# Patient Record
Sex: Male | Born: 1970 | Race: Black or African American | Hispanic: No | Marital: Single | Smoking: Current every day smoker
Health system: Southern US, Community
[De-identification: ages and names within clinical notes are randomized; demographics above are authoritative.]

## PROBLEM LIST (undated history)

## (undated) DIAGNOSIS — K219 Gastro-esophageal reflux disease without esophagitis: Secondary | ICD-10-CM

## (undated) DIAGNOSIS — E7800 Pure hypercholesterolemia, unspecified: Secondary | ICD-10-CM

## (undated) DIAGNOSIS — Z803 Family history of malignant neoplasm of breast: Secondary | ICD-10-CM

## (undated) DIAGNOSIS — E119 Type 2 diabetes mellitus without complications: Secondary | ICD-10-CM

## (undated) DIAGNOSIS — Z800 Family history of malignant neoplasm of digestive organs: Secondary | ICD-10-CM

## (undated) DIAGNOSIS — I10 Essential (primary) hypertension: Secondary | ICD-10-CM

## (undated) DIAGNOSIS — Z8042 Family history of malignant neoplasm of prostate: Secondary | ICD-10-CM

## (undated) DIAGNOSIS — M069 Rheumatoid arthritis, unspecified: Secondary | ICD-10-CM

## (undated) HISTORY — DX: Gastro-esophageal reflux disease without esophagitis: K21.9

## (undated) HISTORY — DX: Rheumatoid arthritis, unspecified: M06.9

## (undated) HISTORY — DX: Family history of malignant neoplasm of digestive organs: Z80.0

## (undated) HISTORY — DX: Family history of malignant neoplasm of prostate: Z80.42

## (undated) HISTORY — DX: Pure hypercholesterolemia, unspecified: E78.00

## (undated) HISTORY — PX: TOE AMPUTATION: SHX809

## (undated) HISTORY — DX: Family history of malignant neoplasm of breast: Z80.3

---

## 2004-07-25 ENCOUNTER — Emergency Department (HOSPITAL_COMMUNITY): Admission: EM | Admit: 2004-07-25 | Discharge: 2004-07-25 | Payer: Self-pay | Admitting: Emergency Medicine

## 2010-04-05 ENCOUNTER — Inpatient Hospital Stay: Payer: Self-pay | Admitting: Internal Medicine

## 2010-05-08 ENCOUNTER — Ambulatory Visit: Payer: Self-pay | Admitting: Nurse Practitioner

## 2010-05-08 ENCOUNTER — Encounter (INDEPENDENT_AMBULATORY_CARE_PROVIDER_SITE_OTHER): Payer: Self-pay | Admitting: Nurse Practitioner

## 2010-05-08 DIAGNOSIS — E119 Type 2 diabetes mellitus without complications: Secondary | ICD-10-CM | POA: Insufficient documentation

## 2010-05-08 DIAGNOSIS — I10 Essential (primary) hypertension: Secondary | ICD-10-CM | POA: Insufficient documentation

## 2010-05-08 DIAGNOSIS — E669 Obesity, unspecified: Secondary | ICD-10-CM | POA: Insufficient documentation

## 2010-05-08 LAB — CONVERTED CEMR LAB
Bilirubin Urine: NEGATIVE
Blood Glucose, Fingerstick: 85
Hgb A1c MFr Bld: 9.5 %
Specific Gravity, Urine: 1.01
Urobilinogen, UA: 0.2
WBC Urine, dipstick: NEGATIVE

## 2010-05-12 LAB — CONVERTED CEMR LAB
ALT: 29 units/L (ref 0–53)
AST: 17 units/L (ref 0–37)
Albumin: 4.4 g/dL (ref 3.5–5.2)
Alkaline Phosphatase: 129 units/L — ABNORMAL HIGH (ref 39–117)
Basophils Absolute: 0 10*3/uL (ref 0.0–0.1)
Basophils Relative: 0 % (ref 0–1)
Calcium: 9.5 mg/dL (ref 8.4–10.5)
Chloride: 103 meq/L (ref 96–112)
MCHC: 32.9 g/dL (ref 30.0–36.0)
Neutro Abs: 5.8 10*3/uL (ref 1.7–7.7)
Neutrophils Relative %: 60 % (ref 43–77)
Platelets: 358 10*3/uL (ref 150–400)
Potassium: 4.1 meq/L (ref 3.5–5.3)
RBC: 4.93 M/uL (ref 4.22–5.81)
RDW: 13.8 % (ref 11.5–15.5)
Sodium: 140 meq/L (ref 135–145)

## 2010-05-25 ENCOUNTER — Emergency Department: Payer: Self-pay | Admitting: Emergency Medicine

## 2010-06-05 ENCOUNTER — Ambulatory Visit
Admission: RE | Admit: 2010-06-05 | Discharge: 2010-06-05 | Payer: Self-pay | Source: Home / Self Care | Attending: Nurse Practitioner | Admitting: Nurse Practitioner

## 2010-06-05 DIAGNOSIS — F17200 Nicotine dependence, unspecified, uncomplicated: Secondary | ICD-10-CM | POA: Insufficient documentation

## 2010-06-05 LAB — CONVERTED CEMR LAB
Blood in Urine, dipstick: NEGATIVE
Protein, U semiquant: NEGATIVE
Urobilinogen, UA: 0.2
WBC Urine, dipstick: NEGATIVE

## 2010-06-06 ENCOUNTER — Ambulatory Visit
Admission: RE | Admit: 2010-06-06 | Discharge: 2010-06-06 | Payer: Self-pay | Source: Home / Self Care | Attending: Nurse Practitioner | Admitting: Nurse Practitioner

## 2010-06-06 ENCOUNTER — Encounter (INDEPENDENT_AMBULATORY_CARE_PROVIDER_SITE_OTHER): Payer: Self-pay | Admitting: Nurse Practitioner

## 2010-06-22 NOTE — Letter (Signed)
Summary: NUTRITION SUMMARY/SUSIE  NUTRITION SUMMARY/SUSIE   Imported By: Arta Bruce 06/16/2010 14:56:38  _____________________________________________________________________  External Attachment:    Type:   Image     Comment:   External Document

## 2010-06-22 NOTE — Assessment & Plan Note (Signed)
Summary: Diabetes/HTN   Vital Signs:  Patient profile:   40 year old male Weight:      324.0 pounds BMI:     45.35 Temp:     98.1 degrees F oral Pulse rate:   100 / minute Pulse rhythm:   regular Resp:     20 per minute BP sitting:   130 / 100  (left arm) Cuff size:   regular  Vitals Entered By: Levon Hedger (June 05, 2010 3:00 PM)  Nutrition Counseling: Patient's BMI is greater than 25 and therefore counseled on weight management options. CC: follow-up visit, Hypertension Management Is Patient Diabetic? Yes Pain Assessment Patient in pain? no      CBG Result 100 CBG Device ID A  Does patient need assistance? Functional Status Self care Ambulation Normal   CC:  follow-up visit and Hypertension Management.  History of Present Illness:  Pt into the office for f/u on diabetes. Pt presents today with all his medications that he got from [facility]  Pt went to the ER since his last visit. C/o numbness and tingling down his left leg Pt was concerned with blood clot so he went to the ER. Dx with sciatica. Rx for ibuprofen 800mg  by mouth given which relieved the symptoms  Diabetes Management History:      The patient is a 40 years old male who comes in for evaluation of DM Type 2.  He has not been enrolled in the "Diabetic Education Program".  He states understanding of dietary principles and is following his diet appropriately.  No sensory loss is reported.  Self foot exams are not being performed.  He is checking home blood sugars.  He says that he is not exercising regularly.        Hypoglycemic symptoms are not occurring.  No hyperglycemic symptoms are reported.  Other comments include: pt is checking his blood sugar at least twice per day.  He presents today with the log. Marland Kitchen    Hypertension History:      He denies headache, chest pain, and palpitations.  He notes no problems with any antihypertensive medication side effects.        Positive major  cardiovascular risk factors include diabetes, hypertension, and current tobacco user.  Negative major cardiovascular risk factors include male age less than 37 years old and no history of hyperlipidemia.        Further assessment for target organ damage reveals no history of ASHD, cardiac end-organ damage (CHF/LVH), stroke/TIA, peripheral vascular disease, renal insufficiency, or hypertensive retinopathy.      Habits & Providers  Alcohol-Tobacco-Diet     Alcohol drinks/day: <1     Alcohol Counseling: not indicated; use of alcohol is not excessive or problematic     Tobacco Status: current     Tobacco Counseling: to quit use of tobacco products     Cigarette Packs/Day: 10 cigs day     Year Started: age 30  Exercise-Depression-Behavior     Does Patient Exercise: no     Have you felt down or hopeless? no     Have you felt little pleasure in things? no     Depression Counseling: further diagnostic testing and/or other treatment is indicated     Drug Use: never  Medications Prior to Update: 1)  Metformin Hcl 1000 Mg Tabs (Metformin Hcl) .... One Tablet By Mouth Two Times A Day For Blood Sugar 2)  Humulin 70/30 70-30 % Susp (Insulin Isophane & Regular) .... 50 Units  Subcutaneously Two Times A Day 3)  Lisinopril 10 Mg Tabs (Lisinopril) .... One Tablet By Mouth Daily For Blood Pressure/kidneys 4)  Blood Glucose Meter  Kit (Blood Glucose Monitoring Suppl) .... Use To Check Blood Sugar Twice Daily 5)  Lancets  Misc (Lancets) .... Use To Check Blood Sugar Twice Daily 6)  Blood Glucose Monitor  Kit (Blood Glucose Monitoring Suppl) .... Use To Check Blood Sugar Daily For Blood Sugar  Current Medications (verified): 1)  Metformin Hcl 1000 Mg Tabs (Metformin Hcl) .... One Tablet By Mouth Two Times A Day For Blood Sugar 2)  Humulin 70/30 70-30 % Susp (Insulin Isophane & Regular) .... 50 Units Subcutaneously Two Times A Day 3)  Lisinopril 10 Mg Tabs (Lisinopril) .... One Tablet By Mouth Daily For  Blood Pressure/kidneys 4)  Blood Glucose Meter  Kit (Blood Glucose Monitoring Suppl) .... Use To Check Blood Sugar Twice Daily 5)  Lancets  Misc (Lancets) .... Use To Check Blood Sugar Twice Daily 6)  Blood Glucose Monitor  Kit (Blood Glucose Monitoring Suppl) .... Use To Check Blood Sugar Daily For Blood Sugar  Allergies (verified): No Known Drug Allergies  Review of Systems General:  Denies fever. CV:  Denies chest pain or discomfort. Resp:  Denies cough. GI:  Denies abdominal pain, nausea, and vomiting. MS:  Denies joint pain.  Physical Exam  General:  alert.   Head:  normocephalic.   Lungs:  normal breath sounds.   Heart:  normal rate and regular rhythm.   Abdomen:  normal bowel sounds.   Msk:  up to the exam table Neurologic:  alert & oriented X3.   Skin:  color normal.   Psych:  Oriented X3.     Impression & Recommendations:  Problem # 1:  DIABETES MELLITUS, TYPE II (ICD-250.00) Pt is doing well.  He presents today with blood sugar log - advise pt to keep checking two times a day preferably but aware that availability of testing strips may impair this will plan to titrate insulin according to plan as outlined   His updated medication list for this problem includes:    Metformin Hcl 1000 Mg Tabs (Metformin hcl) ..... One tablet by mouth two times a day for blood sugar    Humulin 70/30 70-30 % Susp (Insulin isophane & regular) .Marland KitchenMarland KitchenMarland KitchenMarland Kitchen 50 units subcutaneously two times a day    Lisinopril 10 Mg Tabs (Lisinopril) ..... One tablet by mouth daily for blood pressure/kidneys  Orders: Capillary Blood Glucose/CBG ([address]) UA Dipstick w/o Micro (manual) (60454)  Problem # 2:  HYPERTENSION, BENIGN ESSENTIAL (ICD-401.1) BP still slightly elevated.  Will not change meds.  Advised pt that elevation may be due in part due recent nicotine restart His updated medication list for this problem includes:    Lisinopril 10 Mg Tabs (Lisinopril) ..... One tablet by mouth daily for blood  pressure/kidneys  Problem # 3:  OBESITY (ICD-278.00) advised pt to restart exercise regimen  Problem # 4:  TOBACCO ABUSE (ICD-305.1) advised pt to stop smoking.  Complete Medication List: 1)  Metformin Hcl 1000 Mg Tabs (Metformin hcl) .... One tablet by mouth two times a day for blood sugar 2)  Humulin 70/30 70-30 % Susp (Insulin isophane & regular) .... 50 units subcutaneously two times a day 3)  Lisinopril 10 Mg Tabs (Lisinopril) .... One tablet by mouth daily for blood pressure/kidneys 4)  Blood Glucose Meter Kit (Blood glucose monitoring suppl) .... Use to check blood sugar twice daily 5)  Lancets Misc (Lancets) .Marland KitchenMarland KitchenMarland Kitchen  Use to check blood sugar twice daily 6)  Blood Glucose Monitor Kit (Blood glucose monitoring suppl) .... Use to check blood sugar daily for blood sugar 7)  Ibuprofen 800 Mg Tabs (Ibuprofen) .... One tablet by mouth two times a day as needed for pain  Diabetes Management Assessment/Plan:      His blood pressure goal is < 130/80.    Hypertension Assessment/Plan:      The patient's hypertensive risk group is category C: Target organ damage and/or diabetes.  Today's blood pressure is 130/100.  His blood pressure goal is < 130/80.  Patient Instructions: 1)  Blood pressure - Your blood pressure is still 130/100. 2)  Keep taking the lisinopril. 3)  Realize  that nicotine does affect pulse and blood presure. 4)  Diabetes - Your blood sugar log looks GREAT.  Keep up the good work! 5)  Decrease Insulin to 45 units two times a day - take the afternoon dose between 5 - 5:30.  Keep taking this for 2 week and record values.  If test strips and lancets are running short then alternate checking in the morning and at night. 6)  January 30th - if the majority of morning blood sugar values are less than 100 then decrease insulin by 5 more units to 40 units. 7)  February 13th - By this time blood sugars should have normalized to between 80-120 in the mornings.  If blood sugars are still  less than 100 then decrease to 40 units two times a day  8)  CALL THIS OFFICE IF YOU HAVE ANY QUESTIONS ON WHAT TO DO WITH YOUR INUSLIN 9)  Keep your appointment with Drucilla Schmidt on tomorrow.  10)  Follow up with provider in 3 months for diabetes. 11)  Bring blood sugar log with you.   Orders Added: 1)  Capillary Blood Glucose/CBG [82948] 2)  Est. Patient Level III [81191] 3)  UA Dipstick w/o Micro (manual) [81002]    Prevention & Chronic Care Immunizations   Influenza vaccine: given at [facility]  (04/12/2010)    Tetanus booster: Not documented    Pneumococcal vaccine: Not documented  Other Screening   Smoking status: current  (06/05/2010)  Diabetes Mellitus   HgbA1C: 9.5  (05/08/2010)    Eye exam: Not documented    Foot exam: yes  (05/08/2010)   High risk foot: Not documented   Foot care education: Not documented    Urine microalbumin/creatinine ratio: Not documented  Lipids   Total Cholesterol: Not documented   LDL: Not documented   LDL Direct: Not documented   HDL: Not documented   Triglycerides: Not documented  Hypertension   Last Blood Pressure: 130 / 100  (06/05/2010)   Serum creatinine: 1.13  (05/08/2010)   Serum potassium 4.1  (05/08/2010)  Self-Management Support :    Diabetes self-management support: Not documented    Hypertension self-management support: Not documented   Laboratory Results   Urine Tests  Date/Time Received: June 05, 2010 3:46 PM   Routine Urinalysis   Color: lt. yellow Glucose: negative   (Normal Range: Negative) Bilirubin: negative   (Normal Range: Negative) Ketone: negative   (Normal Range: Negative) Spec. Gravity: 1.015   (Normal Range: 1.003-1.035) Blood: negative   (Normal Range: Negative) pH: 6.0   (Normal Range: 5.0-8.0) Protein: negative   (Normal Range: Negative) Urobilinogen: 0.2   (Normal Range: 0-1) Nitrite: negative   (Normal Range: Negative) Leukocyte Esterace: negative   (Normal Range:  Negative)     Blood  Tests     CBG Random:: 100mg /dL

## 2010-06-22 NOTE — Assessment & Plan Note (Signed)
Summary: NEW - Diabetes   Vital Signs:  Patient profile:   40 year old male Height:      71 inches Weight:      321.8 pounds BMI:     45.04 Temp:     97.7 degrees F oral Pulse rate:   114 / minute Pulse rhythm:   regular Resp:     20 per minute BP sitting:   130 / 100  (left arm) Cuff size:   regular  Vitals Entered By: Levon Hedger (May 08, 2010 3:15 PM)  Nutrition Counseling: Patient's BMI is greater than 25 and therefore counseled on weight management options. CC: new pt...new diabetic, Hypertension Management Is Patient Diabetic? Yes Pain Assessment Patient in pain? no      CBG Result 85 CBG Device ID A  Does patient need assistance? Functional Status Self care Ambulation Normal   CC:  new pt...new diabetic and Hypertension Management.  History of Present Illness:  Pt into the office to establish care No previous PCP Recent ER visit on 04/05/2010  Pt went to the ER due to fatigue. He was dx with diabetes. He was started on an insulin drip for 3 days.   He was started on insulin which he has been taking twice per day He has a freestyle meter that as prescribed by [facility] and he completed his supply of strips but he called the company and they fedexed him some strips to last until this visit. He has been taking Humulin 70/30 50 units two times a day   Diabetes Management History:      The patient is a 40 years old male who comes in for evaluation of DM Type 2.  He has not been enrolled in the "Diabetic Education Program".  He states lack of understanding of dietary principles and is not following his diet appropriately.  No sensory loss is reported.  Self foot exams are not being performed.  He is checking home blood sugars.  He says that he is not exercising regularly.        Hypoglycemic symptoms are not occurring.  Hyperglycemic symptoms include polyuria.        No changes have been made to his treatment plan since last visit.     Hypertension History:      He denies headache, chest pain, and palpitations.  no current BP meds.        Positive major cardiovascular risk factors include diabetes, hypertension, and current tobacco user.  Negative major cardiovascular risk factors include male age less than 55 years old and no history of hyperlipidemia.        Further assessment for target organ damage reveals no history of ASHD, cardiac end-organ damage (CHF/LVH), stroke/TIA, peripheral vascular disease, renal insufficiency, or hypertensive retinopathy.     Habits & Providers  Alcohol-Tobacco-Diet     Alcohol drinks/day: <1     Alcohol Counseling: not indicated; use of alcohol is not excessive or problematic     Tobacco Status: current     Tobacco Counseling: to quit use of tobacco products     Cigarette Packs/Day: 10 cigs day     Year Started: age 18  Exercise-Depression-Behavior     Does Patient Exercise: no     Have you felt down or hopeless? no     Have you felt little pleasure in things? no     Depression Counseling: further diagnostic testing and/or other treatment is indicated     Drug Use: never  Medications Prior to Update: 1)  None  Current Medications (verified): 1)  Metformin Hcl 1000 Mg Tabs (Metformin Hcl) .... Take One Tablet By Mouth Twice Daily *udo Ariwodo, Crnp 2)  Humulin 70/30 70-30 % Susp (Insulin Isophane & Regular)  Allergies (verified): No Known Drug Allergies   Family History: mother - htn father - unknown 1 sister - sickle cell anemia 1 sister - healthy  Social History: no children tobacco - 10 cigs per day ETOH - beers on the weekend (quit 04/05/2010) Drug - noneSmoking Status:  current Packs/Day:  10 cigs day Drug Use:  never Does Patient Exercise:  no  Review of Systems General:  Denies fever. CV:  Denies chest pain or discomfort. Resp:  Denies cough. GI:  Denies abdominal pain, nausea, and vomiting.  Physical Exam  General:  alert.  obese Head:   normocephalic.   Lungs:  normal breath sounds.   Heart:  normal rate and regular rhythm.   Abdomen:  normal bowel sounds.   Msk:  up to the exam table Neurologic:  alert & oriented X3.   Skin:  color normal.   Psych:  Oriented X3.    Diabetes Management Exam:    Foot Exam (with socks and/or shoes not present):       Sensory-Monofilament:          Left foot: normal          Right foot: normal   Impression & Recommendations:  Problem # 1:  DIABETES MELLITUS, TYPE II (ICD-250.00) expected hgba1c to be elevated due to recent dx will continue current regimen and get pt started on diabetes education glucometer ordered for pt His updated medication list for this problem includes:    Metformin Hcl 1000 Mg Tabs (Metformin hcl) ..... One tablet by mouth two times a day for blood sugar    Humulin 70/30 70-30 % Susp (Insulin isophane & regular) .Marland KitchenMarland KitchenMarland KitchenMarland Kitchen 50 units subcutaneously two times a day    Lisinopril 10 Mg Tabs (Lisinopril) ..... One tablet by mouth daily for blood pressure/kidneys  Orders: Capillary Blood Glucose/CBG (47829) Hemoglobin A1C (83036) UA Dipstick w/o Micro (manual) (56213) T-Comprehensive Metabolic Panel (08657-84696) T-CBC w/Diff (29528-41324) T-TSH (40102-72536) Diabetic Clinic Referral (Diabetic) Rapid HIV  (64403)  Problem # 2:  HYPERTENSION, BENIGN ESSENTIAL (ICD-401.1) elevated today on office. will start lisinopril His updated medication list for this problem includes:    Lisinopril 10 Mg Tabs (Lisinopril) ..... One tablet by mouth daily for blood pressure/kidneys  Problem # 3:  OBESITY (ICD-278.00)  advised pt of the need to monitor diet and exercise  Orders: T-TSH (47425-95638) Diabetic Clinic Referral (Diabetic)  Complete Medication List: 1)  Metformin Hcl 1000 Mg Tabs (Metformin hcl) .... One tablet by mouth two times a day for blood sugar 2)  Humulin 70/30 70-30 % Susp (Insulin isophane & regular) .... 50 units subcutaneously two times a day 3)   Lisinopril 10 Mg Tabs (Lisinopril) .... One tablet by mouth daily for blood pressure/kidneys 4)  Blood Glucose Meter Kit (Blood glucose monitoring suppl) .... Use to check blood sugar twice daily 5)  Lancets Misc (Lancets) .... Use to check blood sugar twice daily 6)  Blood Glucose Monitor Kit (Blood glucose monitoring suppl) .... Use to check blood sugar daily for blood sugar  Diabetes Management Assessment/Plan:      His blood pressure goal is < 130/80.    Hypertension Assessment/Plan:      The patient's hypertensive risk group is category C: Target organ damage  and/or diabetes.  Today's blood pressure is 130/100.  His blood pressure goal is < 130/80.  Patient Instructions: 1)  Schedule an appointment with Drucilla Schmidt for diabetic education 2)  Diabetes - You will be prescribed a new glucose meter so that you can get refills on test strips and lancets and [facility] 3)  Insulin has been sent to [facility]. Keep taking twice daily. 4)  Blood pressure - Your blood pressure is elevated today. 5)  Start Lisinopril 10mg  by mouth daily.  This will help with both blood pressure and kidneys 6)  Follow up in 4 weeks with n.martin,fnp for diabetes. 7)  You will need cbg, u/a. Prescriptions: BLOOD GLUCOSE MONITOR  KIT (BLOOD GLUCOSE MONITORING SUPPL) Use to check blood sugar daily for blood sugar  #1 meter x 0   Entered and Authorized by:   Lehman Prom FNP   Signed by:   Lehman Prom FNP on 05/08/2010   Method used:   Faxed to ...       [facility] - [facility] (retail)       [address] [place], [place]  [address]       Ph: [phone]       Fax: [phone]   RxID:   [id number] LANCETS  MISC (LANCETS) Use to check blood sugar twice daily  #100 x 1   Entered and Authorized by:   Lehman Prom FNP   Signed by:   Lehman Prom FNP on 05/08/2010   Method used:   Faxed to ...       [facility] - [facility]  (retail)       [address] [place], [place]  [address]       Ph: [phone]       Fax: [phone]   RxID:   [id number] BLOOD GLUCOSE METER  KIT (BLOOD GLUCOSE MONITORING SUPPL) Use to check blood sugar twice daily  #1 meter x 0   Entered and Authorized by:   Lehman Prom FNP   Signed by:   Lehman Prom FNP on 05/08/2010   Method used:   Faxed to ...       [facility] - Pharmac (retail)       [address] [place], [place]  [address]       Ph: [phone]       Fax: [phone]   RxID:   [id number] LISINOPRIL 10 MG TABS (LISINOPRIL) One tablet by mouth daily for blood pressure/kidneys  #30 x 1   Entered and Authorized by:   Lehman Prom FNP   Signed by:   Lehman Prom FNP on 05/08/2010   Method used:   Faxed to ...       [facility] - [facility] (retail)       [address] [place], [place]  [address]       Ph: [phone]       Fax: [phone]   RxID:   [id number] HUMULIN 70/30 70-30 % SUSP (INSULIN ISOPHANE & REGULAR) 50 units subcutaneously two times a day  #1 month x 1   Entered and Authorized by:   Lehman Prom FNP   Signed by:   Lehman Prom FNP on 05/08/2010   Method used:   Faxed to ...       [facility] -  Pharmac (retail)       [address] [place], [place]  [address]       Ph: [phone]       Fax: [phone]   RxID:   [id number]    Orders Added: 1)  Capillary Blood Glucose/CBG [82948] 2)  New Patient Level III [99203] 3)  Hemoglobin A1C [83036] 4)  UA Dipstick w/o Micro (manual) [81002] 5)  T-Comprehensive Metabolic Panel [80053-22900] 6)  T-CBC w/Diff [66440-34742] 7)  T-TSH [59563-87564] 8)  Diabetic Clinic Referral [Diabetic] 9)  Rapid HIV  [92370]            Diabetic Foot Exam    10-g (5.07) Semmes-Weinstein Monofilament Test Performed by: Levon Hedger          Right  Foot          Left Foot Visual Inspection               Test Control      normal         normal Site 1         normal         normal Site 2         normal         normal Site 3         normal         normal Site 4         normal         normal Site 5         normal         normal Site 6         normal         normal Site 7         normal         normal Site 8         normal         normal Site 9         normal         normal Site 10         normal         normal  Impression      normal         normal   Laboratory Results   Urine Tests  Date/Time Received: May 08, 2010 5:34 PM   Routine Urinalysis   Color: lt. yellow Glucose: negative   (Normal Range: Negative) Bilirubin: negative   (Normal Range: Negative) Ketone: negative   (Normal Range: Negative) Spec. Gravity: 1.010   (Normal Range: 1.003-1.035) Blood: trace-intact   (Normal Range: Negative) pH: 6.0   (Normal Range: 5.0-8.0) Protein: negative   (Normal Range: Negative) Urobilinogen: 0.2   (Normal Range: 0-1) Nitrite: negative   (Normal Range: Negative) Leukocyte Esterace: negative   (Normal Range: Negative)     Blood Tests   Date/Time Received: May 08, 2010 4:11 PM   HGBA1C: 9.5%   (Normal Range: Non-Diabetic - 3-6%   Control Diabetic - 6-8%) CBG Random:: 85  Date/Time Received: May 08, 2010 4:36 PM   Other Tests  Rapid HIV: negative   Laboratory Results   Urine Tests    Routine Urinalysis   Color: lt. yellow Glucose: negative   (Normal Range: Negative) Bilirubin: negative   (Normal Range: Negative) Ketone: negative   (Normal Range: Negative) Spec. Gravity: 1.010   (Normal Range: 1.003-1.035) Blood: trace-intact   (  Normal Range: Negative) pH: 6.0   (Normal Range: 5.0-8.0) Protein: negative   (Normal Range: Negative) Urobilinogen: 0.2   (Normal Range: 0-1) Nitrite: negative   (Normal Range: Negative) Leukocyte Esterace: negative   (Normal Range: Negative)     Blood Tests      HGBA1C: 9.5%   (Normal Range: Non-Diabetic - 3-6%   Control Diabetic - 6-8%) CBG Random:: 85mg /dL    Other Tests  Rapid HIV: negative

## 2010-07-19 ENCOUNTER — Telehealth (INDEPENDENT_AMBULATORY_CARE_PROVIDER_SITE_OTHER): Payer: Self-pay | Admitting: Nurse Practitioner

## 2010-07-27 NOTE — Progress Notes (Signed)
Summary: Needs refills  Phone Note Call from Patient Call back at [phone]   Summary of Call: Needs refill of insulin and BP meds.  Refills completed -- pt. notified. Initial call taken by: Dutch Quint RN,  July 19, 2010 10:31 AM    Prescriptions: LISINOPRIL 10 MG TABS (LISINOPRIL) One tablet by mouth daily for blood pressure/kidneys  #30 x 1   Entered by:   Dutch Quint RN   Authorized by:   Lehman Prom FNP   Signed by:   Dutch Quint RN on 07/19/2010   Method used:   Faxed to ...       [facility] - [facility] (retail)       [address] [place], [place]  [address]       Ph: [phone]       Fax: [phone]   RxID:   [id number] HUMULIN 70/30 70-30 % SUSP (INSULIN ISOPHANE & REGULAR) 50 units subcutaneously two times a day  #1 month x 1   Entered by:   Dutch Quint RN   Authorized by:   Lehman Prom FNP   Signed by:   Dutch Quint RN on 07/19/2010   Method used:   Faxed to ...       [facility] - [facility] (retail)       [address] [place], [place]  [address]       Ph: [phone]       Fax: [phone]   RxID:   [id number]

## 2012-03-20 ENCOUNTER — Encounter (HOSPITAL_COMMUNITY): Payer: Self-pay | Admitting: Emergency Medicine

## 2012-03-20 ENCOUNTER — Emergency Department (INDEPENDENT_AMBULATORY_CARE_PROVIDER_SITE_OTHER)
Admission: EM | Admit: 2012-03-20 | Discharge: 2012-03-20 | Disposition: A | Discharge status: Home / Self Care | Payer: Self-pay | Source: Home / Self Care | Attending: Emergency Medicine | Admitting: Emergency Medicine

## 2012-03-20 DIAGNOSIS — I10 Essential (primary) hypertension: Secondary | ICD-10-CM

## 2012-03-20 DIAGNOSIS — Z760 Encounter for issue of repeat prescription: Secondary | ICD-10-CM

## 2012-03-20 HISTORY — DX: Type 2 diabetes mellitus without complications: E11.9

## 2012-03-20 MED ORDER — INSULIN ASPART PROT & ASPART (70-30 MIX) 100 UNIT/ML ~~LOC~~ SUSP: 45.0000 [IU] | Freq: Two times a day (BID) | SUBCUTANEOUS | Status: DC

## 2012-03-20 MED ORDER — LISINOPRIL 10 MG PO TABS: 10.0000 mg | ORAL_TABLET | Freq: Every day | ORAL | Status: DC

## 2012-03-20 MED ORDER — METFORMIN HCL 500 MG PO TABS: 500.0000 mg | ORAL_TABLET | Freq: Two times a day (BID) | ORAL | Status: DC

## 2012-03-20 NOTE — ED Notes (Signed)
Pt states that he was a [facility] pt. Pt was given 60 day supply of meds and is runny low, needs refill.  No concerns. Pt states blood sugar was 98 this a.m

## 2012-03-20 NOTE — ED Provider Notes (Signed)
History     CSN: [id number]  Arrival date & time 03/20/12  1101   First MD Initiated Contact with Patient 03/20/12 1101      Chief Complaint  Patient presents with  . Medication Refill    [facility] needs med. refill. no concerns    (Consider location/radiation/quality/duration/timing/severity/associated sxs/prior treatment) HPI Comments: Patient presents urgent care requesting medication refills as he is running out of his insulin metformin and blood pressure medicine. Patient denies any symptoms although admits he continues to smoke daily. Denies any polydipsia polyuria, weakness or polyphagia.   The history is provided by the patient.    Past Medical History  Diagnosis Date  . Diabetes mellitus without complication     History reviewed. No pertinent past surgical history.  Family History  Problem Relation Age of Onset  . Diabetes Other     History  Substance Use Topics  . Smoking status: Current Some Day Smoker -- 0.5 packs/day    Types: Cigarettes  . Smokeless tobacco: Not on file  . Alcohol Use: No      Review of Systems  Constitutional: Negative for fever, activity change, appetite change and fatigue.  Respiratory: Negative for shortness of breath.   Cardiovascular: Negative for chest pain.  Musculoskeletal: Negative for myalgias.  Neurological: Negative for dizziness.    Allergies  Review of patient's allergies indicates no known allergies.  Home Medications   Current Outpatient Rx  Name Route Sig Dispense Refill  . INSULIN ASPART PROT & ASPART (70-30) 100 UNIT/ML [place] SUSP Subcutaneous Inject 45 Units into the skin 2 (two) times daily with a meal. 10 mL 3  . LISINOPRIL 10 MG PO TABS Oral Take 1 tablet (10 mg total) by mouth daily. 30 tablet 3  . METFORMIN HCL 500 MG PO TABS Oral Take 1 tablet (500 mg total) by mouth 2 (two) times daily with a meal. 60 tablet 3    BP 129/95  Pulse 90  Temp 99.5 F (37.5 C) (Oral)  Resp 22  SpO2  99%  Physical Exam  Nursing note and vitals reviewed. Constitutional: He is oriented to person, place, and time. He is active.  Non-toxic appearance. He does not have a sickly appearance. He does not appear ill. No distress.  HENT:  Head: Normocephalic.  Eyes: Conjunctivae normal are normal.  Neck: No JVD present.  Cardiovascular: Normal rate.  Exam reveals no gallop and no friction rub.   No murmur heard. Pulmonary/Chest: Effort normal and breath sounds normal.  Neurological: He is alert and oriented to person, place, and time.  Skin: Skin is warm. He is not diaphoretic.    ED Course  Procedures (including critical care time)  Labs Reviewed - No data to display No results found.   1. Encounter for medication refill   2. Hypertension       MDM  Medication refill disappear today have discussed with Mr. Strothers available resource for him to establish continuity of care within both ambulatory clinics family practice and internal medicine your call. He was provided refills for 3 months of both metformin, lisinopril and insulin. Patient is asymptomatic during today's visit        Jimmie Molly, MD 03/20/12 1317

## 2012-07-20 ENCOUNTER — Emergency Department (INDEPENDENT_AMBULATORY_CARE_PROVIDER_SITE_OTHER)
Admission: EM | Admit: 2012-07-20 | Discharge: 2012-07-20 | Disposition: A | Discharge status: Home / Self Care | Payer: Self-pay | Source: Home / Self Care | Attending: Emergency Medicine | Admitting: Emergency Medicine

## 2012-07-20 ENCOUNTER — Encounter (HOSPITAL_COMMUNITY): Payer: Self-pay | Admitting: Emergency Medicine

## 2012-07-20 DIAGNOSIS — E119 Type 2 diabetes mellitus without complications: Secondary | ICD-10-CM

## 2012-07-20 DIAGNOSIS — I10 Essential (primary) hypertension: Secondary | ICD-10-CM

## 2012-07-20 NOTE — ED Provider Notes (Signed)
Medical screening examination/treatment/procedure(s) were performed by resident physician or non-physician practitioner and as supervising physician I was immediately available for consultation/collaboration.   KINDL,JAMES DOUGLAS MD.   James D Kindl, MD 07/20/12 1641 

## 2012-07-20 NOTE — ED Notes (Signed)
Pt here needing refill on medication. Insulin 40 units x 2 daily. Lisinopril 10 mg daily. Metformin x 2 daily. Pt voices no concerns.  Pt given information to adult clinic. Pt states that he just started a new job and is still waiting on insurance coverage.

## 2012-07-20 NOTE — ED Provider Notes (Signed)
History     CSN: [id number]  Arrival date & time 07/20/12  1119   First MD Initiated Contact with Patient 07/20/12 1143      Chief Complaint  Patient presents with  . Medication Refill    pt took last dose of meds this a.m     HPI Pt states he is here for refills of his Lisinopril, Metformin and Novolog. Pt admits he was here approx 3 months ago (03/20/2012) and rec'd refills of same meds with the understanding he would obtain PCP for ongoing management of his diabetes and HTN during that time. 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 Has no explanation as to why he has failed to obtained PCP as agreed upon in October. Denies any symptoms and states his CBG's have been running in the 80,s to 90,s. Past Medical History  Diagnosis Date  . Diabetes mellitus without complication     History reviewed. No pertinent past surgical history.  Family History  Problem Relation Age of Onset  . Diabetes Other     History  Substance Use Topics  . Smoking status: Current Some Day Smoker -- 0.50 packs/day    Types: Cigarettes  . Smokeless tobacco: Not on file  . Alcohol Use: No      Review of Systems  All other systems reviewed and are negative.    Allergies  Review of patient's allergies indicates no known allergies.  Home Medications   Current Outpatient Rx  Name  Route  Sig  Dispense  Refill  . insulin aspart protamine-insulin aspart (NOVOLOG 70/30) (70-30) 100 UNIT/ML injection   Subcutaneous   Inject 45 Units into the skin 2 (two) times daily with a meal.   10 mL   3   . lisinopril (PRINIVIL,ZESTRIL) 10 MG tablet   Oral   Take 1 tablet (10 mg total) by mouth daily.   30 tablet   3   . metFORMIN (GLUCOPHAGE) 500 MG tablet   Oral   Take 1 tablet (500 mg total) by mouth 2 (two) times daily with a meal.   60 tablet   3     BP 138/91  Pulse 88  Resp 20  SpO2 100%  Physical Exam   Constitutional: He is oriented to person, place, and time. He appears well-developed and well-nourished.  HENT:  Head: Normocephalic and atraumatic.  Eyes: Conjunctivae are normal.  Neck: Neck supple.  Cardiovascular: Normal rate and regular rhythm.   Pulmonary/Chest: Effort normal and breath sounds normal.  Musculoskeletal: Normal range of motion.  Neurological: He is alert and oriented to person, place, and time.  Skin: Skin is warm and dry.  Psychiatric: He has a normal mood and affect.    ED Course  Procedures (including critical care time)  Labs Reviewed - No data to display No results found.   No diagnosis found.    MDM  Pt here for refills of meds (Lisinopril, Metformin and Novolog. Was here 03/20/2012 and was given Rx refills for 3 months by Dr Ladon Applebaum with understanding he would obtain PCP during that time which he has failed to do. Discussed pt with Dr Artis Flock who recommended pt be referred to come to the adult clinic tomorrow as he will need labs and complete exam. Pt left after that discussion w/o waiting for d/c instructions so it is unclear if he plans to return tomorrow or not. He was  given information regarding who to contact and the open hours of the Adult Care Clinic.        Leanne Chang, NP 07/20/12 [phone]

## 2012-07-20 NOTE — ED Notes (Signed)
Per request of patient's provider I went to speak with him about the adult clinic.  He had already received the information about [facility].  He stated that he did not have time to "mess with them"  He states he has tried to call them before and no one answered.   I explained to patient the difference between the Adult Clinic and [facility].  I explained to patient they would be able to see him tomorrow and before the necessary blood work.  Patient still very agitated and argumentative.  Patient now concerned about his $50 co-pay.  I attempted to explain to patient when he was here tomorrow he could speak with someone about that.  He then left the room without allowing me to explain anything further to him.

## 2012-07-21 ENCOUNTER — Encounter (HOSPITAL_COMMUNITY): Payer: Self-pay

## 2012-07-21 ENCOUNTER — Emergency Department (HOSPITAL_COMMUNITY)
Admission: EM | Admit: 2012-07-21 | Discharge: 2012-07-21 | Disposition: A | Discharge status: Home / Self Care | Payer: Self-pay | Source: Home / Self Care

## 2012-07-21 DIAGNOSIS — I10 Essential (primary) hypertension: Secondary | ICD-10-CM

## 2012-07-21 DIAGNOSIS — E119 Type 2 diabetes mellitus without complications: Secondary | ICD-10-CM

## 2012-07-21 LAB — LIPID PANEL
Cholesterol: 150 mg/dL (ref 0–200)
LDL Cholesterol: 80 mg/dL (ref 0–99)
Total CHOL/HDL Ratio: 2.6 RATIO
VLDL: 12 mg/dL (ref 0–40)

## 2012-07-21 LAB — COMPREHENSIVE METABOLIC PANEL
ALT: 21 U/L (ref 0–53)
AST: 23 U/L (ref 0–37)
Albumin: 3.8 g/dL (ref 3.5–5.2)
Alkaline Phosphatase: 89 U/L (ref 39–117)
BUN: 16 mg/dL (ref 6–23)
Chloride: 101 mEq/L (ref 96–112)
Potassium: 4.3 mEq/L (ref 3.5–5.1)
Sodium: 138 mEq/L (ref 135–145)
Total Bilirubin: 0.4 mg/dL (ref 0.3–1.2)
Total Protein: 7.7 g/dL (ref 6.0–8.3)

## 2012-07-21 LAB — HEMOGLOBIN A1C: Hgb A1c MFr Bld: 5.7 % — ABNORMAL HIGH (ref <5.7)

## 2012-07-21 MED ORDER — INSULIN ASPART PROT & ASPART (70-30 MIX) 100 UNIT/ML ~~LOC~~ SUSP: 45.0000 [IU] | Freq: Two times a day (BID) | SUBCUTANEOUS | Status: DC

## 2012-07-21 MED ORDER — LISINOPRIL 10 MG PO TABS: 10.0000 mg | ORAL_TABLET | Freq: Every day | ORAL | Status: DC

## 2012-07-21 MED ORDER — METFORMIN HCL 500 MG PO TABS: 500.0000 mg | ORAL_TABLET | Freq: Two times a day (BID) | ORAL | Status: DC

## 2012-07-21 NOTE — ED Notes (Signed)
Follow up Medication refill 

## 2012-07-21 NOTE — ED Provider Notes (Signed)
History     CSN: [id number]  Arrival date & time 07/21/12  8540  42 year old male is here for followup of his diabetes, hypertension and for his medication refills.[facility] .Denies any symptoms and states his CBG's have been running in the 80,s to 90,s.denies any chest pain shortness of breath     Chief Complaint  Patient presents with  . Follow-up    (Consider location/radiation/quality/duration/timing/severity/associated sxs/prior treatment) HPI  Past Medical History  Diagnosis Date  . Diabetes mellitus without complication     History reviewed. No pertinent past surgical history.  Family History  Problem Relation Age of Onset  . Diabetes Other     History  Substance Use Topics  . Smoking status: Current Some Day Smoker -- 0.50 packs/day    Types: Cigarettes  . Smokeless tobacco: Not on file  . Alcohol Use: No      Review of Systems  Allergies  Review of patient's allergies indicates no known allergies.  Home Medications   Current Outpatient Rx  Name  Route  Sig  Dispense  Refill  . insulin aspart protamine-insulin aspart (NOVOLOG 70/30) (70-30) 100 UNIT/ML injection   Subcutaneous   Inject 45 Units into the skin 2 (two) times daily with a meal.   10 mL   6   . lisinopril (PRINIVIL,ZESTRIL) 10 MG tablet   Oral   Take 1 tablet (10 mg total) by mouth daily.   30 tablet   2   . metFORMIN (GLUCOPHAGE) 500 MG tablet   Oral   Take 1 tablet (500 mg total) by mouth 2 (two) times daily with a meal.   60 tablet   6     BP 133/78  Pulse 79  Temp(Src) 98.7 F (37.1 C) (Oral)  SpO2 100%  Physical Exam Constitutional: He is oriented to person, place, and time. He appears well-developed and well-nourished.  HENT:  Head: Normocephalic and atraumatic.  Eyes: Conjunctivae are normal.  Neck: Neck supple.  Cardiovascular: Normal rate and regular rhythm.  Pulmonary/Chest: Effort normal and breath sounds normal.  Musculoskeletal:  Normal range of motion.  Neurological: He is alert and oriented to person, place, and time.  Skin: Skin is warm and dry.  Psychiatric: He has a normal mood and affect.     ED Course  Procedures (including critical care time)  Labs Reviewed  HEMOGLOBIN A1C  COMPREHENSIVE METABOLIC PANEL  LIPID PANEL   No results found.   No diagnosis found.    MDM  Insulin-dependent diabetes-patient encouraged to continue monitoring CBG Hypertension   Plan #1refills of meds -Lisinopril, Metformin and Novolog #2 check hemoglobin A1c, CMP, lipid panel #3 followup in 2 months        Richarda Overlie, MD 07/21/12 1323

## 2012-08-07 NOTE — ED Notes (Signed)
Spoke with pharmacy insulin changed to novolin because the patient can not afford novolog ok per dr Thedore Mins

## 2012-12-02 ENCOUNTER — Other Ambulatory Visit: Payer: Self-pay | Admitting: Internal Medicine

## 2012-12-03 NOTE — Telephone Encounter (Signed)
Medication refill

## 2012-12-03 NOTE — Telephone Encounter (Signed)
Please call patient to make an appointment to come in for an office visit.  He was supposed to followup in 2 months for his appointment.     Rodney Langton, MD, CDE, FAAFP [facility] [place], [place]

## 2012-12-22 ENCOUNTER — Emergency Department: Payer: Self-pay | Admitting: Emergency Medicine

## 2014-04-22 ENCOUNTER — Ambulatory Visit: Payer: Self-pay | Admitting: Family Medicine

## 2014-04-22 LAB — RENAL FUNCTION PANEL
ALBUMIN: 3.6 g/dL (ref 3.4–5.0)
Anion Gap: 3 — ABNORMAL LOW (ref 7–16)
BUN: 19 mg/dL — ABNORMAL HIGH (ref 7–18)
CALCIUM: 8.8 mg/dL (ref 8.5–10.1)
CREATININE: 1.18 mg/dL (ref 0.60–1.30)
Chloride: 102 mmol/L (ref 98–107)
Co2: 29 mmol/L (ref 21–32)
Glucose: 202 mg/dL — ABNORMAL HIGH (ref 65–99)
Osmolality: 276 (ref 275–301)
PHOSPHORUS: 3.6 mg/dL (ref 2.5–4.9)
POTASSIUM: 4.1 mmol/L (ref 3.5–5.1)
SODIUM: 134 mmol/L — AB (ref 136–145)

## 2014-04-22 LAB — LIPID PANEL
Cholesterol: 197 mg/dL (ref 0–200)
HDL Cholesterol: 57 mg/dL (ref 40–60)
LDL CHOLESTEROL, CALC: 121 mg/dL — AB (ref 0–100)
TRIGLYCERIDES: 94 mg/dL (ref 0–200)
VLDL CHOLESTEROL, CALC: 19 mg/dL (ref 5–40)

## 2015-10-29 ENCOUNTER — Emergency Department
Admission: EM | Admit: 2015-10-29 | Discharge: 2015-10-29 | Disposition: A | Discharge status: Home / Self Care | Payer: Self-pay | Attending: Emergency Medicine | Admitting: Emergency Medicine

## 2015-10-29 ENCOUNTER — Encounter: Payer: Self-pay | Admitting: Emergency Medicine

## 2015-10-29 ENCOUNTER — Emergency Department: Payer: Self-pay

## 2015-10-29 DIAGNOSIS — E119 Type 2 diabetes mellitus without complications: Secondary | ICD-10-CM | POA: Insufficient documentation

## 2015-10-29 DIAGNOSIS — Z7984 Long term (current) use of oral hypoglycemic drugs: Secondary | ICD-10-CM | POA: Insufficient documentation

## 2015-10-29 DIAGNOSIS — S86912A Strain of unspecified muscle(s) and tendon(s) at lower leg level, left leg, initial encounter: Secondary | ICD-10-CM

## 2015-10-29 DIAGNOSIS — Z794 Long term (current) use of insulin: Secondary | ICD-10-CM | POA: Insufficient documentation

## 2015-10-29 DIAGNOSIS — F17210 Nicotine dependence, cigarettes, uncomplicated: Secondary | ICD-10-CM | POA: Insufficient documentation

## 2015-10-29 DIAGNOSIS — X501XXA Overexertion from prolonged static or awkward postures, initial encounter: Secondary | ICD-10-CM | POA: Insufficient documentation

## 2015-10-29 DIAGNOSIS — Y999 Unspecified external cause status: Secondary | ICD-10-CM | POA: Insufficient documentation

## 2015-10-29 DIAGNOSIS — Y939 Activity, unspecified: Secondary | ICD-10-CM | POA: Insufficient documentation

## 2015-10-29 DIAGNOSIS — Z79899 Other long term (current) drug therapy: Secondary | ICD-10-CM | POA: Insufficient documentation

## 2015-10-29 DIAGNOSIS — I10 Essential (primary) hypertension: Secondary | ICD-10-CM | POA: Insufficient documentation

## 2015-10-29 DIAGNOSIS — Y929 Unspecified place or not applicable: Secondary | ICD-10-CM | POA: Insufficient documentation

## 2015-10-29 DIAGNOSIS — S86812A Strain of other muscle(s) and tendon(s) at lower leg level, left leg, initial encounter: Secondary | ICD-10-CM | POA: Insufficient documentation

## 2015-10-29 HISTORY — DX: Essential (primary) hypertension: I10

## 2015-10-29 MED ORDER — MELOXICAM 15 MG PO TABS
15.0000 mg | ORAL_TABLET | Freq: Every day | ORAL | Status: DC
Start: 2015-10-29 — End: 2019-07-06

## 2015-10-29 NOTE — ED Notes (Signed)
L knee since twisted approx 1 week ago

## 2015-10-29 NOTE — Discharge Instructions (Signed)

## 2015-10-29 NOTE — ED Provider Notes (Signed)
[facility] Emergency Department Provider Note  ____________________________________________  Time seen: Approximately 12:21 PM  I have reviewed the triage vital signs and the nursing notes.   HISTORY  Chief Complaint Knee Pain    HPI Jeremiah Alexander is a 45 y.o. male who presents to emergency department complaining of pain to his left knee. Patient states that he was stepping off a curb 2-3 weeks ago when he twisted his ankle. He states in this incident he stepped off caudally on his left knee. Initially he was distracted by the injury to his ankle. He states that his ankle has healed but he continues to have anterior left knee pain. Patient states that he is able to ambulate but there is pain with ambulation. Pain is intermittent in nature, mild to moderate. He has not taken any medications for this symptom prior to arrival. No other complaints at this time.   Past Medical History  Diagnosis Date  . Diabetes mellitus without complication (HCC)   . Hypertension     Patient Active Problem List   Diagnosis Date Noted  . TOBACCO ABUSE 06/05/2010  . DIABETES MELLITUS, TYPE II 05/08/2010  . OBESITY 05/08/2010  . HYPERTENSION, BENIGN ESSENTIAL 05/08/2010    History reviewed. No pertinent past surgical history.  Current Outpatient Rx  Name  Route  Sig  Dispense  Refill  . insulin aspart protamine-insulin aspart (NOVOLOG 70/30) (70-30) 100 UNIT/ML injection   Subcutaneous   Inject 45 Units into the skin 2 (two) times daily with a meal.   10 mL   6   . lisinopril (PRINIVIL,ZESTRIL) 10 MG tablet      TAKE ONE TABLET BY MOUTH ONCE DAILY   30 tablet   0   . meloxicam (MOBIC) 15 MG tablet   Oral   Take 1 tablet (15 mg total) by mouth daily.   30 tablet   0   . metFORMIN (GLUCOPHAGE) 500 MG tablet   Oral   Take 1 tablet (500 mg total) by mouth 2 (two) times daily with a meal.   60 tablet   6     Allergies Review of patient's allergies  indicates no known allergies.  Family History  Problem Relation Age of Onset  . Diabetes Other     Social History Social History  Substance Use Topics  . Smoking status: Current Some Day Smoker -- 0.50 packs/day    Types: Cigarettes  . Smokeless tobacco: None  . Alcohol Use: No     Review of Systems  Constitutional: No fever/chills Cardiovascular: no chest pain. Respiratory: no cough. No SOB. Musculoskeletal: Positive for left knee pain Skin: Negative for rash, abrasions, lacerations, ecchymosis. Neurological: Negative for headaches, focal weakness or numbness. 10-point ROS otherwise negative.  ____________________________________________   PHYSICAL EXAM:  VITAL SIGNS: ED Triage Vitals  Enc Vitals Group     BP 10/29/15 1153 177/99 mmHg     Pulse Rate 10/29/15 1153 105     Resp 10/29/15 1153 20     Temp 10/29/15 1153 98.8 F (37.1 C)     Temp Source 10/29/15 1153 Oral     SpO2 10/29/15 1153 97 %     Weight 10/29/15 1153 280 lb (127.007 kg)     Height 10/29/15 1153 5\' 11"  (1.803 m)     Head Cir --      Peak Flow --      Pain Score 10/29/15 1155 7     Pain Loc --  Pain Edu? --      Excl. in GC? --      Constitutional: Alert and oriented. Well appearing and in no acute distress. Eyes: Conjunctivae are normal. PERRL. EOMI. Head: Atraumatic. Cardiovascular: Normal rate, regular rhythm. Normal S1 and S2.  Good peripheral circulation. Respiratory: Normal respiratory effort without tachypnea or retractions. Lungs CTAB. Good air entry to the bases with no decreased or absent breath sounds. Musculoskeletal: Full range of motion to all extremities. No gross deformities appreciated.No edema noted to left knee upon inspection. Patient is diffusely tender palpation over the anterior aspect of the knee over the patella. No palpable abnormality. No crepitus to palpation. Full range of motion and knee. Varus, valgus, Lachman's, McMurray's is negative. Dorsalis pedis pulses  intact distally. Sensation intact distally. Neurologic:  Normal speech and language. No gross focal neurologic deficits are appreciated.  Skin:  Skin is warm, dry and intact. No rash noted. Psychiatric: Mood and affect are normal. Speech and behavior are normal. Patient exhibits appropriate insight and judgement.   ____________________________________________   LABS (all labs ordered are listed, but only abnormal results are displayed)  Labs Reviewed - No data to display ____________________________________________  EKG   ____________________________________________  RADIOLOGY Festus Barren Graceanne Guin, personally viewed and evaluated these images (plain radiographs) as part of my medical decision making, as well as reviewing the written report by the radiologist  Dg Knee Complete 4 Views Left  10/29/2015  CLINICAL DATA:  Twisted left knee 1 week ago, still has anterior left knee pain when walking . No previous knee injury EXAM: LEFT KNEE - COMPLETE 4+ VIEW COMPARISON:  None. FINDINGS: No evidence of fracture, dislocation, or joint effusion. Small marginal spurs from the patellar articular surface. No other evidence of arthropathy or other focal bone abnormality. Patchy arterial calcifications near the adductor canal. Soft tissues are unremarkable. IMPRESSION: No acute bone abnormality. Small marginal patellar spurs. Arterial calcifications. Electronically Signed   By: Corlis Leak M.D.   On: 10/29/2015 12:18    ____________________________________________    PROCEDURES  Procedure(s) performed:       Medications - No data to display   ____________________________________________   INITIAL IMPRESSION / ASSESSMENT AND PLAN / ED COURSE  Pertinent labs & imaging results that were available during my care of the patient were reviewed by me and considered in my medical decision making (see chart for details).  Patient's diagnosis is consistent with left knee pain. X-ray reveals  some mild spurring over the patella. This is likely exacerbating a strain to the left knee. Exam is reassuring at this time. Patient will be discharged home with prescriptions for anti-inflammatories for symptom control. Patient is to follow up with orthopedics as needed or otherwise directed. Patient is given ED precautions to return to the ED for any worsening or new symptoms.     ____________________________________________  FINAL CLINICAL IMPRESSION(S) / ED DIAGNOSES  Final diagnoses:  Knee strain, left, initial encounter      NEW MEDICATIONS STARTED DURING THIS VISIT:  New Prescriptions   MELOXICAM (MOBIC) 15 MG TABLET    Take 1 tablet (15 mg total) by mouth daily.        This chart was dictated using voice recognition software/Dragon. Despite best efforts to proofread, errors can occur which can change the meaning. Any change was purely unintentional.    Racheal Patches, PA-C 10/29/15 1232  Governor Rooks, MD 10/29/15 [phone]

## 2019-07-06 ENCOUNTER — Encounter: Payer: Self-pay | Admitting: Emergency Medicine

## 2019-07-06 ENCOUNTER — Other Ambulatory Visit: Payer: Self-pay

## 2019-07-06 ENCOUNTER — Emergency Department: Payer: Self-pay

## 2019-07-06 ENCOUNTER — Emergency Department
Admission: EM | Admit: 2019-07-06 | Discharge: 2019-07-06 | Disposition: A | Discharge status: Home / Self Care | Payer: Self-pay | Attending: Student | Admitting: Student

## 2019-07-06 DIAGNOSIS — W182XXA Fall in (into) shower or empty bathtub, initial encounter: Secondary | ICD-10-CM | POA: Insufficient documentation

## 2019-07-06 DIAGNOSIS — Y93E1 Activity, personal bathing and showering: Secondary | ICD-10-CM | POA: Insufficient documentation

## 2019-07-06 DIAGNOSIS — E119 Type 2 diabetes mellitus without complications: Secondary | ICD-10-CM | POA: Insufficient documentation

## 2019-07-06 DIAGNOSIS — F17210 Nicotine dependence, cigarettes, uncomplicated: Secondary | ICD-10-CM | POA: Insufficient documentation

## 2019-07-06 DIAGNOSIS — I10 Essential (primary) hypertension: Secondary | ICD-10-CM | POA: Insufficient documentation

## 2019-07-06 DIAGNOSIS — Y999 Unspecified external cause status: Secondary | ICD-10-CM | POA: Insufficient documentation

## 2019-07-06 DIAGNOSIS — Z794 Long term (current) use of insulin: Secondary | ICD-10-CM | POA: Insufficient documentation

## 2019-07-06 DIAGNOSIS — Y92002 Bathroom of unspecified non-institutional (private) residence single-family (private) house as the place of occurrence of the external cause: Secondary | ICD-10-CM | POA: Insufficient documentation

## 2019-07-06 DIAGNOSIS — Z79899 Other long term (current) drug therapy: Secondary | ICD-10-CM | POA: Insufficient documentation

## 2019-07-06 DIAGNOSIS — S20212A Contusion of left front wall of thorax, initial encounter: Secondary | ICD-10-CM | POA: Insufficient documentation

## 2019-07-06 MED ORDER — OXYCODONE-ACETAMINOPHEN 7.5-325 MG PO TABS: 1.0000 | ORAL_TABLET | Freq: Four times a day (QID) | ORAL | 0 refills | Status: DC | PRN

## 2019-07-06 MED ORDER — IBUPROFEN 600 MG PO TABS: 600.0000 mg | ORAL_TABLET | Freq: Three times a day (TID) | ORAL | 0 refills | Status: DC | PRN

## 2019-07-06 MED ORDER — OXYCODONE-ACETAMINOPHEN 5-325 MG PO TABS
1.0000 | ORAL_TABLET | Freq: Once | ORAL | Status: AC
  Administered 2019-07-06: 1 via ORAL
  Filled 2019-07-06: qty 1

## 2019-07-06 MED ORDER — IBUPROFEN 600 MG PO TABS
600.0000 mg | ORAL_TABLET | Freq: Once | ORAL | Status: AC
  Administered 2019-07-06: 600 mg via ORAL
  Filled 2019-07-06: qty 1

## 2019-07-06 MED ORDER — LIDOCAINE 5 % EX PTCH
1.0000 | MEDICATED_PATCH | CUTANEOUS | Status: DC
Start: 2019-07-06 — End: 2019-07-06
  Administered 2019-07-06: 1 via TRANSDERMAL
  Filled 2019-07-06: qty 1

## 2019-07-06 NOTE — ED Provider Notes (Signed)
ED ECG REPORT I, Sharyn Creamer, the attending physician, personally viewed and interpreted this ECG.  Date: 07/06/2019 EKG Time: 1210 Rate: 90 Rhythm: normal sinus rhythm QRS Axis: normal Intervals: normal ST/T Wave abnormalities: normal Narrative Interpretation: no evidence of acute ischemia    Sharyn Creamer, MD 07/06/19 1206

## 2019-07-06 NOTE — Discharge Instructions (Signed)
Wear Lidoderm patch for 12 hours.  Follow discharge care instruction take medication as directed. 

## 2019-07-06 NOTE — ED Provider Notes (Signed)
[facility] Emergency Department Provider Note   ____________________________________________   First MD Initiated Contact with Patient 07/06/19 1218     (approximate)  I have reviewed the triage vital signs and the nursing notes.   HISTORY  Chief Complaint Rib Injury    HPI Jeremiah Alexander is a 49 y.o. male patient complain of left lateral rib pain secondary to a slip and fall in the shower yesterday.  Patient the pain increased with movement, position change, deep breaths.  Patient rates pain as a 7/10.  Patient described pain as "achy".  No palliative measures prior to arrival.  It was noted patient blood pressure elevated in triage.  Patient has not taken his blood pressure diabetic medicine this morning.         Past Medical History:  Diagnosis Date  . Diabetes mellitus without complication ([place])   . Hypertension     Patient Active Problem List   Diagnosis Date Noted  . TOBACCO ABUSE 06/05/2010  . DIABETES MELLITUS, TYPE II 05/08/2010  . OBESITY 05/08/2010  . HYPERTENSION, BENIGN ESSENTIAL 05/08/2010    Past Surgical History:  Procedure Laterality Date  . TOE AMPUTATION     Right Pinky Toe    Prior to Admission medications   Medication Sig Start Date End Date Taking? Authorizing Provider  ibuprofen (ADVIL) 600 MG tablet Take 1 tablet (600 mg total) by mouth every 8 (eight) hours as needed. 07/06/19   Sable Feil, PA-C  insulin aspart protamine-insulin aspart (NOVOLOG 70/30) (70-30) 100 UNIT/ML injection Inject 45 Units into the skin 2 (two) times daily with a meal. 07/21/12   Reyne Dumas, MD  lisinopril (PRINIVIL,ZESTRIL) 10 MG tablet TAKE ONE TABLET BY MOUTH ONCE DAILY Patient taking differently: 20 mg.  12/02/12   Murlean Iba, MD  metFORMIN (GLUCOPHAGE) 500 MG tablet Take 1 tablet (500 mg total) by mouth 2 (two) times daily with a meal. 07/21/12   Reyne Dumas, MD  oxyCODONE-acetaminophen (PERCOCET) 7.5-325 MG tablet Take 1  tablet by mouth every 6 (six) hours as needed. 07/06/19   Sable Feil, PA-C    Allergies Patient has no known allergies.  Family History  Problem Relation Age of Onset  . Diabetes Other     Social History Social History   Tobacco Use  . Smoking status: Current Some Day Smoker    Packs/day: 0.50    Types: Cigarettes  . Smokeless tobacco: Never Used  Substance Use Topics  . Alcohol use: No  . Drug use: Not on file    Review of Systems  Constitutional: No fever/chills Eyes: No visual changes. ENT: No sore throat. Cardiovascular: Denies chest pain. Respiratory: Denies shortness of breath. Gastrointestinal: No abdominal pain.  No nausea, no vomiting.  No diarrhea.  No constipation. Genitourinary: Negative for dysuria. Musculoskeletal: Left lateral rib pain.   Skin: Negative for rash. Neurological: Negative for headaches, focal weakness or numbness. Endocrine:  Diabetes and hypertension. ____________________________________________   PHYSICAL EXAM:  VITAL SIGNS: ED Triage Vitals  Enc Vitals Group     BP 07/06/19 1158 (!) 213/118     Pulse Rate 07/06/19 1158 98     Resp 07/06/19 1158 19     Temp 07/06/19 1158 99.2 F (37.3 C)     Temp Source 07/06/19 1158 Oral     SpO2 07/06/19 1158 99 %     Weight 07/06/19 1158 280 lb (127 kg)     Height 07/06/19 1158 6' (1.829 m)  Head Circumference --      Peak Flow --      Pain Score 07/06/19 1200 7     Pain Loc --      Pain Edu? --      Excl. in GC? --     Constitutional: Alert and oriented. Well appearing and in no acute distress. Neck: No cervical spine tenderness to palpation. Hematological/Lymphatic/Immunilogical: No cervical lymphadenopathy. Cardiovascular: Normal rate, regular rhythm. Grossly normal heart sounds.  Good peripheral circulation.  Elevated blood pressure. Respiratory: Normal respiratory effort.  No retractions. Lungs CTAB. Gastrointestinal: Soft and nontender. No distention. No abdominal  bruits. No CVA tenderness. Musculoskeletal: No obvious chest wall deformity.  Patient is moderate guarding palpation left lateral ribs 8, 9, and 10.  Neurologic:  Normal speech and language. No gross focal neurologic deficits are appreciated. No gait instability. Skin:  Skin is warm, dry and intact. No rash noted.  No abrasion or ecchymosis to the chest wall. Psychiatric: Mood and affect are normal. Speech and behavior are normal.  ____________________________________________   LABS (all labs ordered are listed, but only abnormal results are displayed)  Labs Reviewed - No data to display ____________________________________________  EKG   ____________________________________________  RADIOLOGY  ED MD interpretation:    Official radiology report(s): DG Ribs Unilateral W/Chest Left  Result Date: 07/06/2019 CLINICAL DATA:  Pain following fall EXAM: LEFT RIBS AND CHEST - 3+ VIEW COMPARISON:  Chest radiograph December 22, 2012 FINDINGS: Frontal chest as well as oblique and cone-down rib images were obtained. Lungs are clear. Heart size and pulmonary vascularity are normal. No adenopathy. There is aortic atherosclerosis. No evident pneumothorax or pleural effusion. No appreciable rib fracture. IMPRESSION: No evident rib fracture. Lungs clear. Aortic Atherosclerosis (ICD10-I70.0). Electronically Signed   By: Bretta Bang III M.D.   On: 07/06/2019 12:59    ____________________________________________   PROCEDURES  Procedure(s) performed (including Critical Care):  Procedures   ____________________________________________   INITIAL IMPRESSION / ASSESSMENT AND PLAN / ED COURSE  As part of my medical decision making, I reviewed the following data within the electronic MEDICAL RECORD NUMBER     Patient presents with left lateral rib pain secondary to fall.  Discussed neck x-ray findings with patient.  Physical exam consistent with rib contusion.  Patient given discharge care instruction  advised take medication as directed.  Patient vies follow-up PCP.    Jeremiah Alexander was evaluated in Emergency Department on 07/06/2019 for the symptoms described in the history of present illness. He was evaluated in the context of the global COVID-19 pandemic, which necessitated consideration that the patient might be at risk for infection with the SARS-CoV-2 virus that causes COVID-19. Institutional protocols and algorithms that pertain to the evaluation of patients at risk for COVID-19 are in a state of rapid change based on information released by regulatory bodies including the CDC and federal and state organizations. These policies and algorithms were followed during the patient's care in the ED.       ____________________________________________   FINAL CLINICAL IMPRESSION(S) / ED DIAGNOSES  Final diagnoses:  Rib contusion, left, initial encounter     ED Discharge Orders         Ordered    oxyCODONE-acetaminophen (PERCOCET) 7.5-325 MG tablet  Every 6 hours PRN     07/06/19 1332    ibuprofen (ADVIL) 600 MG tablet  Every 8 hours PRN     07/06/19 1332           Note:  This  document was prepared using Conservation officer, historic buildings and may include unintentional dictation errors.    Joni Reining, PA-C 07/06/19 1638    Miguel Aschoff., MD 07/06/19 1740

## 2019-07-06 NOTE — ED Notes (Signed)
See triage note  States he fell in shower yesterday  States he hit his left lateral ribs on tub  Having increased pain with movement and inspiration

## 2019-07-06 NOTE — ED Triage Notes (Signed)
Pt presents to ED via POV with c/o L ribcage injury, pt states slipped in shower and fell yesterday landing on L ribs. Pt states hx of HTN, takes Lisinopril, last tooks his meds earlier today.   Pt states pain in L sided rib cage worse with movement and position changes.

## 2019-10-12 ENCOUNTER — Emergency Department
Admission: EM | Admit: 2019-10-12 | Discharge: 2019-10-12 | Disposition: A | Discharge status: Home / Self Care | Payer: Self-pay | Attending: Emergency Medicine | Admitting: Emergency Medicine

## 2019-10-12 ENCOUNTER — Other Ambulatory Visit: Payer: Self-pay

## 2019-10-12 ENCOUNTER — Encounter: Payer: Self-pay | Admitting: Emergency Medicine

## 2019-10-12 ENCOUNTER — Emergency Department: Payer: Self-pay

## 2019-10-12 DIAGNOSIS — Z794 Long term (current) use of insulin: Secondary | ICD-10-CM | POA: Insufficient documentation

## 2019-10-12 DIAGNOSIS — E119 Type 2 diabetes mellitus without complications: Secondary | ICD-10-CM | POA: Insufficient documentation

## 2019-10-12 DIAGNOSIS — R079 Chest pain, unspecified: Secondary | ICD-10-CM

## 2019-10-12 DIAGNOSIS — F17210 Nicotine dependence, cigarettes, uncomplicated: Secondary | ICD-10-CM | POA: Insufficient documentation

## 2019-10-12 DIAGNOSIS — R0789 Other chest pain: Secondary | ICD-10-CM | POA: Insufficient documentation

## 2019-10-12 DIAGNOSIS — I10 Essential (primary) hypertension: Secondary | ICD-10-CM | POA: Insufficient documentation

## 2019-10-12 DIAGNOSIS — Z79899 Other long term (current) drug therapy: Secondary | ICD-10-CM | POA: Insufficient documentation

## 2019-10-12 LAB — TROPONIN I (HIGH SENSITIVITY)
Troponin I (High Sensitivity): 5 ng/L (ref <18)
Troponin I (High Sensitivity): 4 ng/L (ref <18)

## 2019-10-12 LAB — CBC
HCT: 44.6 % (ref 39.0–52.0)
Hemoglobin: 15.4 g/dL (ref 13.0–17.0)
MCH: 31.5 pg (ref 26.0–34.0)
MCHC: 34.5 g/dL (ref 30.0–36.0)
MCV: 91.2 fL (ref 80.0–100.0)
Platelets: 263 10*3/uL (ref 150–400)
RBC: 4.89 MIL/uL (ref 4.22–5.81)
RDW: 12.8 % (ref 11.5–15.5)
WBC: 7.5 10*3/uL (ref 4.0–10.5)
nRBC: 0 % (ref 0.0–0.2)

## 2019-10-12 LAB — BASIC METABOLIC PANEL
Anion gap: 11 (ref 5–15)
BUN: 13 mg/dL (ref 6–20)
CO2: 27 mmol/L (ref 22–32)
Calcium: 9.1 mg/dL (ref 8.9–10.3)
Chloride: 101 mmol/L (ref 98–111)
Creatinine, Ser: 0.91 mg/dL (ref 0.61–1.24)
GFR calc Af Amer: >60 mL/min (ref >60)
GFR calc non Af Amer: >60 mL/min (ref >60)
Glucose, Bld: 117 mg/dL — ABNORMAL HIGH (ref 70–99)
Potassium: 4.5 mmol/L (ref 3.5–5.1)
Sodium: 139 mmol/L (ref 135–145)

## 2019-10-12 MED ORDER — HYDROCODONE-ACETAMINOPHEN 5-325 MG PO TABS: 1.0000 | ORAL_TABLET | ORAL | 0 refills | Status: DC | PRN

## 2019-10-12 MED ORDER — SODIUM CHLORIDE 0.9% FLUSH: 3.0000 mL | Freq: Once | INTRAVENOUS | Status: DC

## 2019-10-12 MED ORDER — OXYCODONE-ACETAMINOPHEN 5-325 MG PO TABS
1.0000 | ORAL_TABLET | Freq: Once | ORAL | Status: AC
  Administered 2019-10-12: 1 via ORAL
  Filled 2019-10-12: qty 1

## 2019-10-12 NOTE — ED Notes (Signed)
NAD noted at time of D/C. Pt denies questions or concerns. Pt ambulatory to the lobby at this time. EDP aware of BP at time of D/C, states okay for D/C. Driving precautions with pain medications reviewed with patient and SO. Pt refused wheelchair to lobby at this time.

## 2019-10-12 NOTE — ED Provider Notes (Signed)
[facility] Emergency Department Provider Note  Time seen: 5:21 PM  I have reviewed the triage vital signs and the nursing notes.   HISTORY  Chief Complaint Chest Pain   HPI Jeremiah Alexander is a 49 y.o. male with a past medical history of diabetes, hypertension, presents to the emergency department for chest pain.  According to the patient for the past 2 days he has been experiencing pain in his left chest.  Denies any shortness of breath nausea or diaphoresis.  Patient states the pain is worse with movement or if he pushes on the area.  No leg pain.  Largely negative review of systems otherwise.  Does state the pain is moderate and sharp especially with movement.   Past Medical History:  Diagnosis Date  . Diabetes mellitus without complication ([place])   . Hypertension     Patient Active Problem List   Diagnosis Date Noted  . TOBACCO ABUSE 06/05/2010  . DIABETES MELLITUS, TYPE II 05/08/2010  . OBESITY 05/08/2010  . HYPERTENSION, BENIGN ESSENTIAL 05/08/2010    Past Surgical History:  Procedure Laterality Date  . TOE AMPUTATION     Right Pinky Toe    Prior to Admission medications   Medication Sig Start Date End Date Taking? Authorizing Provider  ibuprofen (ADVIL) 600 MG tablet Take 1 tablet (600 mg total) by mouth every 8 (eight) hours as needed. 07/06/19   Sable Feil, PA-C  insulin aspart protamine-insulin aspart (NOVOLOG 70/30) (70-30) 100 UNIT/ML injection Inject 45 Units into the skin 2 (two) times daily with a meal. 07/21/12   Reyne Dumas, MD  lisinopril (PRINIVIL,ZESTRIL) 10 MG tablet TAKE ONE TABLET BY MOUTH ONCE DAILY Patient taking differently: 20 mg.  12/02/12   Murlean Iba, MD  metFORMIN (GLUCOPHAGE) 500 MG tablet Take 1 tablet (500 mg total) by mouth 2 (two) times daily with a meal. 07/21/12   Reyne Dumas, MD  oxyCODONE-acetaminophen (PERCOCET) 7.5-325 MG tablet Take 1 tablet by mouth every 6 (six) hours as needed. 07/06/19   Sable Feil, PA-C    No Known Allergies  Family History  Problem Relation Age of Onset  . Diabetes Other     Social History Social History   Tobacco Use  . Smoking status: Current Some Day Smoker    Packs/day: 0.50    Types: Cigarettes  . Smokeless tobacco: Never Used  Substance Use Topics  . Alcohol use: No  . Drug use: Not on file    Review of Systems Constitutional: Negative for fever. Cardiovascular: Positive for left chest wall pain. Respiratory: Negative for shortness of breath.  Negative for cough. Gastrointestinal: Negative for abdominal pain, vomiting  Musculoskeletal: States tenderness left chest wall. Skin: Negative for skin complaints  Neurological: Negative for headache All other ROS negative  ____________________________________________   PHYSICAL EXAM:  VITAL SIGNS: ED Triage Vitals  Enc Vitals Group     BP 10/12/19 1445 (!) 190/101     Pulse Rate 10/12/19 1445 91     Resp 10/12/19 1445 16     Temp 10/12/19 1445 98.6 F (37 C)     Temp Source 10/12/19 1445 Oral     SpO2 10/12/19 1445 100 %     Weight 10/12/19 1442 279 lb 15.8 oz (127 kg)     Height 10/12/19 1442 6' (1.829 m)     Head Circumference --      Peak Flow --      Pain Score 10/12/19 1442 2  Pain Loc --      Pain Edu? --      Excl. in GC? --     Constitutional: Alert and oriented. Well appearing and in no distress. Eyes: Normal exam ENT      Head: Normocephalic and atraumatic.      Mouth/Throat: Mucous membranes are moist. Cardiovascular: Normal rate, regular rhythm.  Respiratory: Normal respiratory effort without tachypnea nor retractions. Breath sounds are clear.  Significant left chest wall tenderness to palpation very reproducible. Gastrointestinal: Soft and nontender. No distention.  Musculoskeletal: Nontender with normal range of motion in all extremities.  Neurologic:  Normal speech and language. No gross focal neurologic deficits Skin:  Skin is warm, dry and intact.   Psychiatric: Mood and affect are normal.   ____________________________________________    EKG  EKG viewed and interpreted by myself shows a normal sinus rhythm 86 bpm with a narrow QRS, normal axis, normal intervals, no concerning ST changes.  ____________________________________________    RADIOLOGY  Chest x-ray is negative.  ____________________________________________   INITIAL IMPRESSION / ASSESSMENT AND PLAN / ED COURSE  Pertinent labs & imaging results that were available during my care of the patient were reviewed by me and considered in my medical decision making (see chart for details).   Patient presents to the emergency department for 2 days of left chest pain.  Pain is extremely reproducible on examination highly suspect musculoskeletal/chest wall pain.  Patient's lab work is thus far reassuring including a negative troponin.  Chest x-ray is reassuring and normal-appearing EKG.  Remainder the lab work is largely within normal limits as well.  We will repeat a troponin.  If the patient's repeat troponin is negative anticipate likely discharge home with PCP follow-up.  Patient agreeable to plan of care.  Repeat troponin remains negative.  Patient appears overall well.  We will discharge the patient home with a short course of pain medication for musculoskeletal discomfort.  I discussed return precautions as well as cardiology follow-up.  Jeremiah Alexander was evaluated in Emergency Department on 10/12/2019 for the symptoms described in the history of present illness. He was evaluated in the context of the global COVID-19 pandemic, which necessitated consideration that the patient might be at risk for infection with the SARS-CoV-2 virus that causes COVID-19. Institutional protocols and algorithms that pertain to the evaluation of patients at risk for COVID-19 are in a state of rapid change based on information released by regulatory bodies including the CDC and federal and state  organizations. These policies and algorithms were followed during the patient's care in the ED.  ____________________________________________   FINAL CLINICAL IMPRESSION(S) / ED DIAGNOSES  Chest wall pain   Minna Antis, MD 10/12/19 1806

## 2019-10-12 NOTE — ED Triage Notes (Signed)
Left upper chest pain x 2 days.  States movement makes pain worse.  Denies injury.    AAOx3.  Skin warm and dry.  No SOB/ DOE.  NAD

## 2021-02-08 ENCOUNTER — Encounter: Payer: Self-pay | Admitting: Emergency Medicine

## 2021-02-08 ENCOUNTER — Emergency Department: Payer: Self-pay

## 2021-02-08 ENCOUNTER — Emergency Department
Admission: EM | Admit: 2021-02-08 | Discharge: 2021-02-08 | Disposition: A | Discharge status: Home / Self Care | Payer: Self-pay | Attending: Emergency Medicine | Admitting: Emergency Medicine

## 2021-02-08 ENCOUNTER — Other Ambulatory Visit: Payer: Self-pay

## 2021-02-08 DIAGNOSIS — Z7984 Long term (current) use of oral hypoglycemic drugs: Secondary | ICD-10-CM | POA: Insufficient documentation

## 2021-02-08 DIAGNOSIS — Z79899 Other long term (current) drug therapy: Secondary | ICD-10-CM | POA: Insufficient documentation

## 2021-02-08 DIAGNOSIS — M25512 Pain in left shoulder: Secondary | ICD-10-CM | POA: Insufficient documentation

## 2021-02-08 DIAGNOSIS — F17210 Nicotine dependence, cigarettes, uncomplicated: Secondary | ICD-10-CM | POA: Insufficient documentation

## 2021-02-08 DIAGNOSIS — Z794 Long term (current) use of insulin: Secondary | ICD-10-CM | POA: Insufficient documentation

## 2021-02-08 DIAGNOSIS — M25531 Pain in right wrist: Secondary | ICD-10-CM | POA: Insufficient documentation

## 2021-02-08 DIAGNOSIS — R202 Paresthesia of skin: Secondary | ICD-10-CM | POA: Insufficient documentation

## 2021-02-08 DIAGNOSIS — M25532 Pain in left wrist: Secondary | ICD-10-CM | POA: Insufficient documentation

## 2021-02-08 DIAGNOSIS — E119 Type 2 diabetes mellitus without complications: Secondary | ICD-10-CM | POA: Insufficient documentation

## 2021-02-08 DIAGNOSIS — I10 Essential (primary) hypertension: Secondary | ICD-10-CM | POA: Insufficient documentation

## 2021-02-08 LAB — CBC WITH DIFFERENTIAL/PLATELET
Abs Immature Granulocytes: 0.02 10*3/uL (ref 0.00–0.07)
Basophils Absolute: 0 10*3/uL (ref 0.0–0.1)
Basophils Relative: 1 %
Eosinophils Absolute: 0.2 10*3/uL (ref 0.0–0.5)
Eosinophils Relative: 2 %
HCT: 41.5 % (ref 39.0–52.0)
Hemoglobin: 14.7 g/dL (ref 13.0–17.0)
Immature Granulocytes: 0 %
Lymphocytes Relative: 17 %
Lymphs Abs: 1.5 10*3/uL (ref 0.7–4.0)
MCH: 32 pg (ref 26.0–34.0)
MCHC: 35.4 g/dL (ref 30.0–36.0)
MCV: 90.4 fL (ref 80.0–100.0)
Monocytes Absolute: 0.7 10*3/uL (ref 0.1–1.0)
Monocytes Relative: 9 %
Neutro Abs: 6.1 10*3/uL (ref 1.7–7.7)
Neutrophils Relative %: 71 %
Platelets: 250 10*3/uL (ref 150–400)
RBC: 4.59 MIL/uL (ref 4.22–5.81)
RDW: 12.4 % (ref 11.5–15.5)
WBC: 8.5 10*3/uL (ref 4.0–10.5)
nRBC: 0 % (ref 0.0–0.2)

## 2021-02-08 LAB — BASIC METABOLIC PANEL
Anion gap: 9 (ref 5–15)
BUN: 11 mg/dL (ref 6–20)
CO2: 26 mmol/L (ref 22–32)
Calcium: 9.1 mg/dL (ref 8.9–10.3)
Chloride: 100 mmol/L (ref 98–111)
Creatinine, Ser: 0.96 mg/dL (ref 0.61–1.24)
GFR, Estimated: >60 mL/min (ref >60)
Glucose, Bld: 115 mg/dL — ABNORMAL HIGH (ref 70–99)
Potassium: 4.2 mmol/L (ref 3.5–5.1)
Sodium: 135 mmol/L (ref 135–145)

## 2021-02-08 LAB — URIC ACID: Uric Acid, Serum: 7.5 mg/dL (ref 3.7–8.6)

## 2021-02-08 LAB — TROPONIN I (HIGH SENSITIVITY): Troponin I (High Sensitivity): 5 ng/L (ref <18)

## 2021-02-08 MED ORDER — HYDROCODONE-ACETAMINOPHEN 5-325 MG PO TABS: 1.0000 | ORAL_TABLET | ORAL | 0 refills | Status: DC | PRN

## 2021-02-08 MED ORDER — ACETAMINOPHEN 325 MG PO TABS
650.0000 mg | ORAL_TABLET | Freq: Once | ORAL | Status: AC
  Administered 2021-02-08: 650 mg via ORAL
  Filled 2021-02-08: qty 2

## 2021-02-08 NOTE — ED Triage Notes (Signed)
Pt comes into the ED via POV c/o wrist pain, lack of mobility in the wrists, and numbness to both hands.  PT states that he started a new job and now he is doing a lot of repetitive movements with lifting 10lb items at work.  Pt in NAd at this time and states these symptoms have been ongoing for a couple days.  PT has good mobility at this time.

## 2021-02-08 NOTE — ED Notes (Signed)
See triage note  presents with pain to both wrist with some swelling   also states he has some discomfort to left shoulder also   denies any injury  min swelling noted to left wrist  good pulses  pain started couple of days ago

## 2021-02-08 NOTE — ED Provider Notes (Signed)
Emergency Medicine Provider Triage Evaluation Note  Jeremiah Alexander , a 50 y.o. male  was evaluated in triage.  Pt complains of bilateral wrist pain and numbness bilateral hands. He started a new job. No alleviating measures prior to arrival.  Review of Systems  Positive: Bilateral hand/wrist pain Negative: Weakness  Physical Exam  BP (!) 177/107 (BP Location: Left Arm) Comment: taken 2 times for double check  Pulse (!) 106   Temp 99.8 F (37.7 C) (Oral)   Resp 18   Ht 6' (1.829 m)   Wt 127 kg   SpO2 98%   BMI 37.97 kg/m  Gen:   Awake, no distress   Resp:  Normal effort  MSK:   Moves extremities without difficulty  Other:    Medical Decision Making  Medically screening exam initiated at 8:36 AM.  Appropriate orders placed.  [name] was informed that the remainder of the evaluation will be completed by another provider, this initial triage assessment does not replace that evaluation, and the importance of remaining in the ED until their evaluation is complete.    [name], FNP 02/09/21 1252    [name], MD 02/09/21 [phone]

## 2021-02-08 NOTE — ED Provider Notes (Signed)
[facility] Emergency Department Provider Note    Event Date/Time   First MD Initiated Contact with Patient 02/08/21 [phone]     (approximate)  I have reviewed the triage vital signs and the nursing notes.   HISTORY  Chief Complaint Wrist Pain and Numbness    HPI Jeremiah Alexander is a 50 y.o. male below listed past medical history presents to the ER for evaluation of pain in his left hand and shoulder as well as tingling in his right hand.  States he does work with his hands doing repetitive motion.  States that he feels like he is having to shake his hands out.  Primarily affecting the thumb index and middle finger.  Denies any diagnosis of carpal tunnel no history of gout.  Has not had any fevers or chills.  No specific injury reported.  Past Medical History:  Diagnosis Date   Diabetes mellitus without complication (HCC)    Hypertension    Family History  Problem Relation Age of Onset   Diabetes Other    Past Surgical History:  Procedure Laterality Date   TOE AMPUTATION     Right Pinky Toe   Patient Active Problem List   Diagnosis Date Noted   TOBACCO ABUSE 06/05/2010   DIABETES MELLITUS, TYPE II 05/08/2010   OBESITY 05/08/2010   HYPERTENSION, BENIGN ESSENTIAL 05/08/2010      Prior to Admission medications   Medication Sig Start Date End Date Taking? Authorizing Provider  ibuprofen (ADVIL) 600 MG tablet Take 1 tablet (600 mg total) by mouth every 8 (eight) hours as needed. 07/06/19   Joni Reining, PA-C  insulin aspart protamine-insulin aspart (NOVOLOG 70/30) (70-30) 100 UNIT/ML injection Inject 45 Units into the skin 2 (two) times daily with a meal. 07/21/12   Richarda Overlie, MD  lisinopril (PRINIVIL,ZESTRIL) 10 MG tablet TAKE ONE TABLET BY MOUTH ONCE DAILY Patient taking differently: 20 mg.  12/02/12   Cleora Fleet, MD  metFORMIN (GLUCOPHAGE) 500 MG tablet Take 1 tablet (500 mg total) by mouth 2 (two) times daily with a meal. 07/21/12    Richarda Overlie, MD    Allergies Patient has no known allergies.    Social History Social History   Tobacco Use   Smoking status: Some Days    Packs/day: 0.50    Types: Cigarettes   Smokeless tobacco: Never  Substance Use Topics   Alcohol use: No    Review of Systems Patient denies headaches, rhinorrhea, blurry vision, numbness, shortness of breath, chest pain, edema, cough, abdominal pain, nausea, vomiting, diarrhea, dysuria, fevers, rashes or hallucinations unless otherwise stated above in HPI. ____________________________________________   PHYSICAL EXAM:  VITAL SIGNS: Vitals:   02/08/21 0834 02/08/21 0937  BP: (!) 177/107 (!) 170/101  Pulse:  100  Resp:  18  Temp:    SpO2:  98%    Constitutional: Alert and oriented.  Eyes: Conjunctivae are normal.  Head: Atraumatic. Nose: No congestion/rhinnorhea. Mouth/Throat: Mucous membranes are moist.   Neck: No stridor. Painless ROM.  Cardiovascular: Normal rate, regular rhythm. Grossly normal heart sounds.  Good peripheral circulation. Respiratory: Normal respiratory effort.  No retractions. Lungs CTAB. Gastrointestinal: Soft and nontender. No distention. No abdominal bruits. No CVA tenderness. Genitourinary:  Musculoskeletal: No lower extremity tenderness nor edema.  No joint effusions.  Left hand with some pain mild swelling around the wrist neurovascular intact distally.  No pain along the flexor tendons no fusiform swelling.  No crepitus.  Pain is worsened by flexion  of the hand at the wrist and with percussion of volar wrist.  Similar but not as significant findings on the right hand and wrist. Neurologic:  Normal speech and language. No gross focal neurologic deficits are appreciated. No facial droop Skin:  Skin is warm, dry and intact. No rash noted. Psychiatric: Mood and affect are normal. Speech and behavior are normal.  ____________________________________________   LABS (all labs ordered are listed, but only  abnormal results are displayed)  Results for orders placed or performed during the hospital encounter of 02/08/21 (from the past 24 hour(s))  CBC with Differential     Status: None   Collection Time: 02/08/21 10:38 AM  Result Value Ref Range   WBC 8.5 4.0 - 10.5 K/uL   RBC 4.59 4.22 - 5.81 MIL/uL   Hemoglobin 14.7 13.0 - 17.0 g/dL   HCT 70.9 62.8 - 36.6 %   MCV 90.4 80.0 - 100.0 fL   MCH 32.0 26.0 - 34.0 pg   MCHC 35.4 30.0 - 36.0 g/dL   RDW 29.4 76.5 - 46.5 %   Platelets 250 150 - 400 K/uL   nRBC 0.0 0.0 - 0.2 %   Neutrophils Relative % 71 %   Neutro Abs 6.1 1.7 - 7.7 K/uL   Lymphocytes Relative 17 %   Lymphs Abs 1.5 0.7 - 4.0 K/uL   Monocytes Relative 9 %   Monocytes Absolute 0.7 0.1 - 1.0 K/uL   Eosinophils Relative 2 %   Eosinophils Absolute 0.2 0.0 - 0.5 K/uL   Basophils Relative 1 %   Basophils Absolute 0.0 0.0 - 0.1 K/uL   Immature Granulocytes 0 %   Abs Immature Granulocytes 0.02 0.00 - 0.07 K/uL  Basic metabolic panel     Status: Abnormal   Collection Time: 02/08/21 10:38 AM  Result Value Ref Range   Sodium 135 135 - 145 mmol/L   Potassium 4.2 3.5 - 5.1 mmol/L   Chloride 100 98 - 111 mmol/L   CO2 26 22 - 32 mmol/L   Glucose, Bld 115 (H) 70 - 99 mg/dL   BUN 11 6 - 20 mg/dL   Creatinine, Ser 0.35 0.61 - 1.24 mg/dL   Calcium 9.1 8.9 - 46.5 mg/dL   GFR, Estimated >68 >12 mL/min   Anion gap 9 5 - 15  Uric acid     Status: None   Collection Time: 02/08/21 10:38 AM  Result Value Ref Range   Uric Acid, Serum 7.5 3.7 - 8.6 mg/dL   ____________________________________________  EKG____________________________________________ ED ECG REPORT I, Willy Eddy, the attending physician, personally viewed and interpreted this ECG.   Date: 02/08/2021  EKG Time: 11:59  Rate: 75  Rhythm: sinus  Axis: normal  Intervals: normal  ST&T Change: normal intervals, no stemi   RADIOLOGY  I personally reviewed all radiographic images ordered to evaluate for the above acute  complaints and reviewed radiology reports and findings.  These findings were personally discussed with the patient.  Please see medical record for radiology report.  ____________________________________________   PROCEDURES  Procedure(s) performed:  Procedures    Critical Care performed: no ____________________________________________   INITIAL IMPRESSION / ASSESSMENT AND PLAN / ED COURSE  Pertinent labs & imaging results that were available during my care of the patient were reviewed by me and considered in my medical decision making (see chart for details).   DDX: carpal tunnel, infection, arthritis, gout, radiculopathy,  Jeremiah Alexander is a 50 y.o. who presents to the ED with presentation as  described above.  Patient nontoxic-appearing mildly hypertensive symptoms very reproducible pain bilateral hands per Derm dominantly on the left concerning for carpal tunnel.  Have a lower suspicion for cellulitis.  It is not have any white count not consistent with gout.  Does not seem consistent with septic arthritis given lack of fever or white count.  X-rays are reassuring.  Given repetitive motion and history, presentation suggestive of carpal tunnel.  EKG is nonischemic.  Troponin is negative.  Will provide symptomatic treatment and outpatient referral.     The patient was evaluated in Emergency Department today for the symptoms described in the history of present illness. He/she was evaluated in the context of the global COVID-19 pandemic, which necessitated consideration that the patient might be at risk for infection with the SARS-CoV-2 virus that causes COVID-19. Institutional protocols and algorithms that pertain to the evaluation of patients at risk for COVID-19 are in a state of rapid change based on information released by regulatory bodies including the CDC and federal and state organizations. These policies and algorithms were followed during the patient's care in the ED.  As part  of my medical decision making, I reviewed the following data within the electronic MEDICAL RECORD NUMBER Nursing notes reviewed and incorporated, Labs reviewed, notes from prior ED visits and [place] Controlled Substance Database   ____________________________________________   FINAL CLINICAL IMPRESSION(S) / ED DIAGNOSES  Final diagnoses:  Pain in both wrists      NEW MEDICATIONS STARTED DURING THIS VISIT:  New Prescriptions   No medications on file     Note:  This document was prepared using Dragon voice recognition software and may include unintentional dictation errors.    Willy Eddy, MD 02/08/21 [phone]

## 2022-01-22 IMAGING — CR DG SHOULDER 2+V*L*
1 series · 3 of 3 positions shown · non-contrast
Comparison: None.

CLINICAL DATA: Left shoulder pain.

EXAM:
LEFT SHOULDER - 2+ VIEW

[Series 1: dg shoulder left · 0.14mm/px · 3 of 3 slices shown]
[im 1/3]
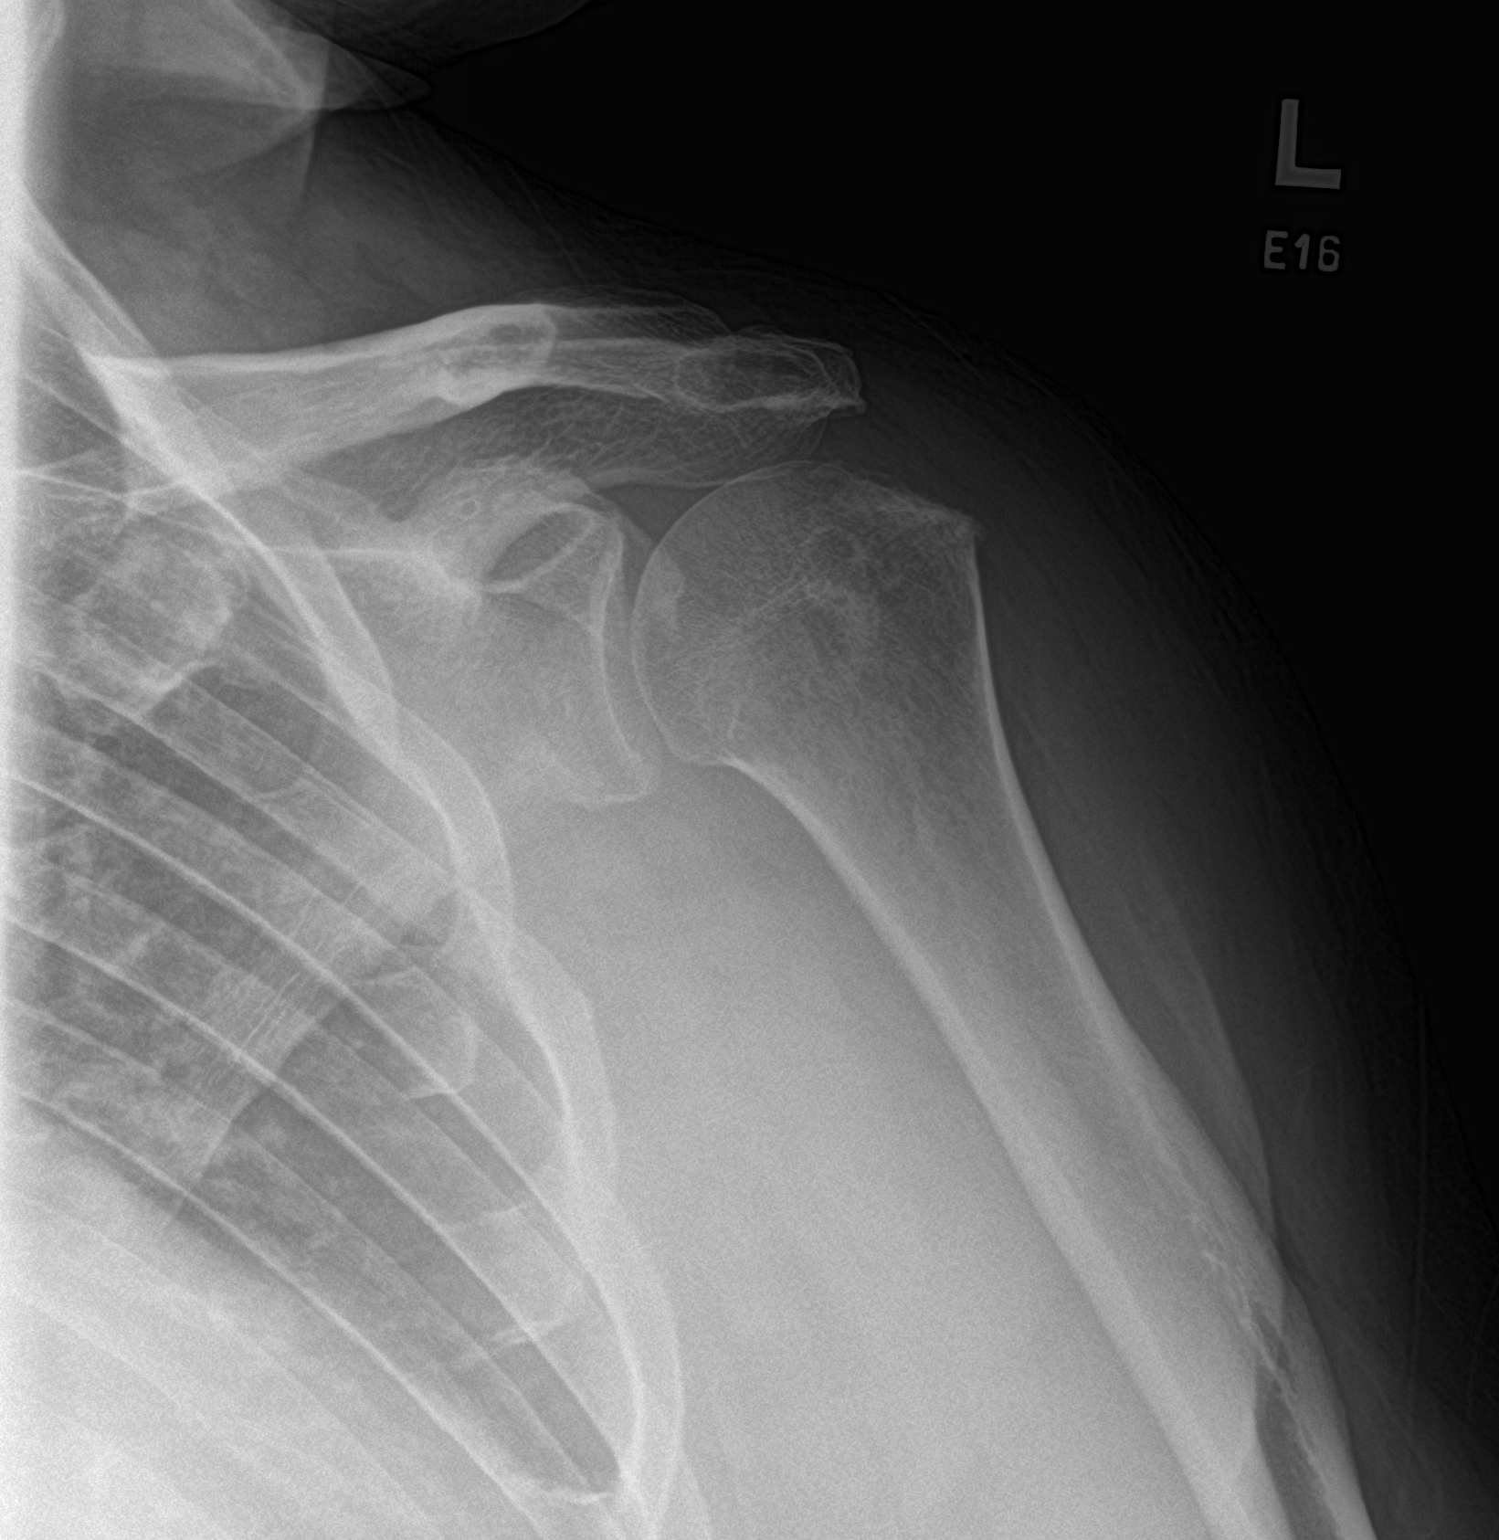
[im 2/3]
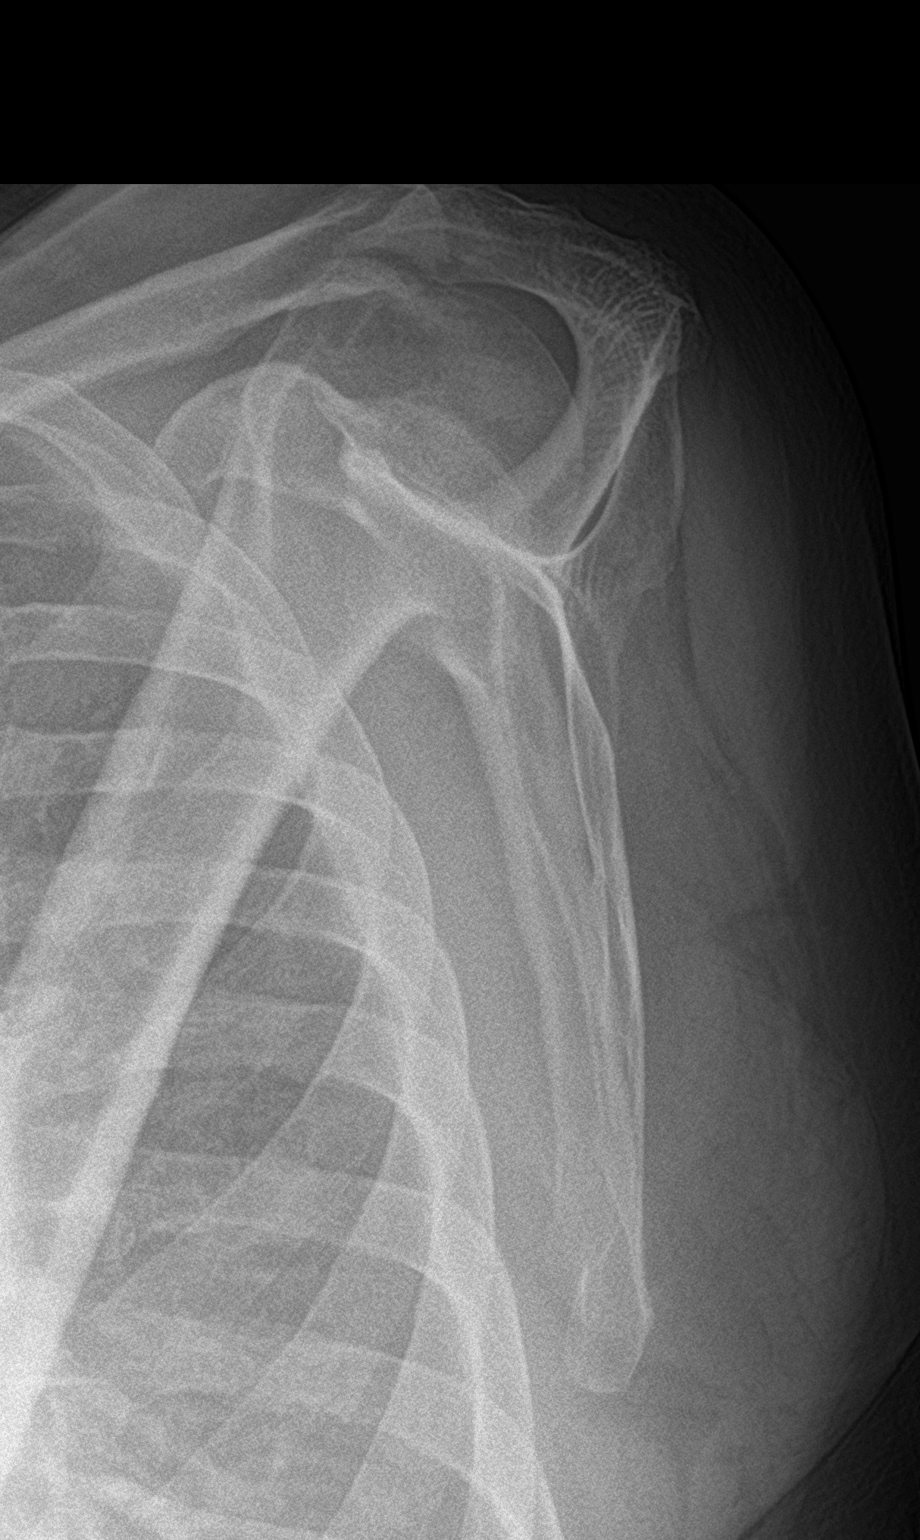
[im 3/3]
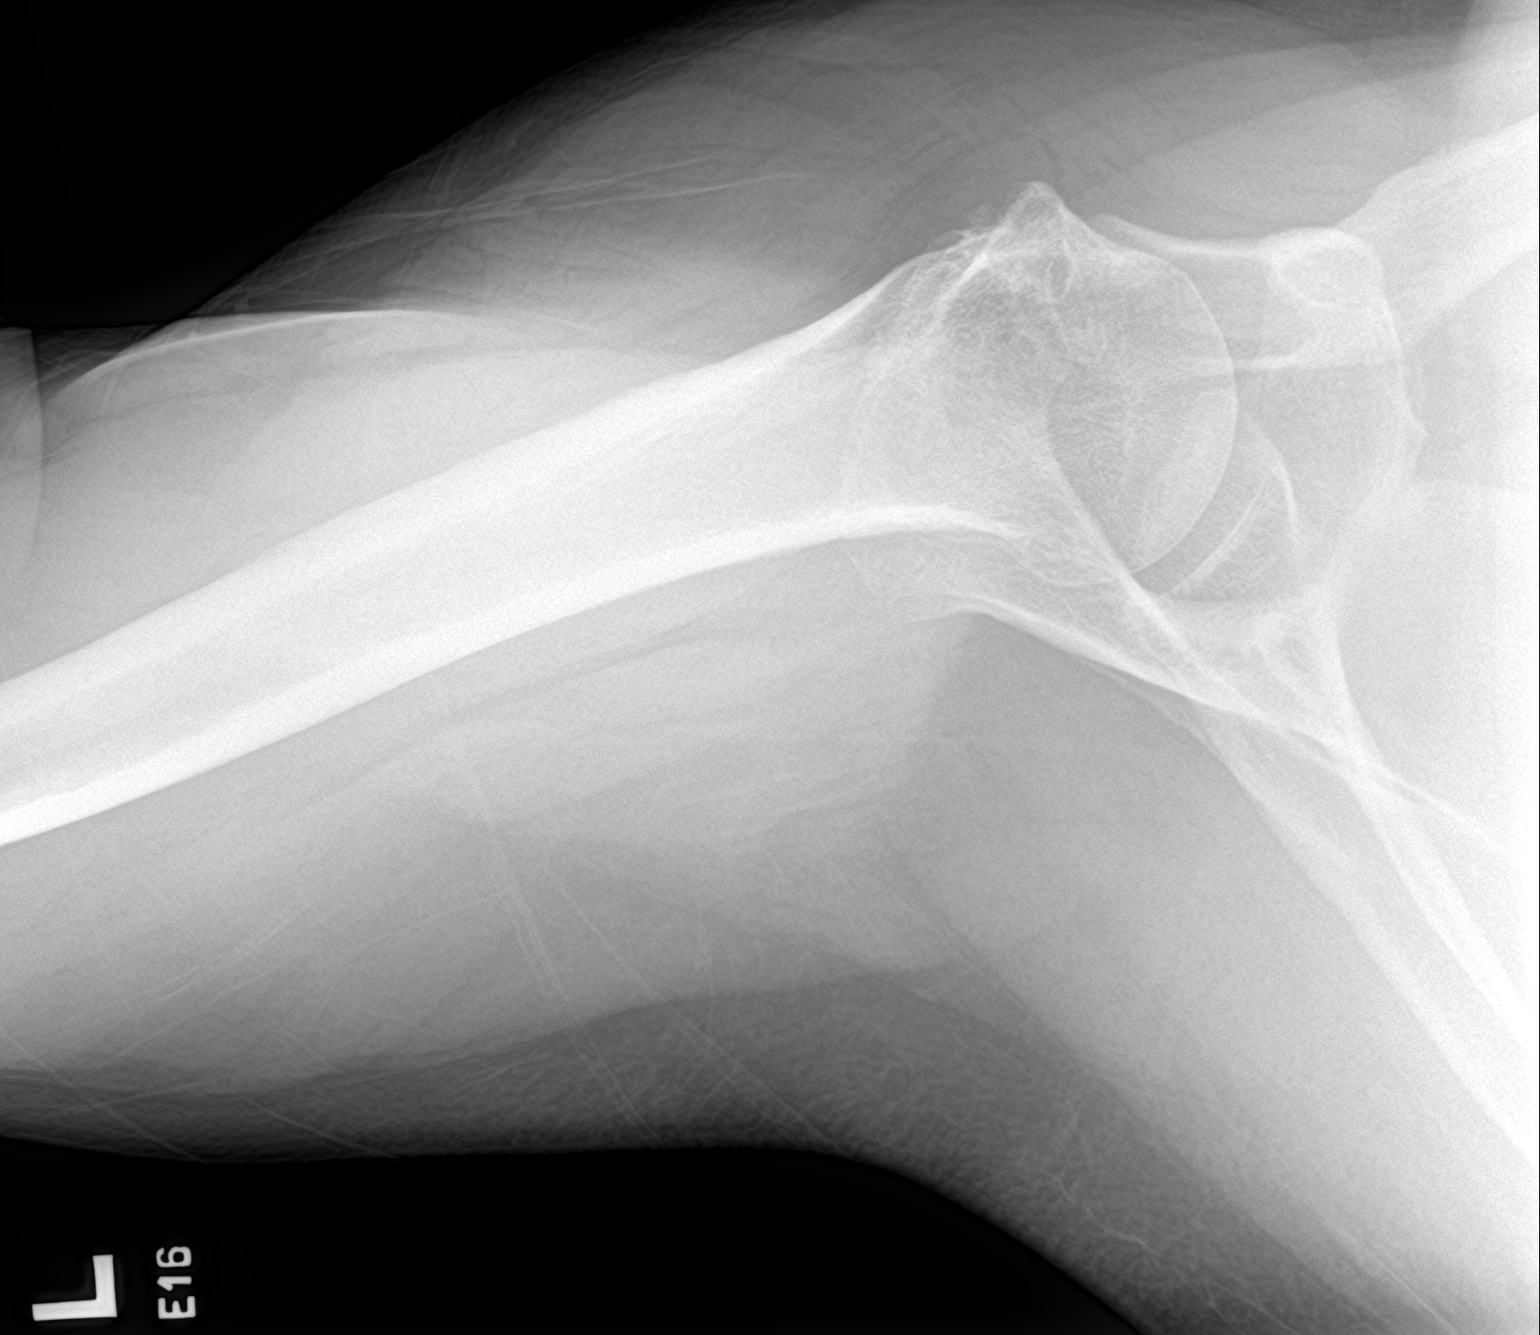

[3 of 3 positions shown; findings below may reference images not displayed]

FINDINGS: There is no evidence of fracture or dislocation. There is no
evidence of arthropathy or other focal bone abnormality. Soft
tissues are unremarkable.
IMPRESSION: Negative.

## 2022-02-21 ENCOUNTER — Inpatient Hospital Stay (HOSPITAL_COMMUNITY)
Admission: EM | Admit: 2022-02-21 | Discharge: 2022-02-23 | DRG: 176 | Disposition: A | Discharge status: Home / Self Care | Payer: Commercial Managed Care - HMO | Attending: Family Medicine | Admitting: Family Medicine

## 2022-02-21 ENCOUNTER — Emergency Department (HOSPITAL_COMMUNITY): Payer: Commercial Managed Care - HMO

## 2022-02-21 ENCOUNTER — Other Ambulatory Visit: Payer: Self-pay

## 2022-02-21 ENCOUNTER — Encounter (HOSPITAL_COMMUNITY): Payer: Self-pay

## 2022-02-21 ENCOUNTER — Emergency Department (EMERGENCY_DEPARTMENT_HOSPITAL): Payer: Commercial Managed Care - HMO

## 2022-02-21 DIAGNOSIS — I2699 Other pulmonary embolism without acute cor pulmonale: Principal | ICD-10-CM | POA: Diagnosis present

## 2022-02-21 DIAGNOSIS — M069 Rheumatoid arthritis, unspecified: Secondary | ICD-10-CM

## 2022-02-21 DIAGNOSIS — R600 Localized edema: Secondary | ICD-10-CM

## 2022-02-21 DIAGNOSIS — M7121 Synovial cyst of popliteal space [Baker], right knee: Secondary | ICD-10-CM | POA: Diagnosis present

## 2022-02-21 DIAGNOSIS — Z760 Encounter for issue of repeat prescription: Secondary | ICD-10-CM

## 2022-02-21 DIAGNOSIS — E119 Type 2 diabetes mellitus without complications: Secondary | ICD-10-CM | POA: Diagnosis present

## 2022-02-21 DIAGNOSIS — I10 Essential (primary) hypertension: Secondary | ICD-10-CM | POA: Diagnosis present

## 2022-02-21 DIAGNOSIS — Z79899 Other long term (current) drug therapy: Secondary | ICD-10-CM

## 2022-02-21 DIAGNOSIS — Z7984 Long term (current) use of oral hypoglycemic drugs: Secondary | ICD-10-CM

## 2022-02-21 DIAGNOSIS — J9811 Atelectasis: Secondary | ICD-10-CM | POA: Diagnosis present

## 2022-02-21 DIAGNOSIS — M79604 Pain in right leg: Secondary | ICD-10-CM

## 2022-02-21 DIAGNOSIS — Z833 Family history of diabetes mellitus: Secondary | ICD-10-CM

## 2022-02-21 DIAGNOSIS — R0602 Shortness of breath: Secondary | ICD-10-CM | POA: Diagnosis not present

## 2022-02-21 DIAGNOSIS — F17210 Nicotine dependence, cigarettes, uncomplicated: Secondary | ICD-10-CM | POA: Diagnosis present

## 2022-02-21 DIAGNOSIS — Z794 Long term (current) use of insulin: Secondary | ICD-10-CM

## 2022-02-21 LAB — CBC WITH DIFFERENTIAL/PLATELET
Abs Immature Granulocytes: 0.06 10*3/uL (ref 0.00–0.07)
Basophils Absolute: 0 10*3/uL (ref 0.0–0.1)
Basophils Relative: 0 %
Eosinophils Absolute: 0.2 10*3/uL (ref 0.0–0.5)
Eosinophils Relative: 2 %
HCT: 42.6 % (ref 39.0–52.0)
Hemoglobin: 14.1 g/dL (ref 13.0–17.0)
Immature Granulocytes: 1 %
Lymphocytes Relative: 17 %
Lymphs Abs: 1.4 10*3/uL (ref 0.7–4.0)
MCH: 29.5 pg (ref 26.0–34.0)
MCHC: 33.1 g/dL (ref 30.0–36.0)
MCV: 89.1 fL (ref 80.0–100.0)
Monocytes Absolute: 0.8 10*3/uL (ref 0.1–1.0)
Monocytes Relative: 9 %
Neutro Abs: 6.2 10*3/uL (ref 1.7–7.7)
Neutrophils Relative %: 71 %
Platelets: 362 10*3/uL (ref 150–400)
RBC: 4.78 MIL/uL (ref 4.22–5.81)
RDW: 13.6 % (ref 11.5–15.5)
WBC: 8.6 10*3/uL (ref 4.0–10.5)
nRBC: 0 % (ref 0.0–0.2)

## 2022-02-21 LAB — BASIC METABOLIC PANEL
Anion gap: 10 (ref 5–15)
BUN: 5 mg/dL — ABNORMAL LOW (ref 6–20)
CO2: 26 mmol/L (ref 22–32)
Calcium: 9 mg/dL (ref 8.9–10.3)
Chloride: 96 mmol/L — ABNORMAL LOW (ref 98–111)
Creatinine, Ser: 0.82 mg/dL (ref 0.61–1.24)
GFR, Estimated: >60 mL/min (ref >60)
Glucose, Bld: 365 mg/dL — ABNORMAL HIGH (ref 70–99)
Potassium: 4.1 mmol/L (ref 3.5–5.1)
Sodium: 132 mmol/L — ABNORMAL LOW (ref 135–145)

## 2022-02-21 LAB — TROPONIN I (HIGH SENSITIVITY): Troponin I (High Sensitivity): 5 ng/L (ref <18)

## 2022-02-21 LAB — PROTIME-INR
INR: 0.9 (ref 0.8–1.2)
Prothrombin Time: 12.5 seconds (ref 11.4–15.2)

## 2022-02-21 LAB — D-DIMER, QUANTITATIVE: D-Dimer, Quant: 6.58 ug/mL-FEU — ABNORMAL HIGH (ref 0.00–0.50)

## 2022-02-21 LAB — BRAIN NATRIURETIC PEPTIDE: B Natriuretic Peptide: 11.8 pg/mL (ref 0.0–100.0)

## 2022-02-21 MED ORDER — LISINOPRIL 20 MG PO TABS: 20.0000 mg | ORAL_TABLET | Freq: Every day | ORAL | 1 refills | Status: DC

## 2022-02-21 MED ORDER — INSULIN ASPART PROT & ASPART (70-30 MIX) 100 UNIT/ML ~~LOC~~ SUSP: 45.0000 [IU] | Freq: Two times a day (BID) | SUBCUTANEOUS | 6 refills | Status: DC

## 2022-02-21 MED ORDER — HEPARIN BOLUS VIA INFUSION
6500.0000 [IU] | Freq: Once | INTRAVENOUS | Status: AC
  Administered 2022-02-22: 6500 [IU] via INTRAVENOUS
  Filled 2022-02-21: qty 6500

## 2022-02-21 MED ORDER — HEPARIN (PORCINE) 25000 UT/250ML-% IV SOLN
2300.0000 [IU]/h | INTRAVENOUS | Status: AC
  Administered 2022-02-22: 1900 [IU]/h via INTRAVENOUS
  Administered 2022-02-22: 2300 [IU]/h via INTRAVENOUS
  Filled 2022-02-21 (×4): qty 250

## 2022-02-21 MED ORDER — METFORMIN HCL 500 MG PO TABS: 500.0000 mg | ORAL_TABLET | Freq: Two times a day (BID) | ORAL | 6 refills | Status: DC

## 2022-02-21 MED ORDER — IOHEXOL 350 MG/ML SOLN
80.0000 mL | Freq: Once | INTRAVENOUS | Status: AC | PRN
  Administered 2022-02-21: 80 mL via INTRAVENOUS

## 2022-02-21 NOTE — Progress Notes (Signed)
ANTICOAGULATION CONSULT NOTE - Initial Consult  Pharmacy Consult for Heparin Indication: pulmonary embolus  No Known Allergies  Patient Measurements:   Heparin Dosing Weight: 108.2 kg  Vital Signs: Temp: 99.1 F (37.3 C) (10/04 1924) Temp Source: Oral (10/04 1924) BP: 144/100 (10/04 1957) Pulse Rate: 90 (10/04 1957)  Labs: Recent Labs    02/21/22 1825 02/21/22 1950  HGB 14.1  --   HCT 42.6  --   PLT 362  --   CREATININE  --  0.82  TROPONINIHS  --  5    CrCl cannot be calculated (Unknown ideal weight.).   Medical History: Past Medical History:  Diagnosis Date   Diabetes mellitus without complication ([place])    Hypertension     Medications:  (Not in a hospital admission)  Scheduled:  Infusions:  PRN:   Assessment: 5 yom with a history of DM, HTN, RA. Patient is presenting with Leg swelling/ chest pain . Heparin per pharmacy consult placed for pulmonary embolus.  CTA PE w/ acute bilateral lobar and segmental PE w/ evidence of RHS  Patient is not on anticoagulation prior to arrival.  Hgb 14.1; plt 362  Goal of Therapy:  Heparin level 0.3-0.7 units/ml Monitor platelets by anticoagulation protocol: Yes   Plan:  Give IV heparin 6500 units bolus x 1 Start heparin infusion at 1900 units/hr Check anti-Xa level at 0600 and daily while on heparin Continue to monitor H&H and platelets  Lorelei Pont, PharmD, BCPS 02/21/2022 11:26 PM ED Clinical Pharmacist -  [phone]

## 2022-02-21 NOTE — Care Management (Signed)
TOC has reviewed patient's record and will continue to follow up for transitional care needs.    Laurena Slimmer RN, BSN  ED Care Manager [phone]

## 2022-02-21 NOTE — Progress Notes (Signed)
Right lower extremity venous duplex completed. Refer to "CV Proc" under chart review to view preliminary results.  02/21/2022 6:09 PM Kelby Aline., MHA, RVT, RDCS, RDMS

## 2022-02-21 NOTE — ED Provider Notes (Signed)
[facility]GENCY DEPARTMENT Provider Note  CSN: [id number] Arrival date & time: 02/21/22 1006  Chief Complaint(s) No chief complaint on file.  HPI Jeremiah Alexander is a 51 y.o. male with history of diabetes, hypertension presenting to the emergency department asking for medication refills.  Patient also reports bilateral knee pain.  He reports although he is not taking his metformin or his lisinopril he is taking over-the-counter insulin.  He reports that recently he has had some increased right leg swelling which is new.  He also reports some right-sided back and flank pain which is worse with deep breathing.  No chest pain.  He reports no shortness of breath, no fainting or syncope.  No recent travel or surgeries.  Denies similar episodes in the past.   Past Medical History Past Medical History:  Diagnosis Date   Diabetes mellitus without complication ([place])    Hypertension    Patient Active Problem List   Diagnosis Date Noted   TOBACCO ABUSE 06/05/2010   DIABETES MELLITUS, TYPE II 05/08/2010   OBESITY 05/08/2010   HYPERTENSION, BENIGN ESSENTIAL 05/08/2010   Home Medication(s) Prior to Admission medications   Medication Sig Start Date End Date Taking? Authorizing Provider  HYDROcodone-acetaminophen (NORCO) 5-325 MG tablet Take 1 tablet by mouth every 4 (four) hours as needed for moderate pain. 02/08/21   Merlyn Lot, MD  ibuprofen (ADVIL) 600 MG tablet Take 1 tablet (600 mg total) by mouth every 8 (eight) hours as needed. 07/06/19   Sable Feil, PA-C  insulin aspart protamine- aspart (NOVOLOG MIX 70/30) (70-30) 100 UNIT/ML injection Inject 0.45 mLs (45 Units total) into the skin 2 (two) times daily with a meal. 02/21/22   Cristie Hem, MD  lisinopril (ZESTRIL) 20 MG tablet Take 1 tablet (20 mg total) by mouth daily. 02/21/22   Cristie Hem, MD  metFORMIN (GLUCOPHAGE) 500 MG tablet Take 1 tablet (500 mg total) by mouth 2 (two) times daily with  a meal. 02/21/22   Cristie Hem, MD                                                                                                                                    Past Surgical History Past Surgical History:  Procedure Laterality Date   TOE AMPUTATION     Right Pinky Toe   Family History Family History  Problem Relation Age of Onset   Diabetes Other     Social History Social History   Tobacco Use   Smoking status: Some Days    Packs/day: 0.50    Types: Cigarettes   Smokeless tobacco: Never  Substance Use Topics   Alcohol use: No   Allergies Patient has no known allergies.  Review of Systems Review of Systems  All other systems reviewed and are negative.   Physical Exam Vital Signs  I have reviewed the triage vital signs BP (!) 144/100 (BP Location:  Right Arm)   Pulse 90   Temp 99.1 F (37.3 C) (Oral)   Resp 18   SpO2 97%  Physical Exam Vitals and nursing note reviewed.  Constitutional:      General: He is not in acute distress.    Appearance: Normal appearance.  HENT:     Mouth/Throat:     Mouth: Mucous membranes are moist.  Eyes:     Conjunctiva/sclera: Conjunctivae normal.  Cardiovascular:     Rate and Rhythm: Normal rate and regular rhythm.  Pulmonary:     Effort: Pulmonary effort is normal. No respiratory distress.     Breath sounds: Normal breath sounds.  Abdominal:     General: Abdomen is flat.     Palpations: Abdomen is soft.     Tenderness: There is no abdominal tenderness.  Musculoskeletal:     Right lower leg: Edema present.     Left lower leg: Edema present.     Comments: Bilateral lower extremity edema, right greater than left  Skin:    General: Skin is warm and dry.     Capillary Refill: Capillary refill takes less than 2 seconds.  Neurological:     Mental Status: He is alert and oriented to person, place, and time. Mental status is at baseline.  Psychiatric:        Mood and Affect: Mood normal.        Behavior: Behavior  normal.     ED Results and Treatments Labs (all labs ordered are listed, but only abnormal results are displayed) Labs Reviewed  BASIC METABOLIC PANEL - Abnormal; Notable for the following components:      Result Value   Sodium 132 (*)    Chloride 96 (*)    Glucose, Bld 365 (*)    BUN 5 (*)    All other components within normal limits  D-DIMER, QUANTITATIVE (NOT AT [facility]) - Abnormal; Notable for the following components:   D-Dimer, Quant 6.58 (*)    All other components within normal limits  CBC WITH DIFFERENTIAL/PLATELET  BRAIN NATRIURETIC PEPTIDE  TROPONIN I (HIGH SENSITIVITY)                                                                                                                          Radiology CT Angio Chest PE W and/or Wo Contrast  Result Date: 02/21/2022 CLINICAL DATA:  Chest pain for several weeks, lower extremity swelling, flank pain EXAM: CT ANGIOGRAPHY CHEST WITH CONTRAST TECHNIQUE: Multidetector CT imaging of the chest was performed using the standard protocol during bolus administration of intravenous contrast. Multiplanar CT image reconstructions and MIPs were obtained to evaluate the vascular anatomy. RADIATION DOSE REDUCTION: This exam was performed according to the departmental dose-optimization program which includes automated exposure control, adjustment of the mA and/or kV according to patient size and/or use of iterative reconstruction technique. CONTRAST:  80mL OMNIPAQUE IOHEXOL 350 MG/ML SOLN COMPARISON:  02/21/2022 FINDINGS: Cardiovascular: This is a technically adequate evaluation of the pulmonary  vasculature. There are bilateral lobar and segmental pulmonary emboli, greatest within the lower lobes. Moderate clot burden, with dilated RV/LV ratio measuring 1.29 consistent with right heart strain. No pericardial effusion. Atherosclerosis of the coronary vasculature. Normal caliber of the thoracic aorta, with mild atherosclerosis of the aortic arch.  Mediastinum/Nodes: No enlarged mediastinal, hilar, or axillary lymph nodes. Thyroid gland, trachea, and esophagus demonstrate no significant findings. Lungs/Pleura: Mild emphysema at the lung apices. Linear areas of consolidation within the dependent lower lobes, favor hypoventilatory changes over pulmonary infarct. Trace right pleural effusion. No pneumothorax. Central airways are patent. Upper Abdomen: No acute abnormality. Musculoskeletal: No acute or destructive bony lesions. Reconstructed images demonstrate no additional findings. Review of the MIP images confirms the above findings. IMPRESSION: 1. Positive for acute bilateral lobar and segmental PE with CT evidence of right heart strain (RV/LV Ratio = 1.29) consistent with at least submassive (intermediate risk) PE. The presence of right heart strain has been associated with an increased risk of morbidity and mortality. Please refer to the "Code PE Focused" order set in EPIC. 2. Bibasilar areas of linear consolidation, favor atelectasis over pulmonary infarct. 3. Aortic Atherosclerosis (ICD10-I70.0) and Emphysema (ICD10-J43.9). Critical Value/emergent results were called by telephone at the time of interpretation on 02/21/2022 at 11:21 pm to provider Alvino Blood , who verbally acknowledged these results. Electronically Signed   By: Sharlet Salina M.D.   On: 02/21/2022 23:23   VAS [place] LOWER EXTREMITY VENOUS (DVT) (7a-7p)  Result Date: 02/21/2022  Lower Venous DVT Study Patient Name:  Jeremiah Alexander  Date of Exam:   02/21/2022 Medical Rec #: [id number]       Accession #:    [id number] Date of Birth: 11-14-1970       Patient Gender: M Patient Age:   87 years Exam Location:  [facility] Procedure:      VAS [place] LOWER EXTREMITY VENOUS (DVT) Referring Phys: Alvino Blood --------------------------------------------------------------------------------  Indications: Edema, Pain, and rheumatoid arthritis.  Comparison Study: No prior study Performing  Technologist: Gertie Fey MHA, RDMS, RVT, RDCS  Examination Guidelines: A complete evaluation includes B-mode imaging, spectral Doppler, color Doppler, and power Doppler as needed of all accessible portions of each vessel. Bilateral testing is considered an integral part of a complete examination. Limited examinations for reoccurring indications may be performed as noted. The reflux portion of the exam is performed with the patient in reverse Trendelenburg.  +---------+---------------+---------+-----------+----------+--------------+ RIGHT    CompressibilityPhasicitySpontaneityPropertiesThrombus Aging +---------+---------------+---------+-----------+----------+--------------+ CFV      Full           Yes      Yes                                 +---------+---------------+---------+-----------+----------+--------------+ SFJ      Full                                                        +---------+---------------+---------+-----------+----------+--------------+ FV Prox  Full                                                        +---------+---------------+---------+-----------+----------+--------------+  FV Mid   Full                                                        +---------+---------------+---------+-----------+----------+--------------+ FV DistalFull                                                        +---------+---------------+---------+-----------+----------+--------------+ PFV      Full                                                        +---------+---------------+---------+-----------+----------+--------------+ POP      Full           Yes      Yes                                 +---------+---------------+---------+-----------+----------+--------------+ PTV      Full                                                        +---------+---------------+---------+-----------+----------+--------------+ PERO     Full                                                         +---------+---------------+---------+-----------+----------+--------------+   +----+---------------+---------+-----------+----------+--------------+ LEFTCompressibilityPhasicitySpontaneityPropertiesThrombus Aging +----+---------------+---------+-----------+----------+--------------+ CFV Full           Yes      Yes                                 +----+---------------+---------+-----------+----------+--------------+     Summary: RIGHT: - There is no evidence of deep vein thrombosis in the lower extremity.  - A complex cystic structure is found in the popliteal fossa.  LEFT: - No evidence of common femoral vein obstruction.  *See table(s) above for measurements and observations. Electronically signed by Coral Else MD on 02/21/2022 at 10:36:32 PM.    Final    DG Chest 1 View  Result Date: 02/21/2022 CLINICAL DATA:  Chest pain EXAM: CHEST  1 VIEW COMPARISON:  Chest x-ray 10/12/2019 FINDINGS: There is bibasilar atelectasis in the lung bases. There is blunting of the right costophrenic angle. Cardiomediastinal silhouette is within normal limits. No evidence for pneumothorax or acute fracture. IMPRESSION: 1. Bibasilar atelectasis. 2. Blunting of the right costophrenic angle may be due to pleural thickening or small effusion. Electronically Signed   By: Darliss Cheney M.D.   On: 02/21/2022 18:05    Pertinent labs & imaging results that were available during my care of the patient were reviewed by me and considered  in my medical decision making (see MDM for details).  Medications Ordered in ED Medications  iohexol (OMNIPAQUE) 350 MG/ML injection 80 mL (80 mLs Intravenous Contrast Given 02/21/22 2254)                                                                                                                                     Procedures .Critical Care  Performed by: Lonell Grandchild, MD Authorized by: Lonell Grandchild, MD   Critical care  provider statement:    Critical care time (minutes):  30   Critical care was necessary to treat or prevent imminent or life-threatening deterioration of the following conditions:  Cardiac failure, shock and respiratory failure   Critical care was time spent personally by me on the following activities:  Development of treatment plan with patient or surrogate, discussions with consultants, evaluation of patient's response to treatment, examination of patient, ordering and review of laboratory studies, ordering and review of radiographic studies, ordering and performing treatments and interventions, pulse oximetry, re-evaluation of patient's condition and review of old charts   Care discussed with: admitting provider     (including critical care time)  Medical Decision Making / ED Course   MDM:  51 year old with history of diabetes and hypertension presenting to the emergency department with request for medication refill.  Patient overall well-appearing, physical exam notable for right lower extremity swelling.  ECG reassuring.  We will refill medications.  Labs reassuring, obtain D-dimer given pleuritic pain, is elevated so will obtain CT angiography of the chest given pleuritic pain to rule out PE.  Patient also with right greater than left lower extremity swelling.  BNP negative.  Ultrasound demonstrates possible right-sided Baker's cyst with no sign of DVT.  Doubt ACS because of chest pain, EKG reassuring, troponin negative.  If CT angiography is negative, likely discharge with refills of medications.  Clinical Course as of 02/21/22 2327  Wed Feb 21, 2022  2324 CT angiography demonstrates bilateral pulmonary embolism with evidence of right heart strain.  Patient hemodynamically stable.  Given right heart strain, patient will need admission for further inpatient work-up including echocardiogram.  Troponin negative.  Will page the hospitalist for admission. [WS]    Clinical Course User  Index [WS] Lonell Grandchild, MD     Additional history obtained: -External records from outside source obtained and reviewed including: Chart review including previous notes, labs, imaging, consultation notes   Lab Tests: -I ordered, reviewed, and interpreted labs.   The pertinent results include:   Labs Reviewed  BASIC METABOLIC PANEL - Abnormal; Notable for the following components:      Result Value   Sodium 132 (*)    Chloride 96 (*)    Glucose, Bld 365 (*)    BUN 5 (*)    All other components within normal limits  D-DIMER, QUANTITATIVE (NOT AT [facility]) - Abnormal; Notable for the following components:  D-Dimer, Quant 6.58 (*)    All other components within normal limits  CBC WITH DIFFERENTIAL/PLATELET  BRAIN NATRIURETIC PEPTIDE  TROPONIN I (HIGH SENSITIVITY)      EKG   EKG Interpretation  Date/Time:  Wednesday February 21 2022 18:25:52 EDT Ventricular Rate:  93 PR Interval:  178 QRS Duration: 74 QT Interval:  370 QTC Calculation: 460 R Axis:   8 Text Interpretation: Normal sinus rhythm Normal ECG When compared with ECG of 08-Feb-2021 11:59, PREVIOUS ECG IS PRESENT Confirmed by Alvino Blood ([id number]) on 02/21/2022 6:33:40 PM        Imaging Studies ordered: I ordered imaging studies including CTA chest On my interpretation imaging demonstrates bilateral acute PE I independently visualized and interpreted imaging. I agree with the radiologist interpretation and discussed results with the radiologist   Medicines ordered and prescription drug management: Meds ordered this encounter  Medications   lisinopril (ZESTRIL) 20 MG tablet    Sig: Take 1 tablet (20 mg total) by mouth daily.    Dispense:  30 tablet    Refill:  1   metFORMIN (GLUCOPHAGE) 500 MG tablet    Sig: Take 1 tablet (500 mg total) by mouth 2 (two) times daily with a meal.    Dispense:  60 tablet    Refill:  6   insulin aspart protamine- aspart (NOVOLOG MIX 70/30) (70-30) 100 UNIT/ML  injection    Sig: Inject 0.45 mLs (45 Units total) into the skin 2 (two) times daily with a meal.    Dispense:  10 mL    Refill:  6   iohexol (OMNIPAQUE) 350 MG/ML injection 80 mL    -I have reviewed the patients home medicines and have made adjustments as needed   Consultations Obtained: I requested consultation with the hospitalist,  and discussed lab and imaging findings as well as pertinent plan - they recommend: admit   Cardiac Monitoring: The patient was maintained on a cardiac monitor.  I personally viewed and interpreted the cardiac monitored which showed an underlying rhythm of: NSR  Social Determinants of Health:  Factors impacting patients care include: current smoker   Reevaluation: After the interventions noted above, I reevaluated the patient and found that they have improved  Co morbidities that complicate the patient evaluation  Past Medical History:  Diagnosis Date   Diabetes mellitus without complication (HCC)    Hypertension       Dispostion: Admit    Final Clinical Impression(s) / ED Diagnoses Final diagnoses:  Encounter for medication refill  Synovial cyst of right popliteal space  Other acute pulmonary embolism, unspecified whether acute cor pulmonale present (HCC)     This chart was dictated using voice recognition software.  Despite best efforts to proofread,  errors can occur which can change the documentation meaning.    Lonell Grandchild, MD 02/21/22 [phone]

## 2022-02-21 NOTE — ED Provider Notes (Signed)
  Provider Note MRN:  [id number]  Arrival date & time: 02/21/22    ED Course and Medical Decision Making  Assumed care from [place] at shift change.  See note from prior team for complete details, in brief:  51 yo male  To ED for med refill Leg swelling/ chest pain also reported incidentally Dimer elevated Found to have baker cyst on LE duplex, no DVT CTA pending Trop/bnp wnl No hx dvt/pe On RA, HDS  Plan per prior physician f/u CTA  CTA resulted, shows b/l PE w/ RHS, submassive Start heparin He is HDS, on ambient air, HR stable. Trop not elevated Admit to hospitalist service   .Critical Care  Performed by: Jeanell Sparrow, DO Authorized by: Jeanell Sparrow, DO   Critical care provider statement:    Critical care time (minutes):  30   Critical care time was exclusive of:  Separately billable procedures and treating other patients   Critical care was necessary to treat or prevent imminent or life-threatening deterioration of the following conditions: pe.   Critical care was time spent personally by me on the following activities:  Development of treatment plan with patient or surrogate, discussions with consultants, evaluation of patient's response to treatment, examination of patient, ordering and review of laboratory studies, ordering and review of radiographic studies, ordering and performing treatments and interventions, pulse oximetry, re-evaluation of patient's condition, review of old charts and obtaining history from patient or surrogate   Final Clinical Impressions(s) / ED Diagnoses     ICD-10-CM   1. Other acute pulmonary embolism, unspecified whether acute cor pulmonale present (HCC)  I26.99     2. Encounter for medication refill  Z76.0     3. Synovial cyst of right popliteal space  M71.21       ED Discharge Orders          Ordered    lisinopril (ZESTRIL) 20 MG tablet  Daily        02/21/22 2250    metFORMIN (GLUCOPHAGE) 500 MG tablet  2 times daily with  meals        02/21/22 2250    insulin aspart protamine- aspart (NOVOLOG MIX 70/30) (70-30) 100 UNIT/ML injection  2 times daily with meals        02/21/22 2250            Discharge Instructions   None        Jeanell Sparrow, DO 02/22/22 0001

## 2022-02-21 NOTE — ED Triage Notes (Signed)
Patient complains of bilateral knee down with pain and swelling for several weeks. Patient is a diabetic and hasnt taken meds for months due to insurance issue. Patient with right flank pain for a few days with inspiration

## 2022-02-21 NOTE — Discharge Instructions (Addendum)
Information on my medicine - ELIQUIS (apixaban)  This medication education was reviewed with me or my healthcare representative as part of my discharge preparation.  Why was Eliquis prescribed for you? Eliquis was prescribed to treat blood clots that may have been found in the veins of your legs (deep vein thrombosis) or in your lungs (pulmonary embolism) and to reduce the risk of them occurring again.  What do You need to know about Eliquis ? The starting dose is 10 mg (two 5 mg tablets) taken TWICE daily for the FIRST SEVEN (7) DAYS, then on 03/02/2022  the dose is reduced to ONE 5 mg tablet taken TWICE daily.  Eliquis may be taken with or without food.   Try to take the dose about the same time in the morning and in the evening. If you have difficulty swallowing the tablet whole please discuss with your pharmacist how to take the medication safely.  Take Eliquis exactly as prescribed and DO NOT stop taking Eliquis without talking to the doctor who prescribed the medication.  Stopping may increase your risk of developing a new blood clot.  Refill your prescription before you run out.  After discharge, you should have regular check-up appointments with your healthcare provider that is prescribing your Eliquis.    What do you do if you miss a dose? If a dose of ELIQUIS is not taken at the scheduled time, take it as soon as possible on the same day and twice-daily administration should be resumed. The dose should not be doubled to make up for a missed dose.  Important Safety Information A possible side effect of Eliquis is bleeding. You should call your healthcare provider right away if you experience any of the following: Bleeding from an injury or your nose that does not stop. Unusual colored urine (red or dark brown) or unusual colored stools (red or black). Unusual bruising for unknown reasons. A serious fall or if you hit your head (even if there is no bleeding).  Some  medicines may interact with Eliquis and might increase your risk of bleeding or clotting while on Eliquis. To help avoid this, consult your healthcare provider or pharmacist prior to using any new prescription or non-prescription medications, including herbals, vitamins, non-steroidal anti-inflammatory drugs (NSAIDs) and supplements.  This website has more information on Eliquis (apixaban): http://www.eliquis.com/eliquis/home

## 2022-02-22 ENCOUNTER — Observation Stay (HOSPITAL_COMMUNITY): Payer: Commercial Managed Care - HMO

## 2022-02-22 DIAGNOSIS — I2699 Other pulmonary embolism without acute cor pulmonale: Secondary | ICD-10-CM | POA: Diagnosis present

## 2022-02-22 DIAGNOSIS — J9811 Atelectasis: Secondary | ICD-10-CM | POA: Diagnosis present

## 2022-02-22 DIAGNOSIS — M069 Rheumatoid arthritis, unspecified: Secondary | ICD-10-CM | POA: Diagnosis present

## 2022-02-22 DIAGNOSIS — I10 Essential (primary) hypertension: Secondary | ICD-10-CM | POA: Diagnosis present

## 2022-02-22 DIAGNOSIS — I2609 Other pulmonary embolism with acute cor pulmonale: Secondary | ICD-10-CM

## 2022-02-22 DIAGNOSIS — Z7984 Long term (current) use of oral hypoglycemic drugs: Secondary | ICD-10-CM | POA: Diagnosis not present

## 2022-02-22 DIAGNOSIS — R0602 Shortness of breath: Secondary | ICD-10-CM | POA: Diagnosis present

## 2022-02-22 DIAGNOSIS — Z794 Long term (current) use of insulin: Secondary | ICD-10-CM | POA: Diagnosis not present

## 2022-02-22 DIAGNOSIS — F17210 Nicotine dependence, cigarettes, uncomplicated: Secondary | ICD-10-CM | POA: Diagnosis present

## 2022-02-22 DIAGNOSIS — M7121 Synovial cyst of popliteal space [Baker], right knee: Secondary | ICD-10-CM | POA: Diagnosis present

## 2022-02-22 DIAGNOSIS — E119 Type 2 diabetes mellitus without complications: Secondary | ICD-10-CM | POA: Diagnosis present

## 2022-02-22 DIAGNOSIS — Z833 Family history of diabetes mellitus: Secondary | ICD-10-CM | POA: Diagnosis not present

## 2022-02-22 DIAGNOSIS — Z79899 Other long term (current) drug therapy: Secondary | ICD-10-CM | POA: Diagnosis not present

## 2022-02-22 LAB — ECHOCARDIOGRAM COMPLETE
AR max vel: 3.2 cm2
AV Peak grad: 5.1 mmHg
Ao pk vel: 1.13 m/s
Area-P 1/2: 4.26 cm2
Height: 72 in
S' Lateral: 4.2 cm
Weight: 4740.77 oz

## 2022-02-22 LAB — HEPARIN LEVEL (UNFRACTIONATED)
Heparin Unfractionated: <0.1 IU/mL — ABNORMAL LOW (ref 0.30–0.70)
Heparin Unfractionated: 0.34 IU/mL (ref 0.30–0.70)

## 2022-02-22 LAB — CBG MONITORING, ED
Glucose-Capillary: 256 mg/dL — ABNORMAL HIGH (ref 70–99)
Glucose-Capillary: 228 mg/dL — ABNORMAL HIGH (ref 70–99)
Glucose-Capillary: 132 mg/dL — ABNORMAL HIGH (ref 70–99)
Glucose-Capillary: 146 mg/dL — ABNORMAL HIGH (ref 70–99)
Glucose-Capillary: 85 mg/dL (ref 70–99)

## 2022-02-22 LAB — HEMOGLOBIN A1C
Hgb A1c MFr Bld: 12.2 % — ABNORMAL HIGH (ref 4.8–5.6)
Mean Plasma Glucose: 303.44 mg/dL

## 2022-02-22 LAB — HIV ANTIBODY (ROUTINE TESTING W REFLEX): HIV Screen 4th Generation wRfx: NONREACTIVE

## 2022-02-22 MED ORDER — ONDANSETRON HCL 4 MG PO TABS: 4.0000 mg | ORAL_TABLET | Freq: Four times a day (QID) | ORAL | Status: DC | PRN

## 2022-02-22 MED ORDER — ACETAMINOPHEN 325 MG PO TABS: 650.0000 mg | ORAL_TABLET | Freq: Four times a day (QID) | ORAL | Status: DC | PRN

## 2022-02-22 MED ORDER — INSULIN ASPART 100 UNIT/ML IJ SOLN
0.0000 [IU] | Freq: Three times a day (TID) | INTRAMUSCULAR | Status: DC
  Administered 2022-02-22 (×2): 2 [IU] via SUBCUTANEOUS
  Administered 2022-02-22: 8 [IU] via SUBCUTANEOUS

## 2022-02-22 MED ORDER — ONDANSETRON HCL 4 MG/2ML IJ SOLN: 4.0000 mg | Freq: Four times a day (QID) | INTRAMUSCULAR | Status: DC | PRN

## 2022-02-22 MED ORDER — HEPARIN BOLUS VIA INFUSION
5000.0000 [IU] | Freq: Once | INTRAVENOUS | Status: AC
  Administered 2022-02-22: 5000 [IU] via INTRAVENOUS
  Filled 2022-02-22: qty 5000

## 2022-02-22 MED ORDER — INSULIN DETEMIR 100 UNIT/ML ~~LOC~~ SOLN
30.0000 [IU] | Freq: Every day | SUBCUTANEOUS | Status: DC
  Administered 2022-02-22: 30 [IU] via SUBCUTANEOUS
  Filled 2022-02-22 (×2): qty 0.3

## 2022-02-22 MED ORDER — HYDROCODONE-ACETAMINOPHEN 5-325 MG PO TABS
1.0000 | ORAL_TABLET | ORAL | Status: DC | PRN
  Administered 2022-02-22 – 2022-02-23 (×4): 1 via ORAL
  Filled 2022-02-22 (×4): qty 1

## 2022-02-22 MED ORDER — ACETAMINOPHEN 650 MG RE SUPP: 650.0000 mg | Freq: Four times a day (QID) | RECTAL | Status: DC | PRN

## 2022-02-22 MED ORDER — INSULIN DETEMIR 100 UNIT/ML ~~LOC~~ SOLN
20.0000 [IU] | Freq: Two times a day (BID) | SUBCUTANEOUS | Status: DC
  Administered 2022-02-22: 20 [IU] via SUBCUTANEOUS
  Filled 2022-02-22 (×5): qty 0.2

## 2022-02-22 MED ORDER — LISINOPRIL 20 MG PO TABS
20.0000 mg | ORAL_TABLET | Freq: Every day | ORAL | Status: DC
  Administered 2022-02-22 – 2022-02-23 (×2): 20 mg via ORAL
  Filled 2022-02-22 (×2): qty 1

## 2022-02-22 MED ORDER — INSULIN ASPART 100 UNIT/ML IJ SOLN
6.0000 [IU] | Freq: Three times a day (TID) | INTRAMUSCULAR | Status: DC
  Administered 2022-02-22 (×3): 6 [IU] via SUBCUTANEOUS

## 2022-02-22 NOTE — Progress Notes (Signed)
[facility] for Heparin Indication: pulmonary embolus  No Known Allergies  Patient Measurements: Height: 6' (182.9 cm) Weight: 134.4 kg (296 lb 4.8 oz) IBW/kg (Calculated) : 77.6 Heparin Dosing Weight: 108.2 kg  Vital Signs: Temp: 98 F (36.7 C) (10/05 1300) Temp Source: Oral (10/05 0856) BP: 146/102 (10/05 1300) Pulse Rate: 88 (10/05 1300)  Labs: Recent Labs    02/21/22 1825 02/21/22 1950 02/22/22 0513 02/22/22 1426  HGB 14.1  --   --   --   HCT 42.6  --   --   --   PLT 362  --   --   --   LABPROT  --  12.5  --   --   INR  --  0.9  --   --   HEPARINUNFRC  --   --  <0.10* 0.34  CREATININE  --  0.82  --   --   TROPONINIHS  --  5  --   --      Estimated Creatinine Clearance: 147.8 mL/min (by C-G formula based on SCr of 0.82 mg/dL).   Medical History: Past Medical History:  Diagnosis Date   Diabetes mellitus without complication ([place])    Hypertension       Assessment: 59 yom with a history of DM, HTN, RA. Patient is presenting with Leg swelling/ chest pain . Heparin per pharmacy consult placed for pulmonary embolus.  CTA PE w/ acute bilateral lobar and segmental PE w/ evidence of RHS  Patient is not on anticoagulation prior to arrival.  Hgb 14.1; plt 362  10/5 PM update:  Heparin level therapeutic   Goal of Therapy:  Heparin level 0.3-0.7 units/ml Monitor platelets by anticoagulation protocol: Yes   Plan:   Continue heparin infusion to 2300 units/hr Heparin level in 8 hours  Alanda Slim, PharmD, [facility] Clinical Pharmacist Please see AMION for all Pharmacists' Contact Phone Numbers 02/22/2022, 3:22 PM

## 2022-02-22 NOTE — H&P (Signed)
History and Physical  Patient Name: Jeremiah Alexander     [id number]    DOB: May 19, 1971    DOA: 02/21/2022 PCP: [facility]  Chief Complaint: sob  HPI: Jeremiah Alexander is a 51 y.o. with history of type 2 diabetes on insulin, hypertension who presented to the emergency department due to leg pain and pleuritic cp.  Patient is on lisinopril, over-the-counter insulin and metformin.  Patient has poor access to care and needed his medications.  He is also noted worsening swelling in his legs over the last 6 months. Over the last 1 week he had has pleurtic CP and mild SOB.  He presented to the ER where he was found to be afebrile and hemodynamically stable.  He was hypertensive with systolics in the [id number].  Labs were obtained which demonstrated unremarkable CBC, BnP 11.8, BMP showed sodium 132, glucose 365.  D-dimer was obtained due to leg swelling which was elevated at 6.5.  Troponin was within normal limits.  Chest x-ray was obtained which showed atelectasis.  CT PE study demonstrated acute bilateral lobar and segmental PEs with evidence of right heart strain there is also linear consolidations thought to be due to atelectasis.  Patient was admitted for submassive pulmonary embolism.  On admission, patient was resting comfortably on room air. He denies hx of plan travel though is immobile due to leg injury. No fh of clotting disorders. HE has never had a PE before. He is not UTD on cancer screening.   ROS: Negative unless noted   Past Medical History:  Diagnosis Date   Diabetes mellitus without complication ([place])    Hypertension     Past Surgical History:  Procedure Laterality Date   TOE AMPUTATION     Right Pinky Toe    Social History:   No Known Allergies  Family history: family history includes Diabetes in an other family member.  Prior to Admission medications   Medication Sig Start Date End Date Taking? Authorizing Provider  HYDROcodone-acetaminophen (NORCO) 5-325 MG  tablet Take 1 tablet by mouth every 4 (four) hours as needed for moderate pain. 02/08/21   Merlyn Lot, MD  ibuprofen (ADVIL) 600 MG tablet Take 1 tablet (600 mg total) by mouth every 8 (eight) hours as needed. 07/06/19   Sable Feil, PA-C  insulin aspart protamine- aspart (NOVOLOG MIX 70/30) (70-30) 100 UNIT/ML injection Inject 0.45 mLs (45 Units total) into the skin 2 (two) times daily with a meal. 02/21/22   Cristie Hem, MD  lisinopril (ZESTRIL) 20 MG tablet Take 1 tablet (20 mg total) by mouth daily. 02/21/22   Cristie Hem, MD  metFORMIN (GLUCOPHAGE) 500 MG tablet Take 1 tablet (500 mg total) by mouth 2 (two) times daily with a meal. 02/21/22   Cristie Hem, MD       Physical Exam: BP (!) 162/102 (BP Location: Right Arm)   Pulse 82   Temp 99.3 F (37.4 C) (Oral)   Resp 16   Ht 6' (1.829 m)   Wt 134.4 kg   SpO2 98%   BMI 40.19 kg/m  General appearance: Not in acute distress Eyes: Anicteric, conjunctiva pink, lids and lashes normal. PERRL.    ENT: No nasal deformity, discharge, epistaxis.  Lymph: No cervical or supraclavicular lymphadenopathy. Skin: Warm and dry.  No jaundice.  No suspicious rashes or lesions. Cardiac: RRR, nl S1-S2, no murmurs appreciated. No edema Respiratory: Normal respiratory rate and rhythm. Abdomen: Abdomen soft, nontender.  MSK: No  deformities or effusions of the large joints of the upper or lower extremities bilaterally.  Neuro: No focal neurologic deficits.  Psych: Normal mood and affect     Labs on Admission:  I have personally reviewed following labs and imaging studies: CBC: Recent Labs  Lab 02/21/22 1825  WBC 8.6  NEUTROABS 6.2  HGB 14.1  HCT 42.6  MCV 89.1  PLT 123XX123   Basic Metabolic Panel: Recent Labs  Lab 02/21/22 1950  NA 132*  K 4.1  CL 96*  CO2 26  GLUCOSE 365*  BUN 5*  CREATININE 0.82  CALCIUM 9.0   GFR: Estimated Creatinine Clearance: 147.8 mL/min (by C-G formula based on SCr of 0.82  mg/dL).  Liver Function Tests: No results for input(s): "AST", "ALT", "ALKPHOS", "BILITOT", "PROT", "ALBUMIN" in the last 168 hours. No results for input(s): "LIPASE", "AMYLASE" in the last 168 hours. No results for input(s): "AMMONIA" in the last 168 hours. Coagulation Profile: Recent Labs  Lab 02/21/22 1950  INR 0.9   Cardiac Enzymes: No results for input(s): "CKTOTAL", "CKMB", "CKMBINDEX", "TROPONINI" in the last 168 hours. BNP (last 3 results) No results for input(s): "PROBNP" in the last 8760 hours.  Radiological Exams on Admission:  CT Angio Chest PE W and/or Wo Contrast  Result Date: 02/21/2022 CLINICAL DATA:  Chest pain for several weeks, lower extremity swelling, flank pain EXAM: CT ANGIOGRAPHY CHEST WITH CONTRAST TECHNIQUE: Multidetector CT imaging of the chest was performed using the standard protocol during bolus administration of intravenous contrast. Multiplanar CT image reconstructions and MIPs were obtained to evaluate the vascular anatomy. RADIATION DOSE REDUCTION: This exam was performed according to the departmental dose-optimization program which includes automated exposure control, adjustment of the mA and/or kV according to patient size and/or use of iterative reconstruction technique. CONTRAST:  16mL OMNIPAQUE IOHEXOL 350 MG/ML SOLN COMPARISON:  02/21/2022 FINDINGS: Cardiovascular: This is a technically adequate evaluation of the pulmonary vasculature. There are bilateral lobar and segmental pulmonary emboli, greatest within the lower lobes. Moderate clot burden, with dilated RV/LV ratio measuring 1.29 consistent with right heart strain. No pericardial effusion. Atherosclerosis of the coronary vasculature. Normal caliber of the thoracic aorta, with mild atherosclerosis of the aortic arch. Mediastinum/Nodes: No enlarged mediastinal, hilar, or axillary lymph nodes. Thyroid gland, trachea, and esophagus demonstrate no significant findings. Lungs/Pleura: Mild emphysema at the  lung apices. Linear areas of consolidation within the dependent lower lobes, favor hypoventilatory changes over pulmonary infarct. Trace right pleural effusion. No pneumothorax. Central airways are patent. Upper Abdomen: No acute abnormality. Musculoskeletal: No acute or destructive bony lesions. Reconstructed images demonstrate no additional findings. Review of the MIP images confirms the above findings. IMPRESSION: 1. Positive for acute bilateral lobar and segmental PE with CT evidence of right heart strain (RV/LV Ratio = 1.29) consistent with at least submassive (intermediate risk) PE. The presence of right heart strain has been associated with an increased risk of morbidity and mortality. Please refer to the "Code PE Focused" order set in EPIC. 2. Bibasilar areas of linear consolidation, favor atelectasis over pulmonary infarct. 3. Aortic Atherosclerosis (ICD10-I70.0) and Emphysema (ICD10-J43.9). Critical Value/emergent results were called by telephone at the time of interpretation on 02/21/2022 at 11:21 pm to provider Garnette Gunner , who verbally acknowledged these results. Electronically Signed   By: Randa Ngo M.D.   On: 02/21/2022 23:23   VAS [place] LOWER EXTREMITY VENOUS (DVT) (7a-7p)  Result Date: 02/21/2022  Lower Venous DVT Study Patient Name:  THEON DIOP  Date of Exam:  02/21/2022 Medical Rec #: [id number]       Accession #:    [id number] Date of Birth: [birth year]       Patient Gender: M Patient Age:   68 years Exam Location:  [facility] Procedure:      VAS [place] LOWER EXTREMITY VENOUS (DVT) Referring Phys: Garnette Gunner --------------------------------------------------------------------------------  Indications: Edema, Pain, and rheumatoid arthritis.  Comparison Study: No prior study Performing Technologist: Maudry Mayhew MHA, RDMS, RVT, RDCS  Examination Guidelines: A complete evaluation includes B-mode imaging, spectral Doppler, color Doppler, and power Doppler as needed of all  accessible portions of each vessel. Bilateral testing is considered an integral part of a complete examination. Limited examinations for reoccurring indications may be performed as noted. The reflux portion of the exam is performed with the patient in reverse Trendelenburg.  +---------+---------------+---------+-----------+----------+--------------+ RIGHT    CompressibilityPhasicitySpontaneityPropertiesThrombus Aging +---------+---------------+---------+-----------+----------+--------------+ CFV      Full           Yes      Yes                                 +---------+---------------+---------+-----------+----------+--------------+ SFJ      Full                                                        +---------+---------------+---------+-----------+----------+--------------+ FV Prox  Full                                                        +---------+---------------+---------+-----------+----------+--------------+ FV Mid   Full                                                        +---------+---------------+---------+-----------+----------+--------------+ FV DistalFull                                                        +---------+---------------+---------+-----------+----------+--------------+ PFV      Full                                                        +---------+---------------+---------+-----------+----------+--------------+ POP      Full           Yes      Yes                                 +---------+---------------+---------+-----------+----------+--------------+ PTV      Full                                                        +---------+---------------+---------+-----------+----------+--------------+  PERO     Full                                                        +---------+---------------+---------+-----------+----------+--------------+   +----+---------------+---------+-----------+----------+--------------+  LEFTCompressibilityPhasicitySpontaneityPropertiesThrombus Aging +----+---------------+---------+-----------+----------+--------------+ CFV Full           Yes      Yes                                 +----+---------------+---------+-----------+----------+--------------+     Summary: RIGHT: - There is no evidence of deep vein thrombosis in the lower extremity.  - A complex cystic structure is found in the popliteal fossa.  LEFT: - No evidence of common femoral vein obstruction.  *See table(s) above for measurements and observations. Electronically signed by Harold Barban MD on 02/21/2022 at 10:36:32 PM.    Final    DG Chest 1 View  Result Date: 02/21/2022 CLINICAL DATA:  Chest pain EXAM: CHEST  1 VIEW COMPARISON:  Chest x-ray 10/12/2019 FINDINGS: There is bibasilar atelectasis in the lung bases. There is blunting of the right costophrenic angle. Cardiomediastinal silhouette is within normal limits. No evidence for pneumothorax or acute fracture. IMPRESSION: 1. Bibasilar atelectasis. 2. Blunting of the right costophrenic angle may be due to pleural thickening or small effusion. Electronically Signed   By: Ronney Asters M.D.   On: 02/21/2022 18:05     Assessment/Plan Mr. Cass is a 51 year old with history of hypertension and type 2 diabetes who presented with leg swelling was found to have submassive pulmonary embolism and started on heparin drip.  Patient showed evidence of right heart strain on CT scans we will obtain echo in the morning and continue heparin.  He has poor access to care and is uninsured so likely will need assistance with 6 months of anticoagulation.  Submassive pulmonary embolism, POA, active - Patient hemodynamically stable although evidence of right heart strain on CT Plan: Obtain echocardiogram Continue heparin drip  Hypertension-resume home lisinopril 20 mg daily Type 2 diabetes-placed on basal bolus regimen with correction    DVT prophylaxis: hep gtt  Code  Status: Full  Disposition Plan: Anticipate DC in 1-2 d Consults called: none Admission status: obs   At the point of initial evaluation, it is my clinical opinion that admission for OBSERVATION is reasonable and necessary because the patient's presenting complaints in the context of their chronic conditions represent sufficient risk of deterioration or significant morbidity to constitute reasonable grounds for close observation in the hospital setting, but that the patient may be medically stable for discharge from the hospital within 24 to 48 hours.    Medical decision making: Patient seen at 12:54 AM on 02/22/2022. What exists of the patient's chart was reviewed in depth and summarized above.    Emilee Hero [facility] Please page though [place] or Epic secure chat:  For password, contact charge nurse

## 2022-02-22 NOTE — Progress Notes (Signed)
Jeremiah Alexander for Heparin Indication: pulmonary embolus  No Known Allergies  Patient Measurements: Height: 6' (182.9 cm) Weight: 134.4 kg (296 lb 4.8 oz) IBW/kg (Calculated) : 77.6 Heparin Dosing Weight: 108.2 kg  Vital Signs: Temp: 98 F (36.7 C) (10/05 0432) Temp Source: Oral (10/05 0432) BP: 146/93 (10/05 0600) Pulse Rate: 83 (10/05 0600)  Labs: Recent Labs    02/21/22 1825 02/21/22 1950 02/22/22 0513  HGB 14.1  --   --   HCT 42.6  --   --   PLT 362  --   --   LABPROT  --  12.5  --   INR  --  0.9  --   HEPARINUNFRC  --   --  <0.10*  CREATININE  --  0.82  --   TROPONINIHS  --  5  --      Estimated Creatinine Clearance: 147.8 mL/min (by C-G formula based on SCr of 0.82 mg/dL).   Medical History: Past Medical History:  Diagnosis Date   Diabetes mellitus without complication ([place])    Hypertension       Assessment: 7 yom with a history of DM, HTN, RA. Patient is presenting with Leg swelling/ chest pain . Heparin per pharmacy consult placed for pulmonary embolus.  CTA PE w/ acute bilateral lobar and segmental PE w/ evidence of RHS  Patient is not on anticoagulation prior to arrival.  Hgb 14.1; plt 362  10/5 AM update:  Heparin level sub-therapeutic   Goal of Therapy:  Heparin level 0.3-0.7 units/ml Monitor platelets by anticoagulation protocol: Yes   Plan:  Give IV heparin 5000 units bolus x 1 Increase heparin infusion to 2300 units/hr Heparin level in 8 hours  [name], PharmD, [facility] Phone: [phone]

## 2022-02-22 NOTE — Care Management (Signed)
ED RNCM met with patient at bedside last night before admission, to discuss medication and PCP assistance.  Patient reported losing his job and insurance and had been having difficulties with obtaining medication and follow up appointment.  Discussed [facility] medication assistance  program and the internal medicine clinic for follow up patient was agreeable, ED evaluation was still pending.   Spoke with Dr. Florene Glen who will send  discharge prescriptions to [place] tomorrow. TOC will continue to follow.

## 2022-02-22 NOTE — Progress Notes (Signed)
PROGRESS NOTE    Jeremiah Alexander  VFI:[id number] DOB: 04-07-1971 DOA: 02/21/2022 PCP: [facility]  No chief complaint on file.   Brief Narrative:  Jeremiah Alexander is Jeremiah Alexander 51 y.o. with history of type 2 diabetes on insulin, hypertension who presented to the emergency department due to leg pain and pleuritic cp.  Patient is on lisinopril, over-the-counter insulin and metformin.  Patient has poor access to care and needed his medications.  He is also noted worsening swelling in his legs over the last 6 months. Over the last 1 week he had has pleurtic CP and mild SOB.  He presented to the ER where he was found to be afebrile and hemodynamically stable.  He was hypertensive with systolics in the 160s.  Labs were obtained which demonstrated unremarkable CBC, BnP 11.8, BMP showed sodium 132, glucose 365.  D-dimer was obtained due to leg swelling which was elevated at 6.5.  Troponin was within normal limits.  Chest x-ray was obtained which showed atelectasis.  CT PE study demonstrated acute bilateral lobar and segmental PEs with evidence of right heart strain there is also linear consolidations thought to be due to atelectasis.  Patient was admitted for submassive pulmonary embolism.   On admission, patient was resting comfortably on room air. He denies hx of plan travel though is immobile due to leg injury. No fh of clotting disorders. HE has never had Jeremiah Alexander PE before. He is not UTD on cancer screening.   Assessment & Plan:   Principal Problem:   Pulmonary embolism without acute cor pulmonale (HCC)   Assessment and Plan: Acute Pulmonary Embolism  Provoked in setting of hx of rheumatoid arthritis?  No travel, malignancy, recent surgery, injury.  Some family history in sister. CT with bilateral lobar and segmental PE with evidence of right heart strain PESI class 1, but given concern for right heart strain on CT, will need to get echo to better evaluate risk LE [place] without DVT noted Heparin  gtt for now, consider transition to PO eliquis after echo complete  Rheumatoid arthritis Was seeing Jeremiah Alexander at [facility] (care everywhere) and was on methotrexate (they'd discussed enbrel and it looks like this was prescribed) Will see if I can call Jeremiah Alexander regarding recommendatinos given loss of insurance  T2DM Basal/bolus regimen, reduced while inpatient - adjust prn (on 70/30 45 units BID at home with metformin) A1c pending  Hypertension Continue lisinopril  Adjust prn    DVT prophylaxis: heparin gtt Code Status: full Family Communication: none Disposition:   Status is: Observation The patient remains OBS appropriate and will d/c before 2 midnights.   Consultants:  none  Procedures:  LE [place] Summary:  RIGHT:  - There is no evidence of deep vein thrombosis in the lower extremity.     - Jeremiah Alexander complex cystic structure is found in the popliteal fossa.     LEFT:  - No evidence of common femoral vein obstruction.   Echo pending  Antimicrobials:  Anti-infectives (From admission, onward)    None       Subjective: Denies personal hx travel, cancer.  Does note hx rheumatoid arthritis, not currently on treatment due to loss of insurance. Feels about the same.  Objective: Vitals:   02/22/22 0432 02/22/22 0600 02/22/22 0800 02/22/22 0856  BP:  (!) 146/93 (!) 157/93   Pulse:  83 83   Resp:  18 19   Temp: 98 F (36.7 C)   98.7 F (37.1 C)  TempSrc: Oral   Oral  SpO2:  95% 98%   Weight:      Height:       No intake or output data in the 24 hours ending 02/22/22 0921 Filed Weights   02/21/22 2333  Weight: 134.4 kg    Examination:  General exam: Appears calm and comfortable lying in stretcher Respiratory system: Clear to auscultation. Respiratory effort normal. Cardiovascular system: S1 & S2 heard, RRR Gastrointestinal system: Abdomen is nondistended, soft and nontender.  Central nervous system: Alert and oriented. No focal neurological  deficits. Extremities: R>L lower extremity edema, palpable bilateral DP pulses  Data Reviewed: I have personally reviewed following labs and imaging studies  CBC: Recent Labs  Lab 02/21/22 1825  WBC 8.6  NEUTROABS 6.2  HGB 14.1  HCT 42.6  MCV 89.1  PLT 362    Basic Metabolic Panel: Recent Labs  Lab 02/21/22 1950  NA 132*  K 4.1  CL 96*  CO2 26  GLUCOSE 365*  BUN 5*  CREATININE 0.82  CALCIUM 9.0    GFR: Estimated Creatinine Clearance: 147.8 mL/min (by C-G formula based on SCr of 0.82 mg/dL).  Liver Function Tests: No results for input(s): "AST", "ALT", "ALKPHOS", "BILITOT", "PROT", "ALBUMIN" in the last 168 hours.  CBG: Recent Labs  Lab 02/22/22 0146 02/22/22 0837  GLUCAP 256* 228*     No results found for this or any previous visit (from the past 240 hour(s)).       Radiology Studies: CT Angio Chest PE W and/or Wo Contrast  Result Date: 02/21/2022 CLINICAL DATA:  Chest pain for several weeks, lower extremity swelling, flank pain EXAM: CT ANGIOGRAPHY CHEST WITH CONTRAST TECHNIQUE: Multidetector CT imaging of the chest was performed using the standard protocol during bolus administration of intravenous contrast. Multiplanar CT image reconstructions and MIPs were obtained to evaluate the vascular anatomy. RADIATION DOSE REDUCTION: This exam was performed according to the departmental dose-optimization program which includes automated exposure control, adjustment of the mA and/or kV according to patient size and/or use of iterative reconstruction technique. CONTRAST:  56mL OMNIPAQUE IOHEXOL 350 MG/ML SOLN COMPARISON:  02/21/2022 FINDINGS: Cardiovascular: This is Jeremiah Alexander technically adequate evaluation of the pulmonary vasculature. There are bilateral lobar and segmental pulmonary emboli, greatest within the lower lobes. Moderate clot burden, with dilated RV/LV ratio measuring 1.29 consistent with right heart strain. No pericardial effusion. Atherosclerosis of the coronary  vasculature. Normal caliber of the thoracic aorta, with mild atherosclerosis of the aortic arch. Mediastinum/Nodes: No enlarged mediastinal, hilar, or axillary lymph nodes. Thyroid gland, trachea, and esophagus demonstrate no significant findings. Lungs/Pleura: Mild emphysema at the lung apices. Linear areas of consolidation within the dependent lower lobes, favor hypoventilatory changes over pulmonary infarct. Trace right pleural effusion. No pneumothorax. Central airways are patent. Upper Abdomen: No acute abnormality. Musculoskeletal: No acute or destructive bony lesions. Reconstructed images demonstrate no additional findings. Review of the MIP images confirms the above findings. IMPRESSION: 1. Positive for acute bilateral lobar and segmental PE with CT evidence of right heart strain (RV/LV Ratio = 1.29) consistent with at least submassive (intermediate risk) PE. The presence of right heart strain has been associated with an increased risk of morbidity and mortality. Please refer to the "Code PE Focused" order set in EPIC. 2. Bibasilar areas of linear consolidation, favor atelectasis over pulmonary infarct. 3. Aortic Atherosclerosis (ICD10-I70.0) and Emphysema (ICD10-J43.9). Critical Value/emergent results were called by telephone at the time of interpretation on 02/21/2022 at 11:21 pm to provider Alvino Blood , who  verbally acknowledged these results. Electronically Signed   By: Randa Ngo M.D.   On: 02/21/2022 23:23   VAS [place] LOWER EXTREMITY VENOUS (DVT) (7a-7p)  Result Date: 02/21/2022  Lower Venous DVT Study Patient Name:  PROSPER PAFF  Date of Exam:   02/21/2022 Medical Rec #: [id number]       Accession #:    [id number] Date of Birth: Aug 14, 1970       Patient Gender: M Patient Age:   46 years Exam Location:  [facility] Procedure:      VAS [place] LOWER EXTREMITY VENOUS (DVT) Referring Phys: Garnette Gunner --------------------------------------------------------------------------------   Indications: Edema, Pain, and rheumatoid arthritis.  Comparison Study: No prior study Performing Technologist: Maudry Mayhew MHA, RDMS, RVT, RDCS  Examination Guidelines: Ethelmae Ringel complete evaluation includes B-mode imaging, spectral Doppler, color Doppler, and power Doppler as needed of all accessible portions of each vessel. Bilateral testing is considered an integral part of Cassey Hurrell complete examination. Limited examinations for reoccurring indications may be performed as noted. The reflux portion of the exam is performed with the patient in reverse Trendelenburg.  +---------+---------------+---------+-----------+----------+--------------+ RIGHT    CompressibilityPhasicitySpontaneityPropertiesThrombus Aging +---------+---------------+---------+-----------+----------+--------------+ CFV      Full           Yes      Yes                                 +---------+---------------+---------+-----------+----------+--------------+ SFJ      Full                                                        +---------+---------------+---------+-----------+----------+--------------+ FV Prox  Full                                                        +---------+---------------+---------+-----------+----------+--------------+ FV Mid   Full                                                        +---------+---------------+---------+-----------+----------+--------------+ FV DistalFull                                                        +---------+---------------+---------+-----------+----------+--------------+ PFV      Full                                                        +---------+---------------+---------+-----------+----------+--------------+ POP      Full           Yes      Yes                                 +---------+---------------+---------+-----------+----------+--------------+  PTV      Full                                                         +---------+---------------+---------+-----------+----------+--------------+ PERO     Full                                                        +---------+---------------+---------+-----------+----------+--------------+   +----+---------------+---------+-----------+----------+--------------+ LEFTCompressibilityPhasicitySpontaneityPropertiesThrombus Aging +----+---------------+---------+-----------+----------+--------------+ CFV Full           Yes      Yes                                 +----+---------------+---------+-----------+----------+--------------+     Summary: RIGHT: - There is no evidence of deep vein thrombosis in the lower extremity.  - Ulysses Alper complex cystic structure is found in the popliteal fossa.  LEFT: - No evidence of common femoral vein obstruction.  *See table(s) above for measurements and observations. Electronically signed by Coral Else MD on 02/21/2022 at 10:36:32 PM.    Final    DG Chest 1 View  Result Date: 02/21/2022 CLINICAL DATA:  Chest pain EXAM: CHEST  1 VIEW COMPARISON:  Chest x-ray 10/12/2019 FINDINGS: There is bibasilar atelectasis in the lung bases. There is blunting of the right costophrenic angle. Cardiomediastinal silhouette is within normal limits. No evidence for pneumothorax or acute fracture. IMPRESSION: 1. Bibasilar atelectasis. 2. Blunting of the right costophrenic angle may be due to pleural thickening or small effusion. Electronically Signed   By: Darliss Cheney M.D.   On: 02/21/2022 18:05        Scheduled Meds:  insulin aspart  0-24 Units Subcutaneous TID WC   insulin aspart  6 Units Subcutaneous TID WC   insulin detemir  30 Units Subcutaneous QHS   lisinopril  20 mg Oral Daily   Continuous Infusions:  heparin 2,300 Units/hr (02/22/22 0717)     LOS: 0 days    Time spent: over 30 min    Lacretia Nicks, MD [facility]   To contact the attending provider between 7A-7P or the covering provider during after hours 7P-7A,  please log into the web site [email or web address] and access using universal [place] password for that web site. If you do not have the password, please call the hospital operator.  02/22/2022, 9:21 AM

## 2022-02-23 ENCOUNTER — Other Ambulatory Visit (HOSPITAL_COMMUNITY): Payer: Self-pay

## 2022-02-23 ENCOUNTER — Telehealth (HOSPITAL_COMMUNITY): Payer: Self-pay | Admitting: Pharmacy Technician

## 2022-02-23 ENCOUNTER — Telehealth (HOSPITAL_COMMUNITY): Payer: Self-pay

## 2022-02-23 ENCOUNTER — Encounter (HOSPITAL_COMMUNITY): Payer: Self-pay | Admitting: Family Medicine

## 2022-02-23 DIAGNOSIS — I2699 Other pulmonary embolism without acute cor pulmonale: Principal | ICD-10-CM

## 2022-02-23 LAB — GLUCOSE, CAPILLARY: Glucose-Capillary: 127 mg/dL — ABNORMAL HIGH (ref 70–99)

## 2022-02-23 LAB — HEPARIN LEVEL (UNFRACTIONATED): Heparin Unfractionated: 0.45 IU/mL (ref 0.30–0.70)

## 2022-02-23 MED ORDER — APIXABAN 5 MG PO TABS
10.0000 mg | ORAL_TABLET | Freq: Two times a day (BID) | ORAL | Status: DC
  Administered 2022-02-23: 10 mg via ORAL
  Filled 2022-02-23: qty 2

## 2022-02-23 MED ORDER — LISINOPRIL 20 MG PO TABS
20.0000 mg | ORAL_TABLET | Freq: Every day | ORAL | 1 refills | Status: DC
  Filled 2022-02-23: qty 30, 30d supply, fill #0

## 2022-02-23 MED ORDER — INSULIN LISPRO PROT & LISPRO (75-25 MIX) 100 UNIT/ML KWIKPEN
25.0000 [IU] | PEN_INJECTOR | Freq: Two times a day (BID) | SUBCUTANEOUS | 3 refills | Status: DC
  Filled 2022-02-23: qty 15, 30d supply, fill #0

## 2022-02-23 MED ORDER — APIXABAN (ELIQUIS) VTE STARTER PACK (10MG AND 5MG)
ORAL_TABLET | ORAL | 0 refills | Status: DC
  Filled 2022-02-23: qty 74, 30d supply, fill #0

## 2022-02-23 MED ORDER — METFORMIN HCL 500 MG PO TABS
500.0000 mg | ORAL_TABLET | Freq: Two times a day (BID) | ORAL | 1 refills | Status: DC
  Filled 2022-02-23: qty 60, 30d supply, fill #0

## 2022-02-23 MED ORDER — APIXABAN 5 MG PO TABS: 5.0000 mg | ORAL_TABLET | Freq: Two times a day (BID) | ORAL | Status: DC

## 2022-02-23 MED ORDER — FINGERSTIX LANCETS MISC
0 refills | Status: AC
  Filled 2022-02-23: qty 100, 25d supply, fill #0

## 2022-02-23 MED ORDER — GLUCOSE BLOOD VI STRP
ORAL_STRIP | 0 refills | Status: AC
  Filled 2022-02-23: qty 100, 25d supply, fill #0

## 2022-02-23 MED ORDER — BLOOD GLUCOSE MONITOR KIT
PACK | 0 refills | Status: AC
  Filled 2022-02-23: qty 1, 30d supply, fill #0

## 2022-02-23 MED ORDER — PREDNISONE 5 MG PO TABS
5.0000 mg | ORAL_TABLET | Freq: Every day | ORAL | 0 refills | Status: DC
  Filled 2022-02-23: qty 14, 14d supply, fill #0

## 2022-02-23 MED ORDER — OXYCODONE HCL 5 MG PO TABS
5.0000 mg | ORAL_TABLET | Freq: Three times a day (TID) | ORAL | 0 refills | Status: AC | PRN
  Filled 2022-02-23: qty 9, 3d supply, fill #0

## 2022-02-23 MED ORDER — INSULIN PEN NEEDLE 31G X 8 MM MISC
0 refills | Status: DC
  Filled 2022-02-23: qty 100, 25d supply, fill #0

## 2022-02-23 NOTE — Telephone Encounter (Signed)
Received notification from [employer] regarding a prior authorization for Eliquis Starter Pack.  Authorization has been APPROVED from 02/23/2022 to 02/23/2023.   Per test claim, copay for 30 days supply is $68.88   Authorization # PA Case ID: [id number] Key #  [id number]

## 2022-02-23 NOTE — TOC Benefit Eligibility Note (Signed)
Patient Teacher, English as a foreign language completed.    The patient is currently admitted and upon discharge could be taking Eliquis Starter Pack.  Requires Prior Authorization  The patient is currently admitted and upon discharge could be taking Xarelto Starter Pack.  Requires Prior Authorization  The patient is insured through [place], [place] Patient Advocate Specialist [place] Patient Advocate Team Direct Number: [phone]  Fax: [phone]

## 2022-02-23 NOTE — Discharge Summary (Signed)
Physician Discharge Summary  Jeremiah Alexander WIO:[id number] DOB: [birth year] DOA: 02/21/2022  PCP: [place] date: 02/21/2022 Discharge date: 02/23/2022  Time spent: 40 minutes  Recommendations for Outpatient Follow-up:  Follow outpatient CBC/CMP  Continue anticoagulation 3-6 months uninterrupted (needs discussion with PCP prior to discontinuing - with suspected provoking factor being RA, prolonged/indefinite anticoagulation should be considered - consider hematology follow up)  Follow blood sugars/insulin regimen outpatient Follow with rheumatology, follow response to prednisone Follow blood pressure, adjust regimen as needed Needs to establish and follow with PCP and rheumatology  Discharge Diagnoses:  Principal Problem:   Pulmonary embolism without acute cor pulmonale ([place]) Active Problems:   Pulmonary embolism ([place])   Discharge Condition: stable  Diet recommendation: heart healthy  Filed Weights   02/21/22 2333  Weight: 134.4 kg    History of present illness:  Jeremiah Alexander is Jeremiah Alexander 51 y.o. with history of type 2 diabetes on insulin, hypertension who presented to the emergency department due to leg pain and pleuritic cp.  He was found to have PE with evidence of R heart strain on CT.  Echo did not show evidence of right heart strain.  Hemodynamically stable.  Discharged on eliquis.    See below for additional details  Hospital Course:  Assessment and Plan: Acute Pulmonary Embolism  Provoked in setting of hx of rheumatoid arthritis?  No travel, malignancy, recent surgery, injury.  Some family history in sister. CT with bilateral lobar and segmental PE with evidence of right heart strain PESI class 1, but given concern for right heart strain on CT, will need to get echo to better evaluate risk Echo with EF 42-68%, grade 1 diastolic dysfunction, normal RVSF, mildly enlarged RV size LE [place] without DVT noted Transition to eliquis, plan for 3-6 months.   Suspect provoking factor maybe rheumatoid arthritis?  Could consider prolonged/indefinite anticoagulation with RA being suspected cause.  Needs to be up to date with routine cancer screening.  Follow outpatient with PCP prior to discontinuing anticoagulation (consider discussion with rheum or heme outpatient).   Rheumatoid arthritis Was seeing Dr. Ephriam Jenkins at [facility] (care everywhere) and was on methotrexate (they'd discussed enbrel and it looks like this was prescribed) -> since his last visit he's moved to .  I'll refer to rheumatology in town (discussed briefly with Dr. Posey Pronto yesterday and due to his continued RA pain - bilateral knees-, will start low dose prednisone and see how he does with this).  He'll need to follow up with rheumatology outpatient.   T2DM Basal/bolus regimen, reduced while inpatient - adjust prn (on 70/30 45 units BID at home with metformin) A1c 12.2 Metformin.  Discharge on 25 units 70/30 twice daily   Hypertension Continue lisinopril  Adjust prn outpatient      Procedures: Echo IMPRESSIONS     1. Left ventricular ejection fraction, by estimation, is 50 to 55%. The  left ventricle has low normal function. The left ventricle has no regional  wall motion abnormalities. Left ventricular diastolic parameters are  consistent with Grade I diastolic  dysfunction (impaired relaxation).   2. Right ventricular systolic function is normal. The right ventricular  size is mildly enlarged. Tricuspid regurgitation signal is inadequate for  assessing PA pressure.   3. The mitral valve is normal in structure. Trivial mitral valve  regurgitation. No evidence of mitral stenosis.   4. The aortic valve is tricuspid. Aortic valve regurgitation is not  visualized. No aortic stenosis is present.  5. There is borderline dilatation of the ascending aorta, measuring 38  mm.   6. The inferior vena cava is dilated in size with >50% respiratory  variability, suggesting  right atrial pressure of 8 mmHg.    LE [place] Summary:  RIGHT:  - There is no evidence of deep vein thrombosis in the lower extremity.     - Eymi Lipuma complex cystic structure is found in the popliteal fossa.     LEFT:  - No evidence of common femoral vein obstruction.  Consultations: none  Discharge Exam: Vitals:   02/23/22 0400 02/23/22 0925  BP: 108/61 (!) 147/98  Pulse: 79 (!) 32  Resp: 15 16  Temp: 98.6 F (37 C) 98.6 F (37 C)  SpO2: 94% 94%   C/o pleuritic chest discomfort overnight and with deep breaths Discussed d/c plan with patient and sister (on phone)  General: No acute distress. Cardiovascular: RRR Lungs: unlabored Abdomen: Soft, nontender, nondistended Neurological: Alert and oriented 3. Moves all extremities 4 with equal strength. Cranial nerves II through XII grossly intact. Extremities: No clubbing or cyanosis. No edema.   Discharge Instructions   Discharge Instructions     Ambulatory referral to Rheumatology   Complete by: As directed    Call MD for:  difficulty breathing, headache or visual disturbances   Complete by: As directed    Call MD for:  extreme fatigue   Complete by: As directed    Call MD for:  hives   Complete by: As directed    Call MD for:  persistant dizziness or light-headedness   Complete by: As directed    Call MD for:  persistant nausea and vomiting   Complete by: As directed    Call MD for:  redness, tenderness, or signs of infection (pain, swelling, redness, odor or green/yellow discharge around incision site)   Complete by: As directed    Call MD for:  severe uncontrolled pain   Complete by: As directed    Call MD for:  temperature >100.4   Complete by: As directed    Diet - low sodium heart healthy   Complete by: As directed    Discharge instructions   Complete by: As directed    You were seen for blood clots in your lungs.  We've started you on treatment with blood thinners.  You'll go home with eliquis.  You need to  continue this for at least 3-6 months without interruption.  Before you stop this, you should have Wallie Lagrand discussion with your PCP about the risks and benefits of continuing anticoagulation.  I think with RA being the suspected provoking factor for your blood clot, Jihad Brownlow longer course of anticoagulation (indefinite) should be considered.  You may need to discuss this with your rheumatologist or follow up with hematology (your PCP can help with this).  It's really important that you get your refill of eliquis.  If there's an issue with cost, you may need to enroll in Shaul Trautman patient assistance program (I think you'll need to do this with your lack of insurance).    I believe this was probably related to inflammation from your rheumatoid arthritis.  You should reestablish with Baily Hovanec rheumatologist in [place].  I'll place Alania Overholt referral, but you should also discuss this with the [facility].  Because of your continued rheumatoid arthritis pain, I'll start you on prednisone at [facility] low dose.  See how this does for you and follow up with your PCP outpatient to discuss further.  Ultimately,  rheumatology follow up to consider other potential agents is going to be the most important thing.  I'm refilling your insulin at discharge.  We'll send you on 70/30 insulin, 25 units twice daily.  Watch your blood sugars.  You'll need to adjust your insulin with your PCP as an outpatient.  We'll refill your lisinopril at discharge.  You'll need to follow up with your PCP outpatient for repeat labs and additional monitoring and adjustment of your blood pressure medicines.  Make sure you're up to date with your routine cancer screening.  Return for new, recurrent, or worsening symptoms.  Please ask your PCP to request records from this hospitalization so they know what was done and what the next steps will be.   Increase activity slowly   Complete by: As directed       Allergies as of 02/23/2022   No Known Allergies       Medication List     STOP taking these medications    aspirin EC 81 MG tablet       TAKE these medications    Apixaban Starter Pack (10m and 540m Commonly known as: ELIQUIS STARTER PACK Take as directed on package: start with two-46m59mablets twice daily for 7 days. On day 8, switch to one-46mg27mblet twice daily.  Please ensure you follow up with your PCP outpatient to get refills!   blood glucose meter kit and supplies Kit Dispense based on patient and insurance preference. Use up to four times daily as directed.   Insulin Lispro Prot & Lispro (75-25) 100 UNIT/ML Kwikpen Commonly known as: HUMALOG 75/25 MIX Inject 25 Units into the skin 2 (two) times daily with Tyshawna Alarid meal.   Insulin Pen Needle 31G X 8 MM Misc use as needed to inject insulin.   lisinopril 20 MG tablet Commonly known as: ZESTRIL Take 1 tablet (20 mg total) by mouth daily.   lisinopril 20 MG tablet Commonly known as: ZESTRIL Take 1 tablet (20 mg total) by mouth daily. Start taking on: February 24, 2022   metFORMIN 500 MG tablet Commonly known as: GLUCOPHAGE Take 1 tablet (500 mg total) by mouth 2 (two) times daily with Mikhayla Phillis meal.   oxyCODONE 5 MG immediate release tablet Commonly known as: Roxicodone Take 1 tablet (5 mg total) by mouth every 8 (eight) hours as needed for up to 3 days.   predniSONE 5 MG tablet Commonly known as: DELTASONE Take 1 tablet (5 mg total) by mouth daily with breakfast for 14 days.        No Known Allergies  Follow-up Information      [facility] Follow up.   Why: Call tomorrow to schedule an Emergency Department follow up appointment. Contact information: [address]-[phone]             The results of significant diagnostics from this hospitalization (including imaging, microbiology, ancillary and laboratory) are listed below for reference.    Significant Diagnostic  Studies: ECHOCARDIOGRAM COMPLETE  Result Date: 02/22/2022    ECHOCARDIOGRAM REPORT   Patient Name:   HENRLEKENDRICK ALPERNe of Exam: 02/22/2022 Medical Rec #:  [id number]  Height:       72.0 in Accession #:    [id number] Weight:       296.3 lb Date of Birth:  [birth year]  BSA:  2.518 m Patient Age:    51 years       BP:           153/98 mmHg Patient Gender: M              HR:           81 bpm. Exam Location:  Inpatient Procedure: 2D Echo, Cardiac Doppler and Color Doppler Indications:    Pulmonary embolus  History:        Patient has no prior history of Echocardiogram examinations.                 Risk Factors:Diabetes and Hypertension.  Sonographer:    Jefferey Pica Referring Phys: [id number] [place]  1. Left ventricular ejection fraction, by estimation, is 50 to 55%. The left ventricle has low normal function. The left ventricle has no regional wall motion abnormalities. Left ventricular diastolic parameters are consistent with Grade I diastolic dysfunction (impaired relaxation).  2. Right ventricular systolic function is normal. The right ventricular size is mildly enlarged. Tricuspid regurgitation signal is inadequate for assessing PA pressure.  3. The mitral valve is normal in structure. Trivial mitral valve regurgitation. No evidence of mitral stenosis.  4. The aortic valve is tricuspid. Aortic valve regurgitation is not visualized. No aortic stenosis is present.  5. There is borderline dilatation of the ascending aorta, measuring 38 mm.  6. The inferior vena cava is dilated in size with >50% respiratory variability, suggesting right atrial pressure of 8 mmHg. FINDINGS  Left Ventricle: Left ventricular ejection fraction, by estimation, is 50 to 55%. The left ventricle has low normal function. The left ventricle has no regional wall motion abnormalities. The left ventricular internal cavity size was normal in size. There is no left ventricular hypertrophy. Left ventricular  diastolic parameters are consistent with Grade I diastolic dysfunction (impaired relaxation). Right Ventricle: The right ventricular size is mildly enlarged. No increase in right ventricular wall thickness. Right ventricular systolic function is normal. Tricuspid regurgitation signal is inadequate for assessing PA pressure. Left Atrium: Left atrial size was normal in size. Right Atrium: Right atrial size was normal in size. Pericardium: There is no evidence of pericardial effusion. Mitral Valve: The mitral valve is normal in structure. Trivial mitral valve regurgitation. No evidence of mitral valve stenosis. Tricuspid Valve: The tricuspid valve is normal in structure. Tricuspid valve regurgitation is trivial. No evidence of tricuspid stenosis. Aortic Valve: The aortic valve is tricuspid. Aortic valve regurgitation is not visualized. No aortic stenosis is present. Aortic valve peak gradient measures 5.1 mmHg. Pulmonic Valve: The pulmonic valve was normal in structure. Pulmonic valve regurgitation is not visualized. No evidence of pulmonic stenosis. Aorta: The aortic root is normal in size and structure. There is borderline dilatation of the ascending aorta, measuring 38 mm. Venous: The inferior vena cava is dilated in size with greater than 50% respiratory variability, suggesting right atrial pressure of 8 mmHg. IAS/Shunts: The interatrial septum was not well visualized.  LEFT VENTRICLE PLAX 2D LVIDd:         6.00 cm   Diastology LVIDs:         4.20 cm   LV e' medial:    5.08 cm/s LV PW:         1.10 cm   LV E/e' medial:  10.9 LV IVS:        1.00 cm   LV e' lateral:   7.83 cm/s LVOT diam:     2.00 cm  LV E/e' lateral: 7.1 LV SV:         60 LV SV Index:   24 LVOT Area:     3.14 cm  RIGHT VENTRICLE             IVC RV Basal diam:  2.90 cm     IVC diam: 2.30 cm RV Mid diam:    4.20 cm RV S prime:     12.40 cm/s TAPSE (M-mode): 2.4 cm LEFT ATRIUM             Index        RIGHT ATRIUM           Index LA diam:        4.00  cm 1.59 cm/m   RA Area:     15.90 cm LA Vol (A2C):   58.4 ml 23.19 ml/m  RA Volume:   41.30 ml  16.40 ml/m LA Vol (A4C):   54.3 ml 21.57 ml/m LA Biplane Vol: 58.6 ml 23.27 ml/m  AORTIC VALVE                 PULMONIC VALVE AV Area (Vmax): 3.20 cm     PV Vmax:       0.76 m/s AV Vmax:        113.00 cm/s  PV Peak grad:  2.3 mmHg AV Peak Grad:   5.1 mmHg LVOT Vmax:      115.00 cm/s LVOT Vmean:     69.800 cm/s LVOT VTI:       0.190 m  AORTA Ao Root diam: 3.70 cm Ao Asc diam:  3.80 cm MITRAL VALVE MV Area (PHT): 4.26 cm    SHUNTS MV Decel Time: 178 msec    Systemic VTI:  0.19 m MV E velocity: 55.50 cm/s  Systemic Diam: 2.00 cm MV Addalynne Golding velocity: 69.80 cm/s MV E/Caisen Mangas ratio:  0.80 Cherlynn Kaiser MD Electronically signed by Cherlynn Kaiser MD Signature Date/Time: 02/22/2022/3:54:58 PM    Final    CT Angio Chest PE W and/or Wo Contrast  Result Date: 02/21/2022 CLINICAL DATA:  Chest pain for several weeks, lower extremity swelling, flank pain EXAM: CT ANGIOGRAPHY CHEST WITH CONTRAST TECHNIQUE: Multidetector CT imaging of the chest was performed using the standard protocol during bolus administration of intravenous contrast. Multiplanar CT image reconstructions and MIPs were obtained to evaluate the vascular anatomy. RADIATION DOSE REDUCTION: This exam was performed according to the departmental dose-optimization program which includes automated exposure control, adjustment of the mA and/or kV according to patient size and/or use of iterative reconstruction technique. CONTRAST:  44m OMNIPAQUE IOHEXOL 350 MG/ML SOLN COMPARISON:  02/21/2022 FINDINGS: Cardiovascular: This is Payden Bonus technically adequate evaluation of the pulmonary vasculature. There are bilateral lobar and segmental pulmonary emboli, greatest within the lower lobes. Moderate clot burden, with dilated RV/LV ratio measuring 1.29 consistent with right heart strain. No pericardial effusion. Atherosclerosis of the coronary vasculature. Normal caliber of the thoracic  aorta, with mild atherosclerosis of the aortic arch. Mediastinum/Nodes: No enlarged mediastinal, hilar, or axillary lymph nodes. Thyroid gland, trachea, and esophagus demonstrate no significant findings. Lungs/Pleura: Mild emphysema at the lung apices. Linear areas of consolidation within the dependent lower lobes, favor hypoventilatory changes over pulmonary infarct. Trace right pleural effusion. No pneumothorax. Central airways are patent. Upper Abdomen: No acute abnormality. Musculoskeletal: No acute or destructive bony lesions. Reconstructed images demonstrate no additional findings. Review of the MIP images confirms the above findings. IMPRESSION: 1. Positive for acute bilateral lobar and segmental PE with  CT evidence of right heart strain (RV/LV Ratio = 1.29) consistent with at least submassive (intermediate risk) PE. The presence of right heart strain has been associated with an increased risk of morbidity and mortality. Please refer to the "Code PE Focused" order set in EPIC. 2. Bibasilar areas of linear consolidation, favor atelectasis over pulmonary infarct. 3. Aortic Atherosclerosis (ICD10-I70.0) and Emphysema (ICD10-J43.9). Critical Value/emergent results were called by telephone at the time of interpretation on 02/21/2022 at 11:21 pm to provider Garnette Gunner , who verbally acknowledged these results. Electronically Signed   By: Randa Ngo M.D.   On: 02/21/2022 23:23   VAS [place] LOWER EXTREMITY VENOUS (DVT) (7a-7p)  Result Date: 02/21/2022  Lower Venous DVT Study Patient Name:  AYRON FILLINGER  Date of Exam:   02/21/2022 Medical Rec #: [id number]       Accession #:    [id number] Date of Birth: [birth year]       Patient Gender: M Patient Age:   81 years Exam Location:  [facility] Procedure:      VAS [place] LOWER EXTREMITY VENOUS (DVT) Referring Phys: Garnette Gunner --------------------------------------------------------------------------------  Indications: Edema, Pain, and rheumatoid  arthritis.  Comparison Study: No prior study Performing Technologist: Maudry Mayhew MHA, RDMS, RVT, RDCS  Examination Guidelines: Annya Lizana complete evaluation includes B-mode imaging, spectral Doppler, color Doppler, and power Doppler as needed of all accessible portions of each vessel. Bilateral testing is considered an integral part of Champayne Kocian complete examination. Limited examinations for reoccurring indications may be performed as noted. The reflux portion of the exam is performed with the patient in reverse Trendelenburg.  +---------+---------------+---------+-----------+----------+--------------+ RIGHT    CompressibilityPhasicitySpontaneityPropertiesThrombus Aging +---------+---------------+---------+-----------+----------+--------------+ CFV      Full           Yes      Yes                                 +---------+---------------+---------+-----------+----------+--------------+ SFJ      Full                                                        +---------+---------------+---------+-----------+----------+--------------+ FV Prox  Full                                                        +---------+---------------+---------+-----------+----------+--------------+ FV Mid   Full                                                        +---------+---------------+---------+-----------+----------+--------------+ FV DistalFull                                                        +---------+---------------+---------+-----------+----------+--------------+ PFV      Full                                                        +---------+---------------+---------+-----------+----------+--------------+  POP      Full           Yes      Yes                                 +---------+---------------+---------+-----------+----------+--------------+ PTV      Full                                                         +---------+---------------+---------+-----------+----------+--------------+ PERO     Full                                                        +---------+---------------+---------+-----------+----------+--------------+   +----+---------------+---------+-----------+----------+--------------+ LEFTCompressibilityPhasicitySpontaneityPropertiesThrombus Aging +----+---------------+---------+-----------+----------+--------------+ CFV Full           Yes      Yes                                 +----+---------------+---------+-----------+----------+--------------+     Summary: RIGHT: - There is no evidence of deep vein thrombosis in the lower extremity.  - Jarriel Papillion complex cystic structure is found in the popliteal fossa.  LEFT: - No evidence of common femoral vein obstruction.  *See table(s) above for measurements and observations. Electronically signed by Harold Barban MD on 02/21/2022 at 10:36:32 PM.    Final    DG Chest 1 View  Result Date: 02/21/2022 CLINICAL DATA:  Chest pain EXAM: CHEST  1 VIEW COMPARISON:  Chest x-ray 10/12/2019 FINDINGS: There is bibasilar atelectasis in the lung bases. There is blunting of the right costophrenic angle. Cardiomediastinal silhouette is within normal limits. No evidence for pneumothorax or acute fracture. IMPRESSION: 1. Bibasilar atelectasis. 2. Blunting of the right costophrenic angle may be due to pleural thickening or small effusion. Electronically Signed   By: Ronney Asters M.D.   On: 02/21/2022 18:05    Microbiology: No results found for this or any previous visit (from the past 240 hour(s)).   Labs: Basic Metabolic Panel: Recent Labs  Lab 02/21/22 1950  NA 132*  K 4.1  CL 96*  CO2 26  GLUCOSE 365*  BUN 5*  CREATININE 0.82  CALCIUM 9.0   Liver Function Tests: No results for input(s): "AST", "ALT", "ALKPHOS", "BILITOT", "PROT", "ALBUMIN" in the last 168 hours. No results for input(s): "LIPASE", "AMYLASE" in the last 168 hours. No results  for input(s): "AMMONIA" in the last 168 hours. CBC: Recent Labs  Lab 02/21/22 1825  WBC 8.6  NEUTROABS 6.2  HGB 14.1  HCT 42.6  MCV 89.1  PLT 362   Cardiac Enzymes: No results for input(s): "CKTOTAL", "CKMB", "CKMBINDEX", "TROPONINI" in the last 168 hours. BNP: BNP (last 3 results) Recent Labs    02/21/22 1825  BNP 11.8    ProBNP (last 3 results) No results for input(s): "PROBNP" in the last 8760 hours.  CBG: Recent Labs  Lab 02/22/22 0837 02/22/22 1145 02/22/22 1757 02/22/22 2149 02/23/22 0943  GLUCAP 228* 132* 146* 85 127*       Signed:  Fayrene Helper MD.  [facility] 02/23/2022, 10:27 AM

## 2022-02-23 NOTE — TOC Transition Note (Addendum)
Transition of Care [facility]) - CM/SW Discharge Note   Patient Details  Name: Jeremiah Alexander MRN: [id number] Date of Birth: 06-13-1970  [facility] CM/SW Contact:  Verdell Carmine, RN Phone Number: 02/23/2022, 1:09 PM   Clinical Narrative:    Met with patient at bedside. Medications from TOC have arrived. Discussed with him the importance of calling to make an appointment on Monday ( if closed for the holiday then Tuesday) as the office is not currently open. Messaged RN that patient ready for DC and is calling a friend to pick him up  10 Eliquis card given to patient   Barriers to Discharge: No Barriers Identified   Patient Goals and CMS Choice Patient states their goals for this hospitalization and ongoing recovery are:: Discharge home      Discharge Placement                       Discharge Plan and Services                                     Social Determinants of Health (SDOH) Interventions     Readmission Risk Interventions     No data to display

## 2022-02-23 NOTE — Telephone Encounter (Signed)
Pharmacy Patient Advocate Encounter  Insurance verification completed.    The patient is insured through [employer]   The patient is currently admitted and ran test claims for the following: Eliquis Starter Pack, Xarelto Starter Pack.  Copays and coinsurance results were relayed to Inpatient clinical team.

## 2022-02-23 NOTE — Progress Notes (Signed)
[place] for Heparin Indication: pulmonary embolus  No Known Allergies  Patient Measurements: Height: 6' (182.9 cm) Weight: 134.4 kg (296 lb 4.8 oz) IBW/kg (Calculated) : 77.6 Heparin Dosing Weight: 108.2 kg  Vital Signs: Temp: 98.6 F (37 C) (10/06 0400) Temp Source: Oral (10/06 0400) BP: 108/61 (10/06 0400) Pulse Rate: 79 (10/06 0400)  Labs: Recent Labs    02/21/22 1825 02/21/22 1950 02/22/22 0513 02/22/22 1426 02/23/22 0402  HGB 14.1  --   --   --   --   HCT 42.6  --   --   --   --   PLT 362  --   --   --   --   LABPROT  --  12.5  --   --   --   INR  --  0.9  --   --   --   HEPARINUNFRC  --   --  <0.10* 0.34 0.45  CREATININE  --  0.82  --   --   --   TROPONINIHS  --  5  --   --   --      Estimated Creatinine Clearance: 147.8 mL/min (by C-G formula based on SCr of 0.82 mg/dL).   Medical History: Past Medical History:  Diagnosis Date   Diabetes mellitus without complication ([place])    Hypertension       Assessment: 29 yom with a history of DM, HTN, RA. Patient is presenting with Leg swelling/ chest pain . Heparin per pharmacy consult placed for pulmonary embolus.  CTA PE w/ acute bilateral lobar and segmental PE w/ evidence of RHS  Patient is not on anticoagulation prior to arrival.  Hgb 14.1; plt 362  10/6 AM update:  Heparin level therapeutic x 2  Goal of Therapy:  Heparin level 0.3-0.7 units/ml Monitor platelets by anticoagulation protocol: Yes   Plan:  Cont heparin 2300 units/hr Daily CBC/heparin level  Monitor for bleeding F/U transition to oral anti-coagulation   [name], PharmD, [place] Pharmacist Phone: [phone]

## 2022-02-23 NOTE — Progress Notes (Signed)
[place] for Apixaban Indication: pulmonary embolus  No Known Allergies  Patient Measurements: Height: 6' (182.9 cm) Weight: 134.4 kg (296 lb 4.8 oz) IBW/kg (Calculated) : 77.6   Vital Signs: Temp: 98.6 F (37 C) (10/06 0400) Temp Source: Oral (10/06 0400) BP: 108/61 (10/06 0400) Pulse Rate: 79 (10/06 0400)  Labs: Recent Labs    02/21/22 1825 02/21/22 1950 02/22/22 0513 02/22/22 1426 02/23/22 0402  HGB 14.1  --   --   --   --   HCT 42.6  --   --   --   --   PLT 362  --   --   --   --   LABPROT  --  12.5  --   --   --   INR  --  0.9  --   --   --   HEPARINUNFRC  --   --  <0.10* 0.34 0.45  CREATININE  --  0.82  --   --   --   TROPONINIHS  --  5  --   --   --     Estimated Creatinine Clearance: 147.8 mL/min (by C-G formula based on SCr of 0.82 mg/dL).   Assessment: 51 YO male not on anticoagulation PTA presented with CP found to have acute bilateral lobar and segmental PE w/ evidence of RHS on CTA. Pharmacy consulted to transition patient from IV heparin to apixaban.   CBC stable, no issues bleeding reported.   Goal of Therapy:  Monitor platelets by anticoagulation protocol: Yes   Plan:  Stop heparin infusion at 10AM Apixaban 10mg  x7 days followed by apixaban 5mg  thereafter Continue to monitor H&H and platelets     Of note, copay $69     Thank you for allowing pharmacy to be a part of this patient's care.  Ardyth Harps, PharmD Clinical Pharmacist

## 2022-02-23 NOTE — Inpatient Diabetes Management (Signed)
Inpatient Diabetes Program Recommendations  AACE/ADA: New Consensus Statement on Inpatient Glycemic Control (2015)  Target Ranges:  Prepandial:   less than 140 mg/dL      Peak postprandial:   less than 180 mg/dL (1-2 hours)      Critically ill patients:  140 - 180 mg/dL   Lab Results  Component Value Date   GLUCAP 127 (H) 02/23/2022   HGBA1C 12.2 (H) 02/21/2022    Review of Glycemic Control  Diabetes history: DM2 Outpatient Diabetes medications: 70/30 40 units BID, Metformin  (not taking either)  Spoke with patient at bedside.  Reviewed patient's current A1c of 12.2% (average glucose of 304 mg/dL). Explained what a A1c is and what it measures. Also reviewed goal A1c with patient, importance of good glucose control @ home, and blood sugar goals.  He has not been taking insulin for > 6 mos due to cost.  He states he lost his job and does not have insurance.  He states the pharmacist found [employer] on him and he has a co-pay of $69 for Eliquis.  Asked [employer] tech to run a benefit check on 70/30 flexpen.  70/30 requires pre-auth and 75/25 is $25 co-pay.    Discussed importance of glucose control.  Educated on The Plate Method, CHO's, portion control, CBGs at home fasting and mid afternoon, F/U with PCP every 3 months, bring meter to PCP office, long and short term complications of uncontrolled BG, and importance of exercise.  Will continue to follow while inpatient.  Thank you, Reche Dixon, MSN, [place] Diabetes Coordinator Inpatient Diabetes Program [phone] (team pager from 8a-5p)

## 2022-02-23 NOTE — Inpatient Diabetes Management (Signed)
Inpatient Diabetes Program Recommendations  AACE/ADA: New Consensus Statement on Inpatient Glycemic Control (2015)  Target Ranges:  Prepandial:   less than 140 mg/dL      Peak postprandial:   less than 180 mg/dL (1-2 hours)      Critically ill patients:  140 - 180 mg/dL   Lab Results  Component Value Date   GLUCAP 127 (H) 02/23/2022   HGBA1C 12.2 (H) 02/21/2022    Review of Glycemic Control  Latest Reference Range & Units 02/22/22 17:57 02/22/22 21:49 02/23/22 09:43  Glucose-Capillary 70 - 99 mg/dL 146 (H8 units Novolog 85 127 (H)  (H): Data is abnormally high  Current orders for Inpatient glycemic control: Levemir 20 units BID, Novolog 0-24 units TID and 6 units MCTID  Inpatient Diabetes Program Recommendations:    Please change correction to Novolog 0-20 units TID (0-24 units is TCTS order).  Will continue to follow while inpatient.  Thank you, Reche Dixon, MSN, [place] Diabetes Coordinator Inpatient Diabetes Program [phone] (team pager from 8a-5p)

## 2022-02-26 ENCOUNTER — Other Ambulatory Visit (HOSPITAL_COMMUNITY): Payer: Self-pay

## 2022-02-26 ENCOUNTER — Telehealth: Payer: Self-pay | Admitting: *Deleted

## 2022-02-26 NOTE — Progress Notes (Signed)
Call received from Nathaneil Canary, intake coordinator with [facility], stating the patient is ineligible for services due to his age. He has to be 53 years old or older in order to have services through [facility].  Alphonzo Lemmings, RN

## 2022-02-26 NOTE — Telephone Encounter (Signed)
RNCM returned call regarding PCP establishment and insurance enrollment. RNCM obtained appointment on (10/20), time (1015)  and placed on After Visit Summary paperwork.  No further case management needs communicated at this time. Jeremiah Alexander J. Clydene Laming, [place], [place], IXL

## 2022-03-09 ENCOUNTER — Ambulatory Visit (INDEPENDENT_AMBULATORY_CARE_PROVIDER_SITE_OTHER): Payer: Commercial Managed Care - HMO

## 2022-03-09 ENCOUNTER — Other Ambulatory Visit: Payer: Self-pay

## 2022-03-09 VITALS — BP 163/98 | HR 89 | Temp 98.5°F | Ht 72.0 in | Wt 289.5 lb

## 2022-03-09 DIAGNOSIS — E1142 Type 2 diabetes mellitus with diabetic polyneuropathy: Secondary | ICD-10-CM | POA: Diagnosis not present

## 2022-03-09 DIAGNOSIS — I10 Essential (primary) hypertension: Secondary | ICD-10-CM | POA: Diagnosis not present

## 2022-03-09 DIAGNOSIS — M069 Rheumatoid arthritis, unspecified: Secondary | ICD-10-CM | POA: Diagnosis not present

## 2022-03-09 DIAGNOSIS — I2699 Other pulmonary embolism without acute cor pulmonale: Secondary | ICD-10-CM

## 2022-03-09 DIAGNOSIS — Z23 Encounter for immunization: Secondary | ICD-10-CM

## 2022-03-09 DIAGNOSIS — Z139 Encounter for screening, unspecified: Secondary | ICD-10-CM

## 2022-03-09 DIAGNOSIS — Z794 Long term (current) use of insulin: Secondary | ICD-10-CM

## 2022-03-09 DIAGNOSIS — Z0000 Encounter for general adult medical examination without abnormal findings: Secondary | ICD-10-CM

## 2022-03-09 DIAGNOSIS — Z7984 Long term (current) use of oral hypoglycemic drugs: Secondary | ICD-10-CM

## 2022-03-09 DIAGNOSIS — F17210 Nicotine dependence, cigarettes, uncomplicated: Secondary | ICD-10-CM

## 2022-03-09 DIAGNOSIS — M059 Rheumatoid arthritis with rheumatoid factor, unspecified: Secondary | ICD-10-CM | POA: Insufficient documentation

## 2022-03-09 DIAGNOSIS — E119 Type 2 diabetes mellitus without complications: Secondary | ICD-10-CM

## 2022-03-09 MED ORDER — PREDNISONE 5 MG PO TABS: 5.0000 mg | ORAL_TABLET | Freq: Every day | ORAL | 1 refills | Status: DC

## 2022-03-09 MED ORDER — APIXABAN 5 MG PO TABS: 5.0000 mg | ORAL_TABLET | Freq: Two times a day (BID) | ORAL | 1 refills | Status: DC

## 2022-03-09 MED ORDER — LISINOPRIL-HYDROCHLOROTHIAZIDE 20-12.5 MG PO TABS: 1.0000 | ORAL_TABLET | Freq: Every day | ORAL | 2 refills | Status: DC

## 2022-03-09 MED ORDER — METFORMIN HCL 500 MG PO TABS: 500.0000 mg | ORAL_TABLET | Freq: Two times a day (BID) | ORAL | 2 refills | Status: DC

## 2022-03-09 NOTE — Progress Notes (Signed)
CC: New patient visit  HPI:  Mr.Braulio D Sarver is a 51 y.o. male with past medical history of RA, HTN, PE (2023), T2DM w/ neuropathy, and obesity that presents for a new patient visit.  Patient was seen [facility] in [place] previously.   Patient was recently discharged on 10/6 after being hospitalized for pulmonary embolism. Suspected to be in the setting of RA given no history of travel, malignancy, recent surgery, or recent injury. Patient was discharged on eliquis (two-48m tablets twice daily for 7 days, switch to one-565mtablet twice daily on day 8) with plan for 3-6 months of anticoagulation vs indefinite treatment. Patient denies noticing any bleeding since starting eliquis. Denies melena or hematochezia.   Patient has a history of rheumatoid arthritis. [facility] but has not taken these medications in several months. Patient is in the process of switching rheumatologists. Discharged from recent hospitalization on predniSONE 5 MG. Patient continues to have pain in bilateral hands, knees, and ankles. Patient has appointment with new rheumatologist here in [place] in 05/2022.   Patient also has a history of type 2 diabetes. Current medications include HumaLOG Mix 75/25 (25 units BID) and metformin 500 MG BID following his recent hospitalization. Patient states that he is compliant with these medications. Patient does check his blood sugar at home regularly and notes values near the low 100s. Patient denies polyuria, polydipsia, fatigue. Patient states that he has not visited the ophthalmologist for yearly eye exams.   Patient also has a history of hypertension. Current medications include lisinopril 20 MG. Patient states that he is compliant with this medication. Patient states that he does not check his BP regularly at home. Patient denies HA, lightheadedness, dizziness, CP, or SOB.     Patient denies past medical history of CHF, MI, stroke, or cancer.   Family history includes: HTN (mother, uncles), DM (uncle), CAD (uncle), stroke (grandmother), breast cancer (aunt), and throat cancer (uncle). Patient denies family history of CHF.  Past surgical history includes right foot fifth toe amputation (2015).   Past social history: Patient lives at home with his mother. Patient is not currently working due to his health conditions. Patient states that he has been smoking less than 1 PPD for the last 35 years. Patient endorses alcohol use in the past but does not currently drink. Patient endorses smoking marijuana 2X per week for several decades. Patient denies any other recreational drug use.    Allergies as of 03/09/2022   No Known Allergies      Medication List        Accurate as of March 09, 2022  8:21 AM. If you have any questions, ask your nurse or doctor.          Eliquis DVT/PE Starter Pack Generic drug: Apixaban Starter Pack (1076mnd 5mg3make as directed on package: start with two-5mg 72mlets twice daily for 7 days. On day 8, switch to one-5mg t12met twice daily. **Please ensure you follow up with your PCP outpatient to get refills!**   HumaLOG Mix 75/25 KwikPen (75-25) 100 UNIT/ML Kwikpen Generic drug: Insulin Lispro Prot & Lispro Inject 25 Units into the skin 2 (two) times daily with a meal.   lisinopril 20 MG tablet Commonly known as: ZESTRIL Take 1 tablet (20 mg total) by mouth daily.   lisinopril 20 MG tablet Commonly known as: ZESTRIL Take 1 tablet (20 mg total) by mouth daily.  metFORMIN 500 MG tablet Commonly known as: GLUCOPHAGE Take 1 tablet (500 mg total) by mouth 2 (two) times daily with a meal.   OneTouch Delica Plus [id number] Misc use as directed to check blood sugars up to 4 times daily   OneTouch Verio Flex System w/Device Kit Use up to four times daily as directed.   OneTouch Verio test strip Generic drug: glucose  blood use as directed to check blood sugars up to 4 times daily   predniSONE 5 MG tablet Commonly known as: DELTASONE Take 1 tablet (5 mg total) by mouth daily with breakfast for 14 days.   TechLite Pen Needles 31G X 8 MM Misc Generic drug: Insulin Pen Needle Use as needed to inject insulin.         Past Medical History:  Diagnosis Date   Diabetes mellitus without complication ([place])    Hypertension    Review of Systems:  per HPI.   Physical Exam: Vitals:   03/09/22 1009  BP: (!) 163/98  Pulse: 89  Temp: 98.5 F (36.9 C)  TempSrc: Oral  SpO2: 100%  Weight: 289 lb 8 oz (131.3 kg)  Height: 6' (1.829 m)   Constitutional: appears uncomfortable  Cardiovascular: Regular rate, regular rhythm. No murmurs, rubs, or gallops. Normal radial and PT pulses bilaterally. Trace LE edema bilaterally.   Pulmonary: Normal respiratory effort. No wheezes, rales, or rhonchi.   Abdominal: Soft. Non-distended. No tenderness. Normal bowel sounds.  Musculoskeletal: Normal range of motion. Tenderness to palpation of bilateral knees diffusely with mild swelling. Tenderness to palpation of bilateral ankles diffusely with mild swelling.  Neurological: Alert and oriented to person, place, and time. Non-focal. Skin: warm and dry.    Assessment & Plan:   See Encounters Tab for problem based charting.  Patient seen with Dr. Saverio Danker.

## 2022-03-09 NOTE — Assessment & Plan Note (Signed)
Patient was previously seen by Dr. Ephriam Jenkins at [facility]. Was on methotrexate and etanercept previously but has not taken these medications in several months. Patient is in the process of switching rheumatologists. Discharged from recent hospitalization on predniSONE 5 MG. Patient continues to have pain in bilateral hands, knees, and ankles that is limiting his functioning. On exam, patient has tenderness to palpation of bilateral knees and ankles diffusely with mild swelling. Instructed patient to visit his old rheumatologist at his previously scheduled appointment on 11/3. Patient has appointment with new rheumatologist here in [place] in 05/2022.   Plan: - Continue predniSONE 5 MG (taper 20 mg 5 days, 15 mg 5 days, 10 mg 5 days, 5 mg from there on) - Counseled on importance of checking blood sugar regularly with increased steroid dose given his history of T2DM (instructed to call our clinic and visit the ED if he becomes symptomatic) - F/u with rheumatology

## 2022-03-09 NOTE — Assessment & Plan Note (Signed)
TDAP given today. Hep C screening performed today. Patient referred for colonoscopy for screening.

## 2022-03-09 NOTE — Assessment & Plan Note (Signed)
Current medications include HumaLOG Mix 75/25 (25 units BID) and metformin 500 MG BID following his recent hospitalization. Patient states that he is compliant with these medications. Patient does check his blood sugar at home regularly and notes values near the low 100s. Patient denies polyuria, polydipsia, fatigue. Patient states that he has not visited the ophthalmologist for yearly eye exams. A1c was 12.2 in 02/2022. Foot exam performed today. Patient would benefit from starting SGLT2i and GLP1 agonist at subsequent visits.   Plan: - Urine ACR today - Continue HumaLOG Mix 75/25 (25 units BID) - Titrate up metformin 500 MG BID (2 pills in the morning and 1 pill in the evening for 7 days followed by 2 pills in the morning and 2 pills in the evening from there on) - Referral to ophthalmology ordered for yearly eye exams

## 2022-03-09 NOTE — Patient Instructions (Addendum)
Thank you for coming to see [place] in clinic Mr. Leveque.   Plan: - Please continue taking eliquis 5 mg twice daily due to your recent blood clots in your lungs  - We will refer you to have a colonoscopy done for colon cancer screening (you will be called about this)  - Please continue taking prednisone with the following schedule: Take 20 mg total (4 pills) for 5 days Followed by: Take 15 mg total (3 pills) for 5 days Followed by: Take 10 mg total (2 pills) for 5 days Followed by: Take 5 mg total (1 pill) from there on  - Please measure your blood glucose more frequently as increases in steroid dose can increase your blood sugar (please call our clinic of you start feeling bad or have increased thirst, urinating more frequently, or becoming increasingly tired)  - Please follow up with Rheumatology at your scheduled appointments: 03/23/2022  9:00 AM Defoor, Starr Sinclair, [facility] [address] [place] [address]   Phone: [phone] Fax: [phone]   Shirlyn Goltz, MD Rheumatology [address] [place] [address]   Phone: [phone] Fax: [phone] 05/31/2022 8:00 AM Rice, Resa Miner, MD [facility] [place] Address: [address], [place], [place] [address] Phone: [phone]  - Please continue using humalog 75/25 (25 units in the morning and 25 units in the evening) for your diabetes  - Please increase your metformin dose as follows: Metformin 500 mg (2 pills in the morning and 1 pill in the evening for 7 days) Followed by: Metformin 500 mg (2 pills in the morning and 2 pills in the evening from there on)  - We got your urine today to check your kidney function (you will be called about these results)  - We will refer you to an eye doctor for yearly eye exams (you will be called about this)  - Please stop taking lisinopril 20 mg for your high blood pressure  -  Please start taking lisinopril-hydrochlorothiazide 20-12.5 mg for your high blood pressure  - We drew your blood today to check your kidney function (we will call you with these results)   It was very nice to meet you.

## 2022-03-09 NOTE — Assessment & Plan Note (Signed)
Current medications include lisinopril 20 MG. Patient states that he is compliant with this medication. Patient states that he does not check his BP regularly at home. Patient denies HA, lightheadedness, dizziness, CP, or SOB. Initial BP today is 163/98. Repeat BP improved but remains elevated.    Plan: - Discontinue lisinopril 20 MG - Start Lisinopril-HCTZ 20-12.5 mg - BMP today to check renal function - BMP to check renal function at next appointment in 4 weeks.

## 2022-03-09 NOTE — Assessment & Plan Note (Signed)
Patient was recently discharged on 10/6 after being hospitalized for pulmonary embolism. CT demonstrated bilateral lobar and segmental PE w/ evidence of right heart strain. Echo w/ EF 50-55% w/ grade 1 diastolic dysfunction. No DVT on LE [place]. Suspected to be in the setting of RA given no history of travel, malignancy, recent surgery, or recent injury. Patient was discharged on eliquis (two-5mg  tablets twice daily for 7 days, switch to one-5mg  tablet twice daily on day 8) with plan for 3-6 months of anticoagulation vs indefinite treatment. Patient denies noticing any bleeding since starting eliquis. Denies melena or hematochezia. Given that PE was likely unprovoked vs secondary to rheumatoid arthritis, patient will likely require indefinite anticoagulation.    Plan: - Continue eliquis 5 mg BID - Routine cancer screening (referral for colonoscopy ordered)

## 2022-03-10 LAB — BMP8+ANION GAP
Anion Gap: 16 mmol/L (ref 10.0–18.0)
BUN/Creatinine Ratio: 12 (ref 9–20)
BUN: 10 mg/dL (ref 6–24)
CO2: 23 mmol/L (ref 20–29)
Calcium: 9.7 mg/dL (ref 8.7–10.2)
Chloride: 100 mmol/L (ref 96–106)
Creatinine, Ser: 0.85 mg/dL (ref 0.76–1.27)
Glucose: 60 mg/dL — ABNORMAL LOW (ref 70–99)
Potassium: 4.3 mmol/L (ref 3.5–5.2)
Sodium: 139 mmol/L (ref 134–144)
eGFR: 104 mL/min/{1.73_m2} (ref >59)

## 2022-03-10 LAB — MICROALBUMIN / CREATININE URINE RATIO
Creatinine, Urine: 227.7 mg/dL
Microalb/Creat Ratio: 6 mg/g creat (ref 0–29)
Microalbumin, Urine: 14.6 ug/mL

## 2022-03-10 LAB — HCV INTERPRETATION

## 2022-03-10 LAB — HCV AB W REFLEX TO QUANT PCR: HCV Ab: NONREACTIVE

## 2022-03-12 ENCOUNTER — Telehealth: Payer: Self-pay

## 2022-03-12 NOTE — Telephone Encounter (Signed)
Decision:Approved. Curlene Labrum Key: [id number] - PA Case ID: [id number] - Rx #: [id number] Need help? Call us at [phone] Outcome Approvedtoday CaseId:[id number];Status:Approved;Review Type:Prior Auth;Coverage Start Date:03/12/2022;Coverage End Date:03/12/2023; Drug Eliquis 5MG  tablets Form [employer] Electronic PA Form (2017 NCPDP) Original Claim Info 73

## 2022-03-12 NOTE — Progress Notes (Signed)
Called patient and updated on results.

## 2022-03-12 NOTE — Telephone Encounter (Signed)
Prior Authorization for patient (Eliquis) came through on cover my meds was submitted with last office notes awaiting approval or denial

## 2022-03-12 NOTE — Progress Notes (Signed)
Called patient and updated on results. Discussed his low blood glucose during our visit. Patient states that he did not eat a big enough meal with his insulin prior to our visit. Patient states that he is measuring his blood glucose regularly at home and is not noticing values below 80. Discussed the importance of eating a normal sized meal with his insulin as well as measuring his blood glucose regularly. Discussed importance of calling our clinic and visiting the ED if he develops shaking, diaphoresis, dizziness, confusion, or anxiety as these could be signs of low blood sugar.

## 2022-03-14 NOTE — Addendum Note (Signed)
Addended by: Charise Killian on: 03/14/2022 10:40 AM   Modules accepted: Level of Service

## 2022-03-14 NOTE — Progress Notes (Signed)
Internal Medicine Clinic Attending  I saw and evaluated the patient.  I personally confirmed the key portions of the history and exam documented by Dr. Mapp and I reviewed pertinent patient test results.  The assessment, diagnosis, and plan were formulated together and I agree with the documentation in the resident's note.  

## 2022-05-29 ENCOUNTER — Other Ambulatory Visit: Payer: Self-pay

## 2022-05-30 NOTE — Progress Notes (Unsigned)
Office Visit Note  Patient: Jeremiah Alexander             Date of Birth: 11-03-1970           MRN: [id number]             PCP: Starlyn Skeans, MD Referring: Elodia Florence.,* Visit Date: 05/31/2022 Occupation: @GUAROCC @  Subjective:  No chief complaint on file.   History of Present Illness: Jeremiah Alexander is a 52 y.o. male here for seropositive rheumatoid arthritis.***     Outside note reciewed :  Jeremiah Alexander is a 52 y.o.male who presents today for follow-up evaluation of active moderate Stage 2 rheumatoid arthritis with positive rheumatoid factor. He was last seen 12/21/2021 and was doing poorly, having not yet started Enbrel, but on methotrexate and plaquenil. We issued a prednisone taper, advised him Enbrel just needed him to schedule delivery.  He last got hand x-rays completed 2023.   Jeremiah Alexander was diagnosed with rheumatoid arthritis with positive rheumatoid factor in 2023 by Will DeFoor, PA-C. Diagnosis was supported by RF 187 and anti-CCP 39.  Jeremiah Alexander has tried the following rheumatologic medications in the past: - Prednisone - helped - Methotrexate - current - Plaquenil - current - Enbrel - did not get filled due to change of insurance - Unable to take JAK inhibitors due to history of pulmonary embolism  He is very poorly today, having gotten a PE since his last visit. His plaquenil and methotrexate prescriptions ran out and he did not get them refilled stating he thought it was a short-term medication. He is experiencing pain in the hands, wrists, shoulders, knees, ankles, and feet. Activity relieves the pain. Rest makes the pain worse. He has morning stiffness lasting more than 30 minutes but he is unsure how long exactly because he gets tired very quickly when up and moving.   Since last visit he had a PE that they suspect was provoked by RA, and he is now on eliquis and they plan to keep him on anticoagulation lifelong.   Mr.  Jeremiah Alexander is currently being treated with prednisone taper from hospital. He has not experienced side effects from the medication. He has not had any infections since last office visit.   He has not had any other significant health changes or unexplained new symptoms since last visit.   Jeremiah Alexander work status is currently unemployed due to his symptoms. He is a current smoker who smokes <0.25 ppd for 20 years, for a total of <5 pack years. He drinks an average of 0 servings of alcohol weekly. Jeremiah Alexander reports a family history of RA in his mother but denies any family history of known autoimmune disease otherwise.  ***    Review of Systems ROS was negative except as noted above.    Activities of Daily Living:  Patient reports morning stiffness for *** {minute/hour:19697}.   Patient {ACTIONS;DENIES/REPORTS:21021675::"Denies"} nocturnal pain.  Difficulty dressing/grooming: {ACTIONS;DENIES/REPORTS:21021675::"Denies"} Difficulty climbing stairs: {ACTIONS;DENIES/REPORTS:21021675::"Denies"} Difficulty getting out of chair: {ACTIONS;DENIES/REPORTS:21021675::"Denies"} Difficulty using hands for taps, buttons, cutlery, and/or writing: {ACTIONS;DENIES/REPORTS:21021675::"Denies"}  No Rheumatology ROS completed.   PMFS History:  Patient Active Problem List   Diagnosis Date Noted   Rheumatoid arthritis ([place]) 03/09/2022   Preventative health care 03/09/2022   Pulmonary embolism without acute cor pulmonale ([place]) 02/22/2022   Pulmonary embolism ([place]) 02/22/2022   TOBACCO ABUSE 06/05/2010   Diabetes ([place]) 05/08/2010   OBESITY 05/08/2010   HYPERTENSION, BENIGN ESSENTIAL 05/08/2010    Past  Medical History:  Diagnosis Date   Diabetes mellitus without complication ([place])    Hypertension     Family History  Problem Relation Age of Onset   Diabetes Other    Past Surgical History:  Procedure Laterality Date   TOE AMPUTATION     Right Pinky Toe   Social History   Social History Narrative   Not on  file   Immunization History  Administered Date(s) Administered   Influenza Whole 04/12/2010   Tdap 03/09/2022     Objective: Vital Signs: There were no vitals taken for this visit.   Physical Exam   Musculoskeletal Exam: ***  CDAI Exam: CDAI Score: -- Patient Global: --; Provider Global: -- Swollen: --; Tender: -- Joint Exam 05/31/2022   No joint exam has been documented for this visit   There is currently no information documented on the homunculus. Go to the Rheumatology activity and complete the homunculus joint exam.  Investigation: No additional findings.  Imaging: No results found.  Recent Labs: Lab Results  Component Value Date   WBC 8.6 02/21/2022   HGB 14.1 02/21/2022   PLT 362 02/21/2022   NA 139 03/09/2022   K 4.3 03/09/2022   CL 100 03/09/2022   CO2 23 03/09/2022   GLUCOSE 60 (L) 03/09/2022   BUN 10 03/09/2022   CREATININE 0.85 03/09/2022   BILITOT 0.4 07/21/2012   ALKPHOS 89 07/21/2012   AST 23 07/21/2012   ALT 21 07/21/2012   PROT 7.7 07/21/2012   ALBUMIN 3.6 04/22/2014   CALCIUM 9.7 03/09/2022   GFRAA >60 10/12/2019    Speciality Comments: No specialty comments available.  Procedures:  No procedures performed Allergies: Patient has no known allergies.   Assessment / Plan:     Visit Diagnoses: No diagnosis found.  Orders: No orders of the defined types were placed in this encounter.  No orders of the defined types were placed in this encounter.   Face-to-face time spent with patient was *** minutes. Greater than 50% of time was spent in counseling and coordination of care.  Follow-Up Instructions: No follow-ups on file.   Collier Salina, MD  Note - This record has been created using [employer].  Chart creation errors have been sought, but may not always  have been located. Such creation errors do not reflect on  the standard of medical care.

## 2022-05-31 ENCOUNTER — Ambulatory Visit: Payer: Commercial Managed Care - HMO

## 2022-05-31 ENCOUNTER — Other Ambulatory Visit (HOSPITAL_COMMUNITY): Payer: Self-pay

## 2022-05-31 ENCOUNTER — Ambulatory Visit (INDEPENDENT_AMBULATORY_CARE_PROVIDER_SITE_OTHER): Payer: Commercial Managed Care - HMO

## 2022-05-31 ENCOUNTER — Encounter: Payer: Self-pay | Admitting: Internal Medicine

## 2022-05-31 ENCOUNTER — Other Ambulatory Visit: Payer: Self-pay

## 2022-05-31 ENCOUNTER — Telehealth: Payer: Self-pay | Admitting: Pharmacist

## 2022-05-31 ENCOUNTER — Ambulatory Visit: Payer: Commercial Managed Care - HMO | Admitting: Pharmacist

## 2022-05-31 ENCOUNTER — Ambulatory Visit: Payer: Commercial Managed Care - HMO | Attending: Internal Medicine | Admitting: Internal Medicine

## 2022-05-31 VITALS — BP 163/108 | HR 73 | Resp 15 | Ht 71.0 in | Wt 280.0 lb

## 2022-05-31 DIAGNOSIS — J069 Acute upper respiratory infection, unspecified: Secondary | ICD-10-CM | POA: Insufficient documentation

## 2022-05-31 DIAGNOSIS — M25561 Pain in right knee: Secondary | ICD-10-CM

## 2022-05-31 DIAGNOSIS — Z79899 Other long term (current) drug therapy: Secondary | ICD-10-CM | POA: Insufficient documentation

## 2022-05-31 DIAGNOSIS — M79672 Pain in left foot: Secondary | ICD-10-CM

## 2022-05-31 DIAGNOSIS — M25562 Pain in left knee: Secondary | ICD-10-CM

## 2022-05-31 DIAGNOSIS — G8929 Other chronic pain: Secondary | ICD-10-CM

## 2022-05-31 DIAGNOSIS — M79671 Pain in right foot: Secondary | ICD-10-CM

## 2022-05-31 DIAGNOSIS — E785 Hyperlipidemia, unspecified: Secondary | ICD-10-CM | POA: Insufficient documentation

## 2022-05-31 DIAGNOSIS — M059 Rheumatoid arthritis with rheumatoid factor, unspecified: Secondary | ICD-10-CM | POA: Diagnosis not present

## 2022-05-31 DIAGNOSIS — F17200 Nicotine dependence, unspecified, uncomplicated: Secondary | ICD-10-CM | POA: Diagnosis not present

## 2022-05-31 DIAGNOSIS — I10 Essential (primary) hypertension: Secondary | ICD-10-CM | POA: Insufficient documentation

## 2022-05-31 MED ORDER — PREDNISONE 5 MG PO TABS: 5.0000 mg | ORAL_TABLET | Freq: Every day | ORAL | 2 refills | Status: DC

## 2022-05-31 MED ORDER — HYDROXYCHLOROQUINE SULFATE 200 MG PO TABS: 400.0000 mg | ORAL_TABLET | Freq: Every day | ORAL | 2 refills | Status: DC

## 2022-05-31 MED ORDER — ENBREL MINI 50 MG/ML ~~LOC~~ SOCT
50.0000 mg | SUBCUTANEOUS | 2 refills | Status: DC
  Filled 2022-05-31: qty 4, fill #0
  Filled 2022-05-31: qty 4, 28d supply, fill #0
  Filled 2022-06-25 – 2022-07-27 (×3): qty 4, 28d supply, fill #1
  Filled 2022-08-15: qty 4, 28d supply, fill #2

## 2022-05-31 NOTE — Patient Instructions (Addendum)
Your next ENBREL dose is due on 06/07/22, 06/14/22, and every 7 days thereafter  CONTINUE hydroxychloroquine  2 tabs daily  HOLD ENBREL if you have signs or symptoms of an infection. You can resume once you feel better or back to your baseline. HOLD ENBREL if you start antibiotics to treat an infection. HOLD ENBREL around the time of surgery/procedures. Your surgeon will be able to provide recommendations on when to hold BEFORE and when you are cleared to [place].  Pharmacy information: Your prescription will be shipped from [facility]. Their phone number is [phone] [facility] will clal you to schedule the first shipment Please call to schedule shipment and confirm address. They will mail your medication to your home.  Labs are due in 1 month then every 3 months. Lab hours are from Monday to Thursday 8am-12:30pm and 1pm-5pm and Friday 8am-12pm. You do not need an appointment if you come for labs during these times.  How to manage an injection site reaction: Remember the 5 C's: COUNTER - leave on the counter at least 30 minutes but up to overnight to bring medication to room temperature. This may help prevent stinging COLD - place something cold (like an ice gel pack or cold water bottle) on the injection site just before cleansing with alcohol. This may help reduce pain CLARITIN - use Claritin (generic name is loratadine) for the first two weeks of treatment or the day of, the day before, and the day after injecting. This will help to minimize injection site reactions CORTISONE CREAM - apply if injection site is irritated and itching CALL ME - if injection site reaction is bigger than the size of your fist, looks infected, blisters, or if you develop hives

## 2022-05-31 NOTE — Progress Notes (Signed)
Pharmacy Note  Subjective: Patient presents today to the [facility] for follow up office visit.  Patient seen by the pharmacist for counseling on Enbrel for rheumatoid arthritis.  Diagnosis of heart failure: No  Objective:  CBC    Component Value Date/Time   WBC 8.6 02/21/2022 1825   RBC 4.78 02/21/2022 1825   HGB 14.1 02/21/2022 1825   HCT 42.6 02/21/2022 1825   PLT 362 02/21/2022 1825   MCV 89.1 02/21/2022 1825   MCH 29.5 02/21/2022 1825   MCHC 33.1 02/21/2022 1825   RDW 13.6 02/21/2022 1825   LYMPHSABS 1.4 02/21/2022 1825   MONOABS 0.8 02/21/2022 1825   EOSABS 0.2 02/21/2022 1825   BASOSABS 0.0 02/21/2022 1825     CMP     Component Value Date/Time   NA 139 03/09/2022 1207   NA 134 (L) 04/22/2014 0840   K 4.3 03/09/2022 1207   K 4.1 04/22/2014 0840   CL 100 03/09/2022 1207   CL 102 04/22/2014 0840   CO2 23 03/09/2022 1207   CO2 29 04/22/2014 0840   GLUCOSE 60 (L) 03/09/2022 1207   GLUCOSE 365 (H) 02/21/2022 1950   GLUCOSE 202 (H) 04/22/2014 0840   BUN 10 03/09/2022 1207   BUN 19 (H) 04/22/2014 0840   CREATININE 0.85 03/09/2022 1207   CREATININE 1.18 04/22/2014 0840   CALCIUM 9.7 03/09/2022 1207   CALCIUM 8.8 04/22/2014 0840   PROT 7.7 07/21/2012 1322   ALBUMIN 3.6 04/22/2014 0840   AST 23 07/21/2012 1322   ALT 21 07/21/2012 1322   ALKPHOS 89 07/21/2012 1322   BILITOT 0.4 07/21/2012 1322   GFRNONAA >60 02/21/2022 1950   GFRNONAA >60 04/22/2014 0840   GFRAA >60 10/12/2019 1446   GFRAA >60 04/22/2014 0840     Baseline Immunosuppressant Therapy Labs TB GOLD   Hepatitis Panel   HIV Lab Results  Component Value Date   HIV Non Reactive 02/22/2022   Immunoglobulins   SPEP    Latest Ref Rng & Units 07/21/2012    1:22 PM  Serum Protein Electrophoresis  Total Protein 6.0 - 8.3 g/dL 7.7    G6PD No results found for: "G6PDH" TPMT No results found for: "TPMT"   Chest x-ray: 02/21/2022 - 1. Bibasilar atelectasis. 2. Blunting of the  right costophrenic angle may be due to pleural thickening or small effusion.    Assessment/Plan:  Counseled patient that Enbrel is a TNF blocking agent.  Counseled patient on purpose, proper use, and adverse effects of Enbrel.  Reviewed the most common adverse effects including infections, headache, and injection site reactions.  Discussed that there is the possibility of an increased risk of malignancy including non-melanoma skin cancer but it is not well understood if this increased risk is due to the medication or the disease state.  Advised patient to get yearly dermatology exams due to risk of skin cancer.  Counseled patient that Enbrel should be held prior to scheduled surgery.  Counseled patient to avoid live vaccines while on Enbrel.  Recommend annual influenza, PCV 15 or PCV20 or Pneumovax 23, and Shingrix as indicated.  Reviewed the importance of regular labs while on Enbrel therapy.  Will monitor CBC and CMP 1 month after starting and then every 3 months routinely thereafter. Will monitor TB gold annually. Standing orders placed. Provided patient with medication education material and answered all questions.  Patient consented to Enbrel.  Will upload consent into the media tab.  Reviewed storage instructions for Enbrel.  Advised initial  injection must be administered in office.  Since patient is already approved for E  Dermatology referral was placed today.   Knox Saliva, PharmD, MPH, BCPS, CPP Clinical Pharmacist (Rheumatology and Pulmonology)

## 2022-05-31 NOTE — Patient Instructions (Signed)
Etanercept Injection What is this medication? ETANERCEPT (et a NER sept) treats autoimmune conditions, such as psoriasis and certain types of arthritis. It works by slowing down an overactive immune system. It belongs to a group of medications called TNF inhibitors. This medicine may be used for other purposes; ask your health care provider or pharmacist if you have questions. COMMON BRAND NAME(S): Enbrel What should I tell my care team before I take this medication? They need to know if you have any of these conditions: Bleeding disorder Cancer Diabetes Granulomatosis with polyangiitis Heart failure HIV or AIDS Immune system problems Infection, such as tuberculosis (TB) or other bacterial, fungal or viral infections Liver disease Nervous system problems, such as Guillain-Barre syndrome, multiple sclerosis or seizures Recent or upcoming vaccine An unusual or allergic reaction to etanercept, other medications, food, dyes, or preservatives Pregnant or trying to get pregnant Breastfeeding How should I use this medication? The medication is injected under the skin. You will be taught how to prepare and give it. Take it as directed on the prescription label. Keep taking it unless your care team tells you stop. This medication comes with INSTRUCTIONS FOR USE. Ask your pharmacist for directions on how to use this medication. Read the information carefully. Talk to your pharmacist or care team if you have questions. If you use a pen, be sure to take off the outer needle cover before using the dose. It is important that you put your used needles and syringes in a special sharps container. Do not put them in a trash can. If you do not have a sharps container, call your pharmacist or care team to get one. A special MedGuide will be given to you by the pharmacist with each prescription and refill. Be sure to read this information carefully each time. Talk to your care team about the use of this  medication in children. While it may be prescribed for children as young as 2 years of age for selected conditions, precautions do apply. Overdosage: If you think you have taken too much of this medicine contact a poison control center or emergency room at once. NOTE: This medicine is only for you. Do not share this medicine with others. What if I miss a dose? If you miss a dose, take it as soon as you can. If it is almost time for your next dose, take only that dose. Do not take double or extra doses. What may interact with this medication? Do not take this medication with any of the following: Biologic medications, such as adalimumab, certolizumab, golimumab, infliximab Live vaccines Rilonacept This medication may also interact with the following: Abatacept [place] Biologic medications, such as anifrolumab, baricitinib, belimumab, canakinumab, natalizumab, rituximab, sarilumab, tocilizumab, tofacitinib, upadacitinib, vedolizumab Cyclophosphamide Sulfasalazine This list may not describe all possible interactions. Give your health care provider a list of all the medicines, herbs, non-prescription drugs, or dietary supplements you use. Also tell them if you smoke, drink alcohol, or use illegal drugs. Some items may interact with your medicine. What should I watch for while using this medication? Visit your care team for regular checks on your progress. Tell your care team if your symptoms do not start to get better or if they get worse. This medication may increase your risk of getting an infection. Call your care team for advice if you get a fever, chills, sore throat, or other symptoms of a cold or flu. Do not treat yourself. Try to avoid being around people who are sick. If   you have not had the measles or chickenpox vaccines, tell your care team right away if you are around someone with these viruses. You will be tested for tuberculosis (TB) before you start this medication. If your care team  prescribes any medication for TB, you should start taking the TB medication before starting this medication. Make sure to finish the full course of TB medication. Avoid taking medications that contain aspirin, acetaminophen, ibuprofen, naproxen, or ketoprofen unless instructed by your care team. These medications may hide fever. Talk to your care team about your risk of cancer. You may be more at risk for certain types of cancer if you take this medication. This medication can decrease the response to a vaccine. If you need to get vaccinated, tell your care team if you have received this medication. Extra booster doses may be needed. Talk to your care team to see if a different vaccination schedule is needed. What side effects may I notice from receiving this medication? Side effects that you should report to your care team as soon as possible: Allergic reactions--skin rash, itching, hives, swelling of the face, lips, tongue, or throat Body pain, tingling, or numbness Eye pain, change in vision, vision loss Heart failure--shortness of breath, swelling of the ankles, feet, or hands, sudden weight gain, unusual weakness or fatigue Infection--fever, chills, cough, sore throat, wounds that don't heal, pain or trouble when passing urine, general feeling of discomfort or being unwell Liver injury--right upper belly pain, loss of appetite, nausea, light-colored stool, dark yellow or brown urine, yellowing skin or eyes, unusual weakness or fatigue Low red blood cell level--unusual weakness or fatigue, dizziness, headache, trouble breathing Lupus-like syndrome--joint pain, swelling, or stiffness, butterfly-shaped rash on the face, rashes that get worse in the sun, fever, unusual weakness or fatigue New or worsening psoriasis--rash with itchy, scaly patches Seizures Unusual bruising or bleeding Weakness in arms and legs Side effects that usually do not require medical attention (report to your care team if  they continue or are bothersome): Headache Pain, redness, or irritation at injection site Sinus pain or pressure around the face or forehead This list may not describe all possible side effects. Call your doctor for medical advice about side effects. You may report side effects to FDA at 1-800-FDA-1088. Where should I keep my medication? Keep out of the reach of children and pets. See product for storage information. Each product may have different instructions. Get rid of any unused medication after the expiration date. To get rid of medications that are no longer needed or have expired: Take the medication to a medication take-back program. Check with your pharmacy or law enforcement to find a location. If you cannot return the medication, ask your pharmacist or care team how to get rid of this medication safely. NOTE: This sheet is a summary. It may not cover all possible information. If you have questions about this medicine, talk to your doctor, pharmacist, or health care provider.  2023 Elsevier/Gold Standard (2021-11-01 00:00:00)  

## 2022-05-31 NOTE — Progress Notes (Signed)
Pharmacy Note  Subjective:   Patient presents to clinic to establish care with Dr. Benjamine Mola. He already had Enbrel PA approved via [facility]. We decided to initiate with first dose of Enbrel today and patient is in agreement. Patient currently takes hydroxychloroquine 400mg  daily.  Patient running a fever or have signs/symptoms of infection? No  Patient currently on antibiotics for the treatment of infection? No  Patient have any upcoming invasive procedures/surgeries? No  Objective: CMP     Component Value Date/Time   NA 139 03/09/2022 1207   NA 134 (L) 04/22/2014 0840   K 4.3 03/09/2022 1207   K 4.1 04/22/2014 0840   CL 100 03/09/2022 1207   CL 102 04/22/2014 0840   CO2 23 03/09/2022 1207   CO2 29 04/22/2014 0840   GLUCOSE 60 (L) 03/09/2022 1207   GLUCOSE 365 (H) 02/21/2022 1950   GLUCOSE 202 (H) 04/22/2014 0840   BUN 10 03/09/2022 1207   BUN 19 (H) 04/22/2014 0840   CREATININE 0.85 03/09/2022 1207   CREATININE 1.18 04/22/2014 0840   CALCIUM 9.7 03/09/2022 1207   CALCIUM 8.8 04/22/2014 0840   PROT 7.7 07/21/2012 1322   ALBUMIN 3.6 04/22/2014 0840   AST 23 07/21/2012 1322   ALT 21 07/21/2012 1322   ALKPHOS 89 07/21/2012 1322   BILITOT 0.4 07/21/2012 1322   GFRNONAA >60 02/21/2022 1950   GFRNONAA >60 04/22/2014 0840   GFRAA >60 10/12/2019 1446   GFRAA >60 04/22/2014 0840    CBC    Component Value Date/Time   WBC 8.6 02/21/2022 1825   RBC 4.78 02/21/2022 1825   HGB 14.1 02/21/2022 1825   HCT 42.6 02/21/2022 1825   PLT 362 02/21/2022 1825   MCV 89.1 02/21/2022 1825   MCH 29.5 02/21/2022 1825   MCHC 33.1 02/21/2022 1825   RDW 13.6 02/21/2022 1825   LYMPHSABS 1.4 02/21/2022 1825   MONOABS 0.8 02/21/2022 1825   EOSABS 0.2 02/21/2022 1825   BASOSABS 0.0 02/21/2022 1825    Baseline Immunosuppressant Therapy Labs TB GOLD negative on 08/03/2021   Hepatitis Panel Hepatitis C Ab - non reactive on 03/09/2022 08/03/2021  Hepatitis B surface antigen - reactive  (consistent with immunity) Hepatitis B core total antibody - negative HCV - non-reactive   HIV Lab Results  Component Value Date   HIV Non Reactive 02/22/2022   Immunoglobulins   SPEP    Latest Ref Rng & Units 07/21/2012    1:22 PM  Serum Protein Electrophoresis  Total Protein 6.0 - 8.3 g/dL 7.7    Chest x-ray: 02/21/2022 - 1. Bibasilar atelectasis. 2. Blunting of the right costophrenic angle may be due to pleural thickening or small effusion.  Assessment/Plan:  Patient has all baseline labs in place to proceed with Enbrel today. Reviewed importance of holding ENBREL with signs/symptoms of an infections, if antibiotics are prescribed to treat an active infection, and with invasive procedures  Demonstrated proper injection technique with ENBREL MINI AND INJECTOR DEVICE demo device  Patient able to demonstrate proper injection technique using the teach back method.  Patient self injected in the right lower abdomen with:  Sample Medication: Enbrel Mini 50 mg/ml cartridge NDC: [address]-76 Lot: [id number] Expiration: 01/19/2024  Sample Medication: Enbrel Autotouch Injector Device NDC: [address]-17 Lot: [id number] Expiration: 02/17/2025  Patient tolerated well.  Observed for 30 mins in office for adverse reaction and none noted. Patient denies itchiness and irritation   Patient is to return in 1 month for labs and 6-8 weeks for follow-up appointment.  Standing orders placed.  Referral to [facility] placed today for yearly skin checks while on TNF inhibitor due to risk for non-melanoma skin cancer.  ENBREL approved through insurance .   Rx sent to: [facility]: [phone] .  Patient provided with pharmacy phone number and advised that [facility] will call to schedule first shipment to home.  Patient will continue Enbrel 50mg  SQ every 7 days in combination with hydroxychloroquine 400mg  daily  All questions encouraged and answered.  Instructed  patient to call with any further questions or concerns.  Knox Saliva, PharmD, MPH, BCPS, CPP Clinical Pharmacist (Rheumatology and Pulmonology)  05/31/2022 10:35 AM

## 2022-05-31 NOTE — Telephone Encounter (Signed)
Patient new start to Enbrel. Enbrel was already authorized through [facility].  Patient states his Medicaid just became active 2 weeks ago but also he is paying for [place] plan through marketplace. He was unaware that benefits are not coordinated. I was able to run a test claim through both commercial [employer] plan and Medicaid plan. He states that knowing this, he will try to coordinate benefits and/or consider removing [employer] plan altogether. I did advise that this may affect his overall billing at clinics and procedures etc. He will think more on it.  Through [employer] plan, PA for Enbrel approved from 05/15/22 through 05/15/23. PA # [id number]   Knox Saliva, PharmD, MPH, BCPS, CPP Clinical Pharmacist (Rheumatology and Pulmonology)

## 2022-06-01 ENCOUNTER — Other Ambulatory Visit (HOSPITAL_COMMUNITY): Payer: Self-pay

## 2022-06-01 ENCOUNTER — Other Ambulatory Visit: Payer: Self-pay

## 2022-06-01 NOTE — Progress Notes (Signed)
Lab results look fine for continuing the current medications as planned.

## 2022-06-02 LAB — CBC WITH DIFFERENTIAL/PLATELET
Absolute Monocytes: 511 cells/uL (ref 200–950)
Basophils Absolute: 50 cells/uL (ref 0–200)
Basophils Relative: 0.7 %
Eosinophils Absolute: 187 cells/uL (ref 15–500)
Eosinophils Relative: 2.6 %
HCT: 41.8 % (ref 38.5–50.0)
Hemoglobin: 13.6 g/dL (ref 13.2–17.1)
Lymphs Abs: 1656 cells/uL (ref 850–3900)
MCH: 29.1 pg (ref 27.0–33.0)
MCHC: 32.5 g/dL (ref 32.0–36.0)
MCV: 89.3 fL (ref 80.0–100.0)
MPV: 9 fL (ref 7.5–12.5)
Monocytes Relative: 7.1 %
Neutro Abs: 4795 cells/uL (ref 1500–7800)
Neutrophils Relative %: 66.6 %
Platelets: 341 10*3/uL (ref 140–400)
RBC: 4.68 10*6/uL (ref 4.20–5.80)
RDW: 13.6 % (ref 11.0–15.0)
Total Lymphocyte: 23 %
WBC: 7.2 10*3/uL (ref 3.8–10.8)

## 2022-06-02 LAB — QUANTIFERON-TB GOLD PLUS
Mitogen-NIL: >10 IU/mL
NIL: 0.04 IU/mL
QuantiFERON-TB Gold Plus: NEGATIVE
TB1-NIL: <0 IU/mL
TB2-NIL: 0 IU/mL

## 2022-06-02 LAB — COMPLETE METABOLIC PANEL WITH GFR
AG Ratio: 1.1 (calc) (ref 1.0–2.5)
ALT: 9 U/L (ref 9–46)
AST: 9 U/L — ABNORMAL LOW (ref 10–35)
Albumin: 3.7 g/dL (ref 3.6–5.1)
Alkaline phosphatase (APISO): 94 U/L (ref 35–144)
BUN: 8 mg/dL (ref 7–25)
CO2: 30 mmol/L (ref 20–32)
Calcium: 8.9 mg/dL (ref 8.6–10.3)
Chloride: 104 mmol/L (ref 98–110)
Creat: 0.86 mg/dL (ref 0.70–1.30)
Globulin: 3.4 g/dL (calc) (ref 1.9–3.7)
Glucose, Bld: 85 mg/dL (ref 65–99)
Potassium: 4.5 mmol/L (ref 3.5–5.3)
Sodium: 142 mmol/L (ref 135–146)
Total Bilirubin: 0.3 mg/dL (ref 0.2–1.2)
Total Protein: 7.1 g/dL (ref 6.1–8.1)
eGFR: 104 mL/min/{1.73_m2} (ref >=60)

## 2022-06-02 LAB — C-REACTIVE PROTEIN: CRP: 6 mg/L (ref <8.0)

## 2022-06-02 LAB — SEDIMENTATION RATE: Sed Rate: 17 mm/h (ref 0–20)

## 2022-06-04 ENCOUNTER — Other Ambulatory Visit (HOSPITAL_COMMUNITY): Payer: Self-pay

## 2022-06-21 ENCOUNTER — Other Ambulatory Visit (HOSPITAL_COMMUNITY): Payer: Self-pay

## 2022-06-25 ENCOUNTER — Other Ambulatory Visit: Payer: Self-pay

## 2022-06-25 ENCOUNTER — Other Ambulatory Visit (HOSPITAL_COMMUNITY): Payer: Self-pay

## 2022-06-26 ENCOUNTER — Telehealth: Payer: Self-pay

## 2022-06-26 ENCOUNTER — Other Ambulatory Visit (HOSPITAL_COMMUNITY): Payer: Self-pay

## 2022-06-26 ENCOUNTER — Other Ambulatory Visit: Payer: Self-pay

## 2022-06-26 NOTE — Telephone Encounter (Signed)
Received notification from [facility] that pt's insurance was rejecting due to non-payment of premium. There was previous discussion with pt regarding dropping his [employer] plan altogether in favor of using Medicaid as his primary insurance. ATC pt to discuss, LVM advising him to go ahead and contact Medicaid to ensure that they are aware that they are now the primary insurance if this is indeed the case. Requested he contact the Rheum clinic and ask to speak with the pharmacy team if he had any further questions.

## 2022-06-29 ENCOUNTER — Other Ambulatory Visit (HOSPITAL_COMMUNITY): Payer: Self-pay

## 2022-07-03 ENCOUNTER — Other Ambulatory Visit (HOSPITAL_COMMUNITY): Payer: Self-pay

## 2022-07-11 ENCOUNTER — Other Ambulatory Visit (HOSPITAL_COMMUNITY): Payer: Self-pay

## 2022-07-11 NOTE — Telephone Encounter (Signed)
Patient has not followed up with clinic or pharmacy. I spoke with patient today. He states that he has been in contact with [employer]. He had told the insurance that he didn't have a job when he signed up. He has been in communication and is working as fast as he can on it. I did ask if he definitely needed [employer] plan form marketplace if he has active Medicaid. He has been advised to follow up with [facility] if his Christella Scheuermann is active again. Otherwise if he drops [employer] and only has Medicaid, we will have to reauthorize Enbrel through his Medicaid since it would be primary. Will continue to f/u  Knox Saliva, PharmD, MPH, BCPS, CPP Clinical Pharmacist (Rheumatology and Pulmonology)

## 2022-07-12 ENCOUNTER — Telehealth: Payer: Self-pay | Admitting: Internal Medicine

## 2022-07-12 NOTE — Telephone Encounter (Signed)
Patient left a voicemail stating he was returning the pharmacist's call regarding his insurance.

## 2022-07-18 ENCOUNTER — Other Ambulatory Visit (HOSPITAL_COMMUNITY): Payer: Self-pay

## 2022-07-18 NOTE — Telephone Encounter (Signed)
This has been address in previous encounter - patient has cancelled [place] but will now plan to call Medicaid to advise that they are primary  Knox Saliva, PharmD, MPH, BCPS, CPP Clinical Pharmacist (Rheumatology and Pulmonology)

## 2022-07-18 NOTE — Telephone Encounter (Signed)
Spoke with patient regarding insurance coverage. He stated he cancelled his [employer] plan earlier this week. Explained he will need to reach out to Medicaid to let them know that they are his primary insurance. Patient confirmed that he would reach out to [facility].  Maryan Puls, PharmD PGY-1 [facility] Pharm[facility] Resident

## 2022-07-23 ENCOUNTER — Other Ambulatory Visit (HOSPITAL_COMMUNITY): Payer: Self-pay

## 2022-07-23 NOTE — Telephone Encounter (Signed)
Per test claim of Enbrel today, Medicaid is still not aware that patient has cancelled [facility] plan. Will await f/u from pt.  Knox Saliva, PharmD, MPH, BCPS, CPP Clinical Pharmacist (Rheumatology and Pulmonology)

## 2022-07-25 ENCOUNTER — Telehealth: Payer: Self-pay

## 2022-07-25 NOTE — Progress Notes (Signed)
Patient attempted to be outreached by Lazarus Gowda, PharmD Candidate on 07/25/2022 to discuss hypertension. Left voicemail for patient to return our call at their convenience at [phone].   Lazarus Gowda, PharmD Candidate   Joseph Art, Pharm.D. PGY-2 [facility] Pharmacy Resident

## 2022-07-26 ENCOUNTER — Other Ambulatory Visit: Payer: Self-pay

## 2022-07-26 ENCOUNTER — Other Ambulatory Visit (HOSPITAL_COMMUNITY): Payer: Self-pay

## 2022-07-27 ENCOUNTER — Other Ambulatory Visit: Payer: Self-pay

## 2022-07-27 ENCOUNTER — Other Ambulatory Visit (HOSPITAL_COMMUNITY): Payer: Self-pay

## 2022-07-27 NOTE — Telephone Encounter (Signed)
Per [facility] records, Enbrel has successfully adjudicated through [facility] plan and pt will be shipping on 07/30/2022  Knox Saliva, PharmD, MPH, BCPS, CPP Clinical Pharmacist (Rheumatology and Pulmonology)

## 2022-07-30 ENCOUNTER — Other Ambulatory Visit: Payer: Self-pay

## 2022-07-30 DIAGNOSIS — M059 Rheumatoid arthritis with rheumatoid factor, unspecified: Secondary | ICD-10-CM

## 2022-07-31 MED ORDER — LISINOPRIL-HYDROCHLOROTHIAZIDE 20-12.5 MG PO TABS: 1.0000 | ORAL_TABLET | Freq: Every day | ORAL | 2 refills | Status: DC

## 2022-07-31 MED ORDER — INSULIN LISPRO PROT & LISPRO (75-25 MIX) 100 UNIT/ML KWIKPEN: 25.0000 [IU] | PEN_INJECTOR | Freq: Two times a day (BID) | SUBCUTANEOUS | 3 refills | Status: DC

## 2022-07-31 MED ORDER — PREDNISONE 5 MG PO TABS
5.0000 mg | ORAL_TABLET | Freq: Every day | ORAL | 1 refills | Status: DC
  Filled 2022-10-16: qty 30, 30d supply, fill #0

## 2022-07-31 MED ORDER — HYDROXYCHLOROQUINE SULFATE 200 MG PO TABS
400.0000 mg | ORAL_TABLET | Freq: Every day | ORAL | 1 refills | Status: DC
  Filled 2022-10-16: qty 60, 30d supply, fill #0

## 2022-07-31 MED ORDER — APIXABAN 5 MG PO TABS: 5.0000 mg | ORAL_TABLET | Freq: Two times a day (BID) | ORAL | 1 refills | Status: DC

## 2022-08-01 LAB — HM DIABETES EYE EXAM

## 2022-08-06 NOTE — Progress Notes (Unsigned)
CC: routine visit  HPI:  [name] is a 52 y.o. male living with a history stated below and presents today for routine office visit. Please see problem based assessment and plan for additional details.  Past Medical History:  Diagnosis Date   Diabetes mellitus without complication (HCC)    High cholesterol    Hypertension     Current Outpatient Medications on File Prior to Visit  Medication Sig Dispense Refill   apixaban (ELIQUIS) 5 MG TABS tablet Take 1 tablet (5 mg total) by mouth 2 (two) times daily. 60 tablet 1   blood glucose meter kit and supplies KIT Use up to four times daily as directed. 1 each 0   Etanercept (ENBREL MINI) 50 MG/ML SOCT Inject 50 mg into the skin once a week. First dose completed in clinic with sample on 05/31/2022 4 mL 2   Fingerstix Lancets MISC use as directed to check blood sugars up to 4 times daily 123XX123 each 0   folic acid (FOLVITE) 1 MG tablet Take 1 mg by mouth daily.     glucose blood test strip use as directed to check blood sugars up to 4 times daily 100 each 0   hydroxychloroquine (PLAQUENIL) 200 MG tablet Take 2 tablets (400 mg total) by mouth daily. 60 tablet 2   Insulin Lispro Prot & Lispro (HUMALOG 75/25 MIX) (75-25) 100 UNIT/ML Kwikpen Inject 25 Units into the skin 2 (two) times daily with a meal. 15 mL 3   Insulin Pen Needle 31G X 8 MM MISC Use as needed to inject insulin. 100 each 0   lisinopril-hydrochlorothiazide (ZESTORETIC) 20-12.5 MG tablet Take 1 tablet by mouth daily. 30 tablet 2   metFORMIN (GLUCOPHAGE) 500 MG tablet TAKE 1 TABLET BY MOUTH 2 TIMES DAILY WITH A MEAL. 60 tablet 2   predniSONE (DELTASONE) 5 MG tablet Take 1 tablet (5 mg total) by mouth daily with breakfast. 30 tablet 2   [DISCONTINUED] insulin aspart protamine- aspart (NOVOLOG MIX 70/30) (70-30) 100 UNIT/ML injection Inject 0.45 mLs (45 Units total) into the skin 2 (two) times daily with a meal. 10 mL 6   No current facility-administered medications on file  prior to visit.    Family History  Problem Relation Age of Onset   Rheum arthritis Mother    Diabetes Other     Social History   Socioeconomic History   Marital status: Single    Spouse name: Not on file   Number of children: Not on file   Years of education: Not on file   Highest education level: Not on file  Occupational History   Not on file  Tobacco Use   Smoking status: Some Days    Packs/day: 0.50    Years: 30.00    Additional pack years: 0.00    Total pack years: 15.00    Types: Cigarettes    Passive exposure: Current   Smokeless tobacco: Never  Vaping Use   Vaping Use: Never used  Substance and Sexual Activity   Alcohol use: No   Drug use: Not Currently   Sexual activity: Yes    Birth control/protection: Condom  Other Topics Concern   Not on file  Social History Narrative   Not on file   Social Determinants of Health   Financial Resource Strain: Not on file  Food Insecurity: No Food Insecurity (02/23/2022)   Hunger Vital Sign    Worried About Running Out of Food in the Last Year: Never true  Ran Out of Food in the Last Year: Never true  Transportation Needs: No Transportation Needs (02/23/2022)   PRAPARE - Hydrologist (Medical): No    Lack of Transportation (Non-Medical): No  Physical Activity: Not on file  Stress: Not on file  Social Connections: Not on file  Intimate Partner Violence: Not At Risk (02/23/2022)   Humiliation, Afraid, Rape, and Kick questionnaire    Fear of Current or Ex-Partner: No    Emotionally Abused: No    Physically Abused: No    Sexually Abused: No    Review of Systems: ROS negative except for what is noted on the assessment and plan.  Vitals:   08/07/22 0833  BP: (!) 173/114  Pulse: 87  Temp: 98.4 F (36.9 C)  TempSrc: Oral  SpO2: 99%  Weight: 282 lb 14.4 oz (128.3 kg)    Physical Exam: Constitutional: well-appearing *** sitting in ***, in no acute distress HENT: normocephalic  atraumatic, mucous membranes moist Eyes: conjunctiva non-erythematous Cardiovascular: regular rate and rhythm, no m/r/g Pulmonary/Chest: normal work of breathing on room air, lungs clear to auscultation bilaterally Abdominal: soft, non-tender, non-distended MSK: normal bulk and tone Neurological: alert & oriented x 3, no focal deficit Skin: warm and dry Psych: normal mood and behavior  Assessment & Plan:   DM: A1c 12.2 > 5.7  On Humalog 75/25 mix 25 units twice daily and metformin 500 bid -Start SGLT2 or GLP-1? Instead of insulin?  Hypertension: On lisinopril-HCTZ 20-12.5 mg (off x 1 month) -BMP today (started in October, had BMP in jan w cr 0.86 and normal electrolytes)  PE: Discharged on 10/6 after being diagnosed with pulmonary embolism -eliquis  5 mg twice daily (3-6 mo)  Depression: phq-9 = 10, unemployed and can't take care of daughter  Patient {GC/[id number]::"discussed with","seen with"} Dr. [id number]. Hoffman","Mullen","Narendra","Vincent","Guilloud","Lau","Machen"}  No problem-specific Assessment & Plan notes found for this encounter.   Buddy Duty, D.O. [facility], PGY-2 Phone: [phone] Date 08/07/2022 Time 9:00 AM

## 2022-08-07 ENCOUNTER — Encounter: Payer: Self-pay | Admitting: Internal Medicine

## 2022-08-07 ENCOUNTER — Ambulatory Visit (INDEPENDENT_AMBULATORY_CARE_PROVIDER_SITE_OTHER): Payer: Medicaid Other | Admitting: Internal Medicine

## 2022-08-07 VITALS — BP 165/96 | HR 84 | Temp 98.4°F | Wt 282.9 lb

## 2022-08-07 DIAGNOSIS — Z794 Long term (current) use of insulin: Secondary | ICD-10-CM

## 2022-08-07 DIAGNOSIS — Z7985 Long-term (current) use of injectable non-insulin antidiabetic drugs: Secondary | ICD-10-CM | POA: Diagnosis not present

## 2022-08-07 DIAGNOSIS — Z7984 Long term (current) use of oral hypoglycemic drugs: Secondary | ICD-10-CM | POA: Diagnosis not present

## 2022-08-07 DIAGNOSIS — F32A Depression, unspecified: Secondary | ICD-10-CM

## 2022-08-07 DIAGNOSIS — I2699 Other pulmonary embolism without acute cor pulmonale: Secondary | ICD-10-CM | POA: Diagnosis not present

## 2022-08-07 DIAGNOSIS — E119 Type 2 diabetes mellitus without complications: Secondary | ICD-10-CM

## 2022-08-07 DIAGNOSIS — I10 Essential (primary) hypertension: Secondary | ICD-10-CM | POA: Diagnosis not present

## 2022-08-07 LAB — GLUCOSE, CAPILLARY: Glucose-Capillary: 162 mg/dL — ABNORMAL HIGH (ref 70–99)

## 2022-08-07 LAB — POCT GLYCOSYLATED HEMOGLOBIN (HGB A1C): Hemoglobin A1C: 5.7 % — AB (ref 4.0–5.6)

## 2022-08-07 MED ORDER — SEMAGLUTIDE(0.25 OR 0.5MG/DOS) 2 MG/3ML ~~LOC~~ SOPN: 0.2500 mg | PEN_INJECTOR | SUBCUTANEOUS | 3 refills | Status: DC

## 2022-08-07 MED ORDER — INSULIN LISPRO PROT & LISPRO (75-25 MIX) 100 UNIT/ML KWIKPEN: 12.0000 [IU] | PEN_INJECTOR | Freq: Two times a day (BID) | SUBCUTANEOUS | 1 refills | Status: DC

## 2022-08-07 MED ORDER — SERTRALINE HCL 25 MG PO TABS: 25.0000 mg | ORAL_TABLET | Freq: Every day | ORAL | 1 refills | Status: DC

## 2022-08-07 NOTE — Progress Notes (Signed)
Subjective:   Patient ID: Jeremiah Alexander male   DOB: 16-Jun-1970 52 y.o.   MRN: [id number]  HPI: Jeremiah Alexander is a 52 y.o. male with a PMHx of RA, HTN, recent PE, and T2DM who presents for follow up.  Patient reports he has been out of his lisinopril-HCTZ for the past month as he has had trouble obtaining his refill at the pharmacy. Patient reports he only checks his BP about once a month, but when he does, it's always elevated (around 173/114). He denies any associated headache, abdominal pain, chest pain, dyspnea, or palpitations.   Pt reports fasting blood glucose has been around 100 at home. He checks twice daily before meals. He is compliant on his Humalog 25 units BID and metformin 1000 mg BID. He has followed up with ophthalmology recently.   Patient reports he does feel depressed most days of the week. He gets upset that he cannot provide for his daughter as he has been unemployed due to his medical conditions and has not been able to get disability. He has passive suicidal thoughts once a week. Does not have a plan. Denies access to firearms or weapons in the house. His daughter seems to be a protective factor.   Patient compliant on his daily Eliquis. Denies DVT, SOB, CP, melena, hematochezia, easy bruising, or dizziness.   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 3 mL 3   sertraline (ZOLOFT) 25 MG tablet Take 1 tablet (25 mg total) by mouth daily. 30 tablet 1   apixaban (ELIQUIS) 5 MG TABS tablet Take 1 tablet (5 mg total) by mouth 2 (two) times daily. 60 tablet 1   blood glucose meter kit and supplies KIT Use up to four times daily as directed. 1 each 0   Etanercept (ENBREL MINI) 50 MG/ML SOCT Inject 50 mg into the skin once a week. First dose completed in clinic with sample on 05/31/2022 4 mL 2   Fingerstix Lancets MISC use as directed to check blood sugars up to 4 times daily 123XX123 each 0   folic acid (FOLVITE) 1 MG tablet Take 1 mg by mouth daily.     glucose blood test strip use as directed to check blood sugars up to 4 times daily 100 each 0   hydroxychloroquine (PLAQUENIL) 200 MG tablet Take 2 tablets (400 mg total) by mouth daily. 60 tablet 2   Insulin Lispro Prot & Lispro (HUMALOG 75/25 MIX) (75-25) 100 UNIT/ML Kwikpen Inject 12 Units into the skin 2 (two) times daily with a meal. 15 mL 1   Insulin Pen Needle 31G X 8 MM MISC Use as needed to inject insulin. 100 each 0   lisinopril-hydrochlorothiazide (ZESTORETIC) 20-12.5 MG tablet Take 1 tablet by mouth daily. 30 tablet 2  metFORMIN (GLUCOPHAGE) 500 MG tablet TAKE 1 TABLET BY MOUTH 2 TIMES DAILY WITH A MEAL. 60 tablet 2   predniSONE (DELTASONE) 5 MG tablet Take 1 tablet (5 mg total) by mouth daily with breakfast. 30 tablet 2   No current facility-administered medications for this visit.     Review of Systems: Pertinent ROS present in the HPI. Otherwise negative.   Objective:   Physical Exam: Vitals:   08/07/22 0833 08/07/22 0909  BP: (!) 173/114 (!) 165/96  Pulse: 87 84  Temp: 98.4 F (36.9 C)   TempSrc: Oral   SpO2: 99%   Weight: 282 lb 14.4 oz (128.3 kg)    Constitutional: no acute distress Cardiovascular: Regular rate, regular rhythm. No murmurs, rubs, or gallops.   Pulmonary: Normal respiratory effort. No wheezes, rales, or rhonchi.   Musculoskeletal: Normal range of motion.   Neurological: Alert and oriented to person, place, and time Skin: warm and dry.    Assessment & Plan:   HYPERTENSION, BENIGN ESSENTIAL BP 173/114 in the office today which is around what patient measures at home. He has not had his lisinopril-HCTZ in the past month due to trouble getting if from the pharmacy. He remains asymptomatic and denies chest pain, palpitations, dyspnea, dizziness, visual changes, or abdominal pain.  Will refill this today and follow up in a month.   > lisinopril-HCTZ 20-12.5  Diabetes (HCC) Pt compliant on humalog 25 units BID and metformin 1000 mg BID. Home fasting glucose readings have been around 100 per patient. A1c was 5.7 today. Given his low A1c, there is concern that patient may be having hypoglycemic episodes although he does not bring his meter today to confirm this. States he has not had any episodes where he felt hypoglycemic with diaphoresis and shivering, however there is still some concern. Will cut the humalog dose in  half and start patient weekly Ozempic. If he tolerates this well, may titrate the Ozempic up in the hopes of weaning him off the insulin.   > Humalog 12 units BID > metformin 1000 mg BID > Ozempic 0.25 mg weekly injection  Depression Pt reports feeling depressed most days of the week with passive suicidal thoughts once weekly. PHQ-9 score was 10. He denies any access to firearms or weapons at home. Believe he would benefit from speaking with our counselor and taking antidepressant.   > Zoloft 25 mg daily > Counselor referral   Pulmonary embolism ([place]) Pt compliant on his Eliquis 5 mg daily. Denies hematochezia, melena, easy bruising. No chest pain, LE swelling, or shortness of breath. Given PE in October was unprovoked, will continue with anticoagulation.   > Eliquis 5 mg

## 2022-08-07 NOTE — Assessment & Plan Note (Signed)
Pt compliant on humalog 25 units BID and metformin 1000 mg BID. Home fasting glucose readings have been around 100 per patient. A1c was 5.7 today. Given his low A1c, there is concern that patient may be having hypoglycemic episodes although he does not bring his meter today to confirm this. States he has not had any episodes where he felt hypoglycemic with diaphoresis and shivering, however there is still some concern. Will cut the humalog dose in  half and start patient weekly Ozempic. If he tolerates this well, may titrate the Ozempic up in the hopes of weaning him off the insulin.   > Humalog 12 units BID > metformin 1000 mg BID > Ozempic 0.25 mg weekly injection

## 2022-08-07 NOTE — Assessment & Plan Note (Addendum)
Pt reports feeling depressed most days of the week with passive suicidal thoughts once weekly. PHQ-9 score was 10. He denies any access to firearms or weapons at home. Believe he would benefit from speaking with our counselor and taking antidepressant.   > Zoloft 25 mg daily > Counselor referral

## 2022-08-07 NOTE — Assessment & Plan Note (Signed)
Pt compliant on his Eliquis 5 mg daily. Denies hematochezia, melena, easy bruising. No chest pain, LE swelling, or shortness of breath. Given PE in October was unprovoked, will continue with anticoagulation.   > Eliquis 5 mg

## 2022-08-07 NOTE — Patient Instructions (Addendum)
Today we discussed the following:  1) Hypertension Please begin taking lisinopril-HCTZ once daily.   2) Type II Diabetes Mellitus Please continue taking metformin 1000 mg twice daily. Your Humalog as been cut in half and you should take 12 units twice daily. Begin taking Ozempic injections once weekly. Please bring your glucometer to your next visit.   3) Depression Begin taking sertraline once daily.

## 2022-08-07 NOTE — Assessment & Plan Note (Addendum)
BP 173/114 in the office today which is around what patient measures at home. He has not had his lisinopril-HCTZ in the past month due to trouble getting if from the pharmacy. He remains asymptomatic and denies chest pain, palpitations, dyspnea, dizziness, visual changes, or abdominal pain.  Will refill this today and follow up in a month.   > lisinopril-HCTZ 20-12.5

## 2022-08-08 NOTE — Progress Notes (Signed)
Internal Medicine Clinic Attending ° °Case discussed with Dr. Atway  At the time of the visit.  We reviewed the resident’s history and exam and pertinent patient test results.  I agree with the assessment, diagnosis, and plan of care documented in the resident’s note.  °

## 2022-08-09 ENCOUNTER — Other Ambulatory Visit (HOSPITAL_COMMUNITY): Payer: Self-pay

## 2022-08-09 ENCOUNTER — Other Ambulatory Visit: Payer: Medicaid Other | Admitting: Pharmacist

## 2022-08-09 DIAGNOSIS — E119 Type 2 diabetes mellitus without complications: Secondary | ICD-10-CM

## 2022-08-09 DIAGNOSIS — F32A Depression, unspecified: Secondary | ICD-10-CM

## 2022-08-09 MED ORDER — SEMAGLUTIDE(0.25 OR 0.5MG/DOS) 2 MG/3ML ~~LOC~~ SOPN
0.2500 mg | PEN_INJECTOR | SUBCUTANEOUS | 3 refills | Status: AC
  Filled 2022-08-09: qty 3, 56d supply, fill #0
  Filled 2023-03-25: qty 3, 56d supply, fill #1
  Filled 2023-05-16: qty 3, 56d supply, fill #2
  Filled 2023-07-16: qty 3, 56d supply, fill #3

## 2022-08-09 MED ORDER — METFORMIN HCL 500 MG PO TABS
500.0000 mg | ORAL_TABLET | Freq: Two times a day (BID) | ORAL | 1 refills | Status: DC
  Filled 2022-08-09 – 2022-10-16 (×3): qty 180, 90d supply, fill #0
  Filled 2023-03-25: qty 180, 90d supply, fill #1

## 2022-08-09 MED ORDER — SERTRALINE HCL 25 MG PO TABS
25.0000 mg | ORAL_TABLET | Freq: Every day | ORAL | 1 refills | Status: AC
  Filled 2022-08-09 – 2022-08-10 (×2): qty 30, 30d supply, fill #0

## 2022-08-09 MED ORDER — INSULIN LISPRO PROT & LISPRO (75-25 MIX) 100 UNIT/ML KWIKPEN
12.0000 [IU] | PEN_INJECTOR | Freq: Two times a day (BID) | SUBCUTANEOUS | 1 refills | Status: AC
  Filled 2022-08-09 – 2023-03-25 (×3): qty 15, 63d supply, fill #0
  Filled 2023-05-16: qty 15, 63d supply, fill #1

## 2022-08-09 MED ORDER — APIXABAN 5 MG PO TABS
5.0000 mg | ORAL_TABLET | Freq: Two times a day (BID) | ORAL | 1 refills | Status: DC
  Filled 2022-08-09 – 2022-10-16 (×3): qty 180, 90d supply, fill #0
  Filled 2023-03-25: qty 180, 90d supply, fill #1

## 2022-08-09 MED ORDER — LISINOPRIL-HYDROCHLOROTHIAZIDE 20-12.5 MG PO TABS
1.0000 | ORAL_TABLET | Freq: Every day | ORAL | 1 refills | Status: DC
  Filled 2022-08-09 – 2022-08-10 (×2): qty 90, 90d supply, fill #0

## 2022-08-09 MED ORDER — INSULIN PEN NEEDLE 31G X 8 MM MISC
0 refills | Status: AC
  Filled 2022-08-09: qty 100, 34d supply, fill #0

## 2022-08-09 NOTE — Progress Notes (Signed)
Care Coordination Call  Received referral from [facility] team regarding medication access.   Contacted patient, he notes he is unable to afford his Medicaid copays at this time. Has been unable to pick up sertraline or Ozempic yet as prescribed earlier this week, and has been out of lisinopril/HCTZ.   Discussed with the patient; scripts can be sent to a [place] and copays placed on a charge account for patient to patient in the future if/when able. Patient is interested in this plan. He selects to pick up meds at [facility].   Discussed with Dr. Raymondo Band; chronic meds under PCP sent to [place]. Per her approval, 90 day supplies sent for metformin, lisinopril/HCTZ, Eliquis as copays are $4 whether 30 or 90 day supply.   Will communicate with pharmacy team at [facility] to support patient's needs. Will follow up next week.   Catie Hedwig Morton, PharmD, [place], [facility] [phone]

## 2022-08-10 ENCOUNTER — Other Ambulatory Visit (HOSPITAL_COMMUNITY): Payer: Self-pay

## 2022-08-13 ENCOUNTER — Other Ambulatory Visit: Payer: Medicaid Other | Admitting: Pharmacist

## 2022-08-13 DIAGNOSIS — I10 Essential (primary) hypertension: Secondary | ICD-10-CM | POA: Diagnosis not present

## 2022-08-13 MED ORDER — BLOOD PRESSURE MONITOR DEVI: 0 refills | Status: AC

## 2022-08-13 NOTE — Progress Notes (Signed)
08/13/2022 Name: Jeremiah Alexander MRN: [id number] DOB: 05-Jul-1970  Chief Complaint  Patient presents with   Medication Management   Hypertension    Jeremiah Alexander is a 52 y.o. year old male who presented for a telephone visit.   They were referred to the pharmacist by their PCP for assistance in managing medication access.   Patient is participating in a Managed Medicaid Plan:  Yes  Subjective:  Care Team Primary Care Provider: Starlyn Skeans, MD ; Next Scheduled Visit: due in April  Medication Access/Adherence  Current Pharmacy:  [id number] [place] [place] - [address] [place] [address] Phone: [phone] Fax: [phone]  [id number] [address] [place] [facility] [address] [place] [address] Phone: [phone] Fax: [phone]  [facility] [address] [place] [address] Phone: [phone] Fax: [place] [address] Phone: [phone] Fax: [phone]  [place] [address] [place] [address] Phone: [phone] Fax: [place], [place] - [address] [place] [address] Phone: [phone] Fax: [phone]   Patient reports affordability concerns with their medications: Yes  Patient reports access/transportation concerns to their pharmacy: Yes  Patient reports adherence concerns with their medications:  No    Confirms he was able to pick up medications from [place] this week.   Diabetes:  Current medications: metformin 500 mg twice daily, Humalog 75/25 12 units twice daily, Ozempic 0.25 mg weekly - just picked up  Hypertension:  Current medications: lisinopril/HCTZ 20/12.5 mg daily - just picked up   Patient does not have a  validated, automated, upper arm home BP cuff  Hyperlipidemia/ASCVD Risk Reduction  Current lipid lowering medications: none  PREVENT Risk Score: 10 year risk of CVD: 16.7% - 10 year risk of ASCVD: 10.1% - 10 year risk of HF: 15.1%    Objective:  Lab Results  Component Value Date   HGBA1C 5.7 (A) 08/07/2022    Lab Results  Component Value Date   CREATININE 0.86 05/31/2022   BUN 8 05/31/2022   NA 142 05/31/2022   K 4.5 05/31/2022   CL 104 05/31/2022   CO2 30 05/31/2022    Lab Results  Component Value Date   CHOL 197 04/22/2014   HDL 57 04/22/2014   LDLCALC 121 (H) 04/22/2014   TRIG 94 04/22/2014   CHOLHDL 2.6 07/21/2012    Medications Reviewed Today     Reviewed by Osker Mason, RPH-CPP (Pharmacist) on 08/09/22 at 1200  Med List Status: <None>   Medication Order Taking? Sig Documenting Provider Last Dose Status Informant  apixaban (ELIQUIS) 5 MG TABS tablet [id number] Yes Take 1 tablet (5 mg total) by mouth 2 (two) times daily. Mapp, Claudia Desanctis, MD Taking Active   blood glucose meter kit and supplies KIT [id number]  Use up to four times daily as directed. Elodia Florence., MD  Active   Etanercept (ENBREL MINI) 50 MG/ML SOCT [id number] Yes Inject 50 mg into the skin once a week. First dose completed in clinic with sample on 05/31/2022 Collier Salina, MD Taking Active   Fingerstix Lancets MISC [id number]  use as directed to check blood sugars up to 4 times daily Elodia Florence., MD  Active   folic  acid (FOLVITE) 1 MG tablet [id number] Yes Take 1 mg by mouth daily. [provider] Taking Active   glucose blood test strip [id number]  use as directed to check blood sugars up to 4 times daily Elodia Florence., MD  Active   hydroxychloroquine (PLAQUENIL) 200 MG tablet [id number] Yes Take 2 tablets (400 mg total) by mouth daily. Starlyn Skeans, MD Taking Active     Discontinued 02/23/22 1005 (Reorder)          Med Note Baruch Merl, Verneda Skill Feb 22, 2022  7:33 AM) Patient states has not had medication in longer than a month   Insulin Lispro Prot & Lispro (HUMALOG 75/25 MIX) (75-25) 100 UNIT/ML Claiborne Rigg [id number] Yes Inject 12 Units into the skin 2 (two) times daily with a meal. Atway, Rayann N, DO Taking Active   Insulin Pen Needle 31G X 8 MM MISC [id number] Yes Use as needed to inject insulin. Elodia Florence., MD Taking Active   lisinopril-hydrochlorothiazide (ZESTORETIC) 20-12.5 MG tablet [id number] Yes Take 1 tablet by mouth daily. Mapp, Claudia Desanctis, MD Taking Active   metFORMIN (GLUCOPHAGE) 500 MG tablet [id number] Yes TAKE 1 TABLET BY MOUTH 2 TIMES DAILY WITH A MEAL. Mapp, Claudia Desanctis, MD Taking Active   predniSONE (DELTASONE) 5 MG tablet [id number] Yes Take 1 tablet (5 mg total) by mouth daily with breakfast. Mapp, Tavien, MD Taking Active   Semaglutide,0.25 or 0.5MG /DOS, 2 MG/3ML SOPN [id number] Yes Inject 0.25 mg into the skin once a week. Dorethea Clan, DO Taking Active   sertraline (ZOLOFT) 25 MG tablet [id number] Yes Take 1 tablet (25 mg total) by mouth daily. Dorethea Clan, DO Taking Active             SDOH Interventions Today    Flowsheet Row Most Recent Value  SDOH Interventions   Transportation Interventions Other (Comment)  [mail order]  Financial Strain Interventions Other (Comment)  [copay account]       Assessment/Plan:   Diabetes: - Currently controlled but opportunity for optimization - Recommend to start Ozempic as instructed; continue metformin and reduced insulin dose. Goal of continued titration/elimination - Recommend to check glucose periodically, fasting and 2 hour post prandial  Hypertension: - Currently uncontrolled - Reviewed long term cardiovascular and renal outcomes of uncontrolled blood pressure - Reviewed appropriate blood pressure monitoring technique and reviewed goal blood pressure. Recommended to check home blood pressure and heart rate daily. Order sent to [facility] for BP machine to  be billed under patient's Medicaid under system wide standing order - Recommend to continue current regimen   Hyperlipidemia/ASCVD Risk Reduction: - Currently uncontrolled.  - Recommend to check updated lipid panel with next labs and start statin therapy for primary prevention  Follow Up Plan: phone call in 2 weeks  Catie Hedwig Morton, PharmD, [place], [place] [phone]

## 2022-08-13 NOTE — Progress Notes (Unsigned)
Office Visit Note  Patient: Jeremiah Alexander             Date of Birth: 1970/12/31           MRN: [id number]             PCP: Starlyn Skeans, MD Referring: Starlyn Skeans, MD Visit Date: 08/14/2022   Subjective:  No chief complaint on file.   History of Present Illness: Jeremiah Alexander is a 52 y.o. male here for follow up ***   Previous HPI 05/31/22 Jeremiah Alexander is a 52 y.o. male here for seropositive rheumatoid arthritis.  He is currently taking hydroxychloroquine 400 mg daily and prednisone 5 mg daily.  He was initially diagnosed with rheumatoid arthritis last year at [facility] in [place].  Initial symptom onset was since at least about 2 years ago with gradually progressing worsening pain and swelling and weakness affecting his right hand and wrist.  Then involving both sides and subsequently proceeded to have worsening pain and swelling affecting both of his knees and feet as well.  Currently knee pain and swelling is his most problematic symptom and has severely reduced his mobility.  Laboratory findings showed highly positive rheumatoid factor and a low positive anti-CCP antibodies.  He was also started on methotrexate that he took for only a short time seems to have been discontinued just due to lack of refill or follow-up rather than from any particular intolerance or contraindication.  However never had dramatic symptomatic improvement.  He was also recommended to start Enbrel but initially this was stopped after losing his medical insurance. He was hospitalized with acute pulmonary embolism last year thought most likely coming from his uncontrolled inflammatory arthritis and immobility with no previous history of blood clots or coagulation disorder.  He is now on indefinite Eliquis anticoagulation. He continues to smoke cigarettes on some days with >20 year total history.   Labs reviewed RF 187 CCP 39   DMARD Hx Prednisone - current Methotrexate - Short duration in 2023  unintentionally stopped Plaquenil - current Enbrel - planned   No Rheumatology ROS completed.   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   HLD (hyperlipidemia) 05/31/2022   Infection of the upper respiratory tract 05/31/2022   Seropositive rheumatoid arthritis ([place]) 03/09/2022   Preventative health care 03/09/2022   Pulmonary embolism without acute cor pulmonale ([place]) 02/22/2022   Pulmonary embolism ([place]) 02/22/2022   TOBACCO ABUSE 06/05/2010   Diabetes ([place]) 05/08/2010   OBESITY 05/08/2010   HYPERTENSION, BENIGN ESSENTIAL 05/08/2010    Past Medical History:  Diagnosis Date   Diabetes mellitus without complication (HCC)    High cholesterol    Hypertension     Family History  Problem Relation Age of Onset   Rheum arthritis Mother    Diabetes Other    Past Surgical History:  Procedure Laterality Date   TOE AMPUTATION     Right Pinky Toe   Social History   Social History Narrative   Not on file   Immunization History  Administered Date(s) Administered   Influenza Whole 04/12/2010   Tdap 03/09/2022     Objective: Vital Signs: There were no vitals taken for this visit.   Physical Exam   Musculoskeletal Exam: ***  CDAI Exam: CDAI Score: -- Patient Global: --; Provider Global: --  Swollen: --; Tender: -- Joint Exam 08/14/2022   No joint exam has been documented for this visit   There is currently no information documented on the homunculus. Go to the Rheumatology activity and complete the homunculus joint exam.  Investigation: No additional findings.  Imaging: No results found.  Recent Labs: Lab Results  Component Value Date   WBC 7.2 05/31/2022   HGB 13.6 05/31/2022   PLT 341 05/31/2022   NA 142 05/31/2022   K 4.5 05/31/2022   CL 104 05/31/2022   CO2 30 05/31/2022    GLUCOSE 85 05/31/2022   BUN 8 05/31/2022   CREATININE 0.86 05/31/2022   BILITOT 0.3 05/31/2022   ALKPHOS 89 07/21/2012   AST 9 (L) 05/31/2022   ALT 9 05/31/2022   PROT 7.1 05/31/2022   ALBUMIN 3.6 04/22/2014   CALCIUM 8.9 05/31/2022   GFRAA >60 10/12/2019   QFTBGOLDPLUS NEGATIVE 05/31/2022    Speciality Comments: Enbrel started 05/31/2022  Procedures:  No procedures performed Allergies: Patient has no known allergies.   Assessment / Plan:     Visit Diagnoses: No diagnosis found.  ***  Orders: No orders of the defined types were placed in this encounter.  No orders of the defined types were placed in this encounter.    Follow-Up Instructions: No follow-ups on file.   Collier Salina, MD  Note - This record has been created using [employer].  Chart creation errors have been sought, but may not always  have been located. Such creation errors do not reflect on  the standard of medical care.

## 2022-08-14 ENCOUNTER — Encounter: Payer: Self-pay | Admitting: Internal Medicine

## 2022-08-14 ENCOUNTER — Telehealth: Payer: Self-pay | Admitting: *Deleted

## 2022-08-14 ENCOUNTER — Encounter: Payer: Self-pay | Admitting: Dietician

## 2022-08-14 ENCOUNTER — Ambulatory Visit: Payer: Medicaid Other | Attending: Internal Medicine | Admitting: Internal Medicine

## 2022-08-14 VITALS — BP 173/105 | HR 75 | Resp 16 | Ht 72.0 in | Wt 284.0 lb

## 2022-08-14 DIAGNOSIS — M059 Rheumatoid arthritis with rheumatoid factor, unspecified: Secondary | ICD-10-CM

## 2022-08-14 DIAGNOSIS — M174 Other bilateral secondary osteoarthritis of knee: Secondary | ICD-10-CM

## 2022-08-14 DIAGNOSIS — Z79899 Other long term (current) drug therapy: Secondary | ICD-10-CM

## 2022-08-14 NOTE — Patient Instructions (Signed)
Methotrexate Tablets What is this medication? METHOTREXATE (METH oh TREX ate) treats autoimmune conditions, such as arthritis and psoriasis. It works by decreasing inflammation, which can reduce pain and prevent long-term injury to the joints and skin. It may also be used to treat some types of cancer. It works by slowing down the growth of cancer cells. This medicine may be used for other purposes; ask your health care provider or pharmacist if you have questions. COMMON BRAND NAME(S): Rheumatrex, Trexall What should I tell my care team before I take this medication? They need to know if you have any of these conditions: Dehydration Diabetes Fluid in the stomach area or lungs Frequently drink alcohol Having surgery, including dental surgery High cholesterol Immune system problems Inflammatory bowel disease, such as ulcerative colitis Kidney disease Liver disease Low blood cell levels (white cells, red cells, and platelets) Lung disease Recent or ongoing radiation Recent or upcoming vaccine Stomach ulcers, other stomach or intestine problems An unusual or allergic reaction to methotrexate, other medications, foods, dyes, or preservatives Pregnant or trying to get pregnant Breastfeeding How should I use this medication? Take this medication by mouth with water. Take it as directed on the prescription label. Do not take extra. Keep taking this medication until your care team tells you to stop. Know why you are taking this medication and how you should take it. To treat conditions such as arthritis and psoriasis, this medication is taken ONCE A WEEK as a single dose or divided into 3 smaller doses taken 12 hours apart (do not take more than 3 doses 12 hours apart each week). This medication is NEVER taken daily to treat conditions other than cancer. Taking this medication more often than directed can cause serious side effects, even death. Talk to your care team about why you are taking this  medication, how often you will take it, and what your dose is. Ask your care team to put the reason you take this medication on the prescription. If you take this medication ONCE A WEEK, choose a day of the week before you start. Ask your pharmacist to include the day of the week on the label. Avoid "Monday", which could be misread as "Morning". Handling this medication may be harmful. Talk to your care team about how to handle this medication. Special instructions may apply. Talk to your care team about the use of this medication in children. While it may be prescribed for selected conditions, precautions do apply. Overdosage: If you think you have taken too much of this medicine contact a poison control center or emergency room at once. NOTE: This medicine is only for you. Do not share this medicine with others. What if I miss a dose? If you miss a dose, talk with your care team. Do not take double or extra doses. What may interact with this medication? Do not take this medication with any of the following: Acitretin Live virus vaccines Probenecid This medication may also interact with the following: Alcohol Aspirin and aspirin-like medications Certain antibiotics, such as penicillin, neomycin, sulfamethoxazole; trimethoprim Certain medications for stomach problems, such as lansoprazole, omeprazole, pantoprazole Clozapine Cyclosporine Dapsone Folic acid [place]et NSAIDs, medications for pain and inflammation, such as ibuprofen or naproxen Phenytoin Pyrimethamine Steroid medications, such as prednisone or cortisone Tacrolimus Theophylline This list may not describe all possible interactions. Give your health care provider a list of all the medicines, herbs, non-prescription drugs, or dietary supplements you use. Also tell them if you smoke, drink alcohol, or use   illegal drugs. Some items may interact with your medicine. What should I watch for while using this medication? Visit your  care team for regular checks on your progress. It may be some time before you see the benefit from this medication. You may need blood work done while you are taking this medication. If your care team has also prescribed folic acid, they may instruct you to skip your folic acid dose on the day you take methotrexate. This medication can make you more sensitive to the sun. Keep out of the sun. If you cannot avoid being in the sun, wear protective clothing and sunscreen. Do not use sun lamps, tanning beds, or tanning booths. Check with your care team if you have severe diarrhea, nausea, and vomiting, or if you sweat a lot. The loss of too much body fluid may make it dangerous for you to take this medication. This medication may increase your risk of getting an infection. Call your care team for advice if you get a fever, chills, sore throat, or other symptoms of a cold or flu. Do not treat yourself. Try to avoid being around people who are sick. Talk to your care team about your risk of cancer. You may be more at risk for certain types of cancers if you take this medication. Talk to your care team if you or your partner may be pregnant. Serious birth defects can occur if you take this medication during pregnancy and for 6 months after the last dose. You will need a negative pregnancy test before starting this medication. Contraception is recommended while taking this medication and for 6 months after the last dose. Your care team can help you find the option that works for you. If your partner can get pregnant, use a condom during sex while taking this medication and for 3 months after the last dose. Do not breastfeed while taking this medication and for 1 week after the last dose. This medication may cause infertility. Talk to your care team if you are concerned about your fertility. What side effects may I notice from receiving this medication? Side effects that you should report to your care team as soon  as possible: Allergic reactions--skin rash, itching, hives, swelling of the face, lips, tongue, or throat Dry cough, shortness of breath or trouble breathing Infection--fever, chills, cough, sore throat, wounds that don't heal, pain or trouble when passing urine, general feeling of discomfort or being unwell Kidney injury--decrease in the amount of urine, swelling of the ankles, hands, or feet Liver injury--right upper belly pain, loss of appetite, nausea, light-colored stool, dark yellow or brown urine, yellowing skin or eyes, unusual weakness or fatigue Low red blood cell level--unusual weakness or fatigue, dizziness, headache, trouble breathing Pain, tingling, or numbness in the hands or feet, muscle weakness, change in vision, confusion or trouble speaking, loss of balance or coordination, trouble walking, seizures Redness, blistering, peeling, or loosening of the skin, including inside the mouth Stomach bleeding--bloody or black, tar-like stools, vomiting blood or brown material that looks like coffee grounds Stomach pain that is severe, does not away, or gets worse Unusual bruising or bleeding Side effects that usually do not require medical attention (report these to your care team if they continue or are bothersome): Diarrhea Dizziness Hair loss Nausea Pain, redness, or swelling with sores inside the mouth or throat Skin reactions on sun-exposed areas Vomiting This list may not describe all possible side effects. Call your doctor for medical advice about side effects. You   may report side effects to FDA at 1-800-FDA-1088. Where should I keep my medication? Keep out of the reach of children and pets. Store at room temperature between 20 and 25 degrees C (68 and 77 degrees F). Protect from light. Keep the container tightly closed. Get rid of any unused medication after the expiration date. To get rid of medications that are no longer needed or have expired: Take the medication to a  medication take-back program. Check with your pharmacy or law enforcement to find a location. If you cannot return the medication, ask your pharmacist or care team how to get rid of this medication safely. NOTE: This sheet is a summary. It may not cover all possible information. If you have questions about this medicine, talk to your doctor, pharmacist, or health care provider.  2023 Elsevier/Gold Standard (2022-02-23 00:00:00)  

## 2022-08-14 NOTE — Telephone Encounter (Signed)
Per Dr. Benjamine Mola, patient to start Methotrexate pending labs.

## 2022-08-15 ENCOUNTER — Telehealth: Payer: Self-pay

## 2022-08-15 ENCOUNTER — Other Ambulatory Visit (HOSPITAL_COMMUNITY): Payer: Self-pay

## 2022-08-15 LAB — COMPLETE METABOLIC PANEL WITH GFR
AG Ratio: 1.1 (calc) (ref 1.0–2.5)
ALT: 9 U/L (ref 9–46)
AST: 9 U/L — ABNORMAL LOW (ref 10–35)
Albumin: 3.8 g/dL (ref 3.6–5.1)
Alkaline phosphatase (APISO): 83 U/L (ref 35–144)
BUN: 13 mg/dL (ref 7–25)
CO2: 31 mmol/L (ref 20–32)
Calcium: 9.3 mg/dL (ref 8.6–10.3)
Chloride: 104 mmol/L (ref 98–110)
Creat: 0.82 mg/dL (ref 0.70–1.30)
Globulin: 3.4 g/dL (calc) (ref 1.9–3.7)
Glucose, Bld: 104 mg/dL — ABNORMAL HIGH (ref 65–99)
Potassium: 4.4 mmol/L (ref 3.5–5.3)
Sodium: 140 mmol/L (ref 135–146)
Total Bilirubin: 0.5 mg/dL (ref 0.2–1.2)
Total Protein: 7.2 g/dL (ref 6.1–8.1)
eGFR: 105 mL/min/{1.73_m2} (ref >=60)

## 2022-08-15 LAB — CBC WITH DIFFERENTIAL/PLATELET
Absolute Monocytes: 503 cells/uL (ref 200–950)
Basophils Absolute: 60 cells/uL (ref 0–200)
Basophils Relative: 0.8 %
Eosinophils Absolute: 195 cells/uL (ref 15–500)
Eosinophils Relative: 2.6 %
HCT: 43 % (ref 38.5–50.0)
Hemoglobin: 14.1 g/dL (ref 13.2–17.1)
Lymphs Abs: 2288 cells/uL (ref 850–3900)
MCH: 29.1 pg (ref 27.0–33.0)
MCHC: 32.8 g/dL (ref 32.0–36.0)
MCV: 88.8 fL (ref 80.0–100.0)
MPV: 9.7 fL (ref 7.5–12.5)
Monocytes Relative: 6.7 %
Neutro Abs: 4455 cells/uL (ref 1500–7800)
Neutrophils Relative %: 59.4 %
Platelets: 332 10*3/uL (ref 140–400)
RBC: 4.84 10*6/uL (ref 4.20–5.80)
RDW: 13.1 % (ref 11.0–15.0)
Total Lymphocyte: 30.5 %
WBC: 7.5 10*3/uL (ref 3.8–10.8)

## 2022-08-15 LAB — SEDIMENTATION RATE: Sed Rate: 6 mm/h (ref 0–20)

## 2022-08-15 MED ORDER — METHOTREXATE SODIUM 2.5 MG PO TABS
15.0000 mg | ORAL_TABLET | ORAL | 1 refills | Status: DC
  Filled 2022-09-14 – 2022-10-16 (×2): qty 24, 28d supply, fill #0

## 2022-08-15 MED ORDER — FOLIC ACID 1 MG PO TABS
1.0000 mg | ORAL_TABLET | Freq: Every day | ORAL | 1 refills | Status: DC
  Filled 2022-09-14: qty 90, 90d supply, fill #0
  Filled 2022-10-16: qty 30, 30d supply, fill #0
  Filled 2023-03-25: qty 30, 30d supply, fill #1
  Filled 2023-05-16: qty 30, 30d supply, fill #2
  Filled 2023-07-16: qty 30, 30d supply, fill #3

## 2022-08-15 NOTE — Progress Notes (Signed)
Lab results look fine for continuing Enbrel and restarting the methotrexate as planned. His sedimentation rate is normal. So I am not sure how much of the continued knee pain and swelling is related to active RA versus existing wear and tear damage but hopefully the steroid injection would help for either problem.

## 2022-08-15 NOTE — Addendum Note (Signed)
Addended by: Collier Salina on: 08/15/2022 02:40 PM   Modules accepted: Orders

## 2022-08-15 NOTE — Telephone Encounter (Signed)
Prior Authorization for patient (ozempic) came through on cover my meds.. Per cover my meds portal "The member recently filled this medication and will be able to return for their next refill according to their plan limits."  Status:Approved;Review Type:Prior Auth;Coverage Start Date:03/12/2022;Coverage End Date:03/12/2023;

## 2022-08-15 NOTE — Telephone Encounter (Signed)
Sent Rx for MTX 15 mg PO weekly and folic acid 1 mg daily now. Please update patient.

## 2022-08-15 NOTE — Telephone Encounter (Signed)
Patient advised prescription for Methotrexate sent to the pharmacy. Patient expressed understanding.

## 2022-08-16 MED ORDER — LIDOCAINE HCL 1 % IJ SOLN
3.0000 mL | INTRAMUSCULAR | Status: AC | PRN
  Administered 2022-08-14: 3 mL

## 2022-08-16 MED ORDER — TRIAMCINOLONE ACETONIDE 40 MG/ML IJ SUSP
40.0000 mg | INTRAMUSCULAR | Status: AC | PRN
  Administered 2022-08-14: 40 mg via INTRA_ARTICULAR

## 2022-08-20 ENCOUNTER — Other Ambulatory Visit: Payer: Self-pay | Admitting: Pharmacist

## 2022-08-20 NOTE — Progress Notes (Signed)
Patient outreached by Darrall Dears, PharmD Candidate on 08/20/2022 to discuss hypertension    Patient has an automated home blood pressure machine. They just just received a cuff last week and report not using it yet. Patient was counseled to bring it to their next office visit for validation and education.   Medication review was performed. They are taking medications as prescribed.   The following barriers to adherence were noted:  - They do have cost concerns.  - They do not have transportation concerns.  - They do not need assistance obtaining refills.  - They do not occasionally forget to take some of their prescribed medications.  - They do not feel like one/some of their medications make them feel poorly.  - They do not have questions or concerns about their medications.  - They do have follow up scheduled with their primary care provider/cardiologist.   The following interventions were completed:  - Medications were reviewed  - Patient was educated on goal blood pressures and long term health implications of elevated blood pressure  - Patient was educated on proper technique to check home blood pressure and reminded to bring home machine and readings to next provider appointment  - Patient was educated on medications, including indication and administration  - Patient was counseled on lifestyle modifications to improve blood pressure, including diet/exercise. Patient reports having recent rheumatoid arthritis flare up that has prevented them from exercising recently.   The patient has follow up scheduled:  - 08/27/2022 with pharmacist Dr Jodi Mourning  - 10/16/2022 with with Dr. Elesa Hacker  PharmD Candidate   Catie Hedwig Morton, PharmD, [place], [facility] [phone]

## 2022-08-21 ENCOUNTER — Other Ambulatory Visit (HOSPITAL_COMMUNITY): Payer: Self-pay

## 2022-08-24 ENCOUNTER — Other Ambulatory Visit (HOSPITAL_COMMUNITY): Payer: Self-pay

## 2022-08-27 ENCOUNTER — Telehealth: Payer: Self-pay | Admitting: Pharmacist

## 2022-08-27 ENCOUNTER — Other Ambulatory Visit: Payer: Medicaid Other | Admitting: Pharmacist

## 2022-08-27 NOTE — Progress Notes (Unsigned)
Attempted to contact patient for scheduled appointment for medication management. Left HIPAA compliant message for patient to return my call at their convenience.    Catie T. Shakima Nisley, PharmD, BCACP, CPP [facility] [phone]  

## 2022-08-28 ENCOUNTER — Ambulatory Visit (INDEPENDENT_AMBULATORY_CARE_PROVIDER_SITE_OTHER): Payer: Medicaid Other | Admitting: Licensed Clinical Social Worker

## 2022-08-28 DIAGNOSIS — F32A Depression, unspecified: Secondary | ICD-10-CM

## 2022-08-28 NOTE — BH Specialist Note (Signed)
Patient no-showed today's appointment; appointment was for Telephone visit.  Patient will need to reschedule appointment by calling Internal medicine center [phone].  Christen Butter, MSW, LCSW-A [facility] Clinician [facility]  Internal Medicine Center Direct Dial:[phone]  Fax [phone] Main Office Phone: [phone] [address] [place] [place], [place] [address] Website: [facility] Internal Medicine [facility]  [place], [place]  Jeremiah Alexander

## 2022-09-03 ENCOUNTER — Other Ambulatory Visit: Payer: Self-pay | Admitting: Internal Medicine

## 2022-09-03 DIAGNOSIS — M059 Rheumatoid arthritis with rheumatoid factor, unspecified: Secondary | ICD-10-CM

## 2022-09-06 ENCOUNTER — Other Ambulatory Visit (HOSPITAL_COMMUNITY): Payer: Self-pay

## 2022-09-14 ENCOUNTER — Other Ambulatory Visit: Payer: Medicaid Other | Admitting: Pharmacist

## 2022-09-14 ENCOUNTER — Other Ambulatory Visit (HOSPITAL_COMMUNITY): Payer: Self-pay

## 2022-09-14 ENCOUNTER — Other Ambulatory Visit: Payer: Self-pay

## 2022-09-14 NOTE — Progress Notes (Signed)
09/14/2022 Name: Jeremiah Alexander MRN: [id number] DOB: 1970-11-07  Chief Complaint  Patient presents with   Medication Management   Diabetes   Hypertension   Hyperlipidemia    Jeremiah Alexander is a 52 y.o. year old male who presented for a telephone visit.   They were referred to the pharmacist by their PCP for assistance in managing diabetes, hypertension, and hyperlipidemia.   Patient is participating in a Managed Medicaid Plan:  Yes  Subjective:  Care Team: Primary Care Provider: Karoline Caldwell, MD ; Next Scheduled Visit: not scheduled  Rheumatology: Rice; Next Scheduled Visit:   Medication Access/Adherence  [facility]  [phone] [place] [place] - [address] [place] [address] Phone: [phone] Fax: [phone]  [id number] [address], [place] - [address] [place] [address] Phone: [phone] Fax: [phone]  [facility] [address] [place] [address] Phone: [phone] Fax: [phone]  [facility] [address] [place] [address] Phone: [phone] Fax: [phone]   - [facility] [address] [place] [address] Phone: [phone] Fax: [phone]   Patient reports affordability concerns with their medications: No  Patient reports access/transportation concerns to their pharmacy: No  Patient reports adherence concerns with their medications:  Yes    Reports confusion about what medications he is supposed to be taking.    Diabetes:  Current medications: Ozempic 0.25 mg weekly, Humalog 75/25 25 units twice daily; metformin 500 mg twice daily  Denies stomach upset, nausea, constipation since starting Ozempic.   Patient denies hypoglycemic s/sx including dizziness, shakiness, sweating.  Patient is not interested in CGM at this  time  Hypertension:  Current medications: lisinopril/HCTZ 20/12.5 mg daily  Patient does not have a validated, automated, upper arm home BP cuff but notes one has been ordered for him  Hyperlipidemia/ASCVD Risk Reduction  Current lipid lowering medications: none  Antiplatelet regimen: none due to Eliquis 5 mg BID for hx PE  ASCVD History: none Family History: none Risk Factors: inflammatory condition  PREVENT Risk Score: 10 year risk of CVD: 18.8% - 10 year risk of ASCVD: 10.7% - 10 year risk of HF: 15.9%  Risk calculated with 2016 lipid panel and most recent documented SBP of 173  Rheumatoid Arthritis: Current regimen: Enbrel 50 mg weekly; hydroxychloroquine 400 mg daily, prednisone 5 mg daily, also prescribed methotrexate 15 mg weekly   Notes he does not have methotrexate and was confused about what all to take  Depression/Anxiety :  Current medications: sertraline 25 mg daily  Denies significant benefit since starting medication ~ 4 weeks ago; however, denies any new stomach upset or other side effects  Objective:  Lab Results  Component Value Date   HGBA1C 5.7 (A) 08/07/2022    Lab Results  Component Value Date   CREATININE 0.82 08/14/2022   BUN 13 08/14/2022   NA 140 08/14/2022   K 4.4 08/14/2022   CL 104 08/14/2022   CO2 31 08/14/2022    Lab Results  Component Value Date   CHOL 197 04/22/2014   HDL 57 04/22/2014   LDLCALC 121 (H) 04/22/2014   TRIG 94 04/22/2014   CHOLHDL 2.6 07/21/2012    Medications Reviewed Today     Reviewed by Alden Hipp, RPH-CPP (Pharmacist) on 09/14/22 at 1004  Med List Status: <None>   Medication Order  Taking? Sig Documenting Provider Last Dose Status Informant  apixaban (ELIQUIS) 5 MG TABS tablet [id number] Yes Take 1 tablet (5 mg total) by mouth 2 (two) times daily. Chauncey Mann, DO Taking Active   blood glucose meter kit and supplies KIT [id number] Yes Use up to four times daily as directed. Zigmund Daniel., MD Taking Active   Blood Pressure Monitor DEVI [id number] Yes Use to check blood pressure daily. Hypertension I10.0 Atway, Rayann N, DO Taking Active   Etanercept (ENBREL MINI) 50 MG/ML SOCT [id number] Yes Inject 50 mg into the skin once a week. First dose completed in clinic with sample on 05/31/2022 Fuller Plan, MD Taking Active   Fingerstix Lancets MISC [id number] Yes use as directed to check blood sugars up to 4 times daily Zigmund Daniel., MD Taking Active   folic acid (FOLVITE) 1 MG tablet [id number] No Take 1 tablet (1 mg total) by mouth daily.  Patient not taking: Reported on 09/14/2022   Fuller Plan, MD Not Taking Active   glucose blood test strip [id number] Yes use as directed to check blood sugars up to 4 times daily Zigmund Daniel., MD Taking Active   hydroxychloroquine (PLAQUENIL) 200 MG tablet [id number] Yes Take 2 tablets (400 mg total) by mouth daily. Karoline Caldwell, MD Taking Active     Discontinued 02/23/22 1005 (Reorder)          Med Note Clearance Coots, Melany Wiesman T   Fri Sep 14, 2022 10:02 AM)    Insulin Lispro Prot & Lispro (HUMALOG 75/25 MIX) (75-25) 100 UNIT/ML Stephanie Coup [id number] Yes Inject 12 Units into the skin 2 (two) times daily with a meal. Atway, Rayann N, DO Taking Active            Med Note Clearance Coots, Solaris Kram T   Fri Sep 14, 2022 10:02 AM) 25 units twice daily  Insulin Pen Needle 31G X 8 MM MISC [id number] Yes Use as needed to inject insulin. Chauncey Mann, DO Taking Active   lisinopril-hydrochlorothiazide (ZESTORETIC) 20-12.5 MG tablet [id number] Yes Take 1 tablet by mouth daily. Chauncey Mann, DO Taking Active   metFORMIN (GLUCOPHAGE) 500 MG tablet [id number] Yes Take 1 tablet (500 mg total) by mouth 2 (two) times daily with a meal. Atway, Rayann N, DO Taking Active   methotrexate (RHEUMATREX) 2.5 MG tablet [id number] No Take 6 tablets (15 mg total) by mouth once a week. Caution:Chemotherapy. Protect from light.  Patient not taking:  Reported on 09/14/2022   Fuller Plan, MD Not Taking Active   predniSONE (DELTASONE) 5 MG tablet [id number] Yes Take 1 tablet (5 mg total) by mouth daily with breakfast. Mapp, Tavien, MD Taking Active   Semaglutide,0.25 or 0.5MG /DOS, 2 MG/3ML SOPN [id number] Yes Inject 0.25 mg into the skin once a week. Chauncey Mann, DO Taking Active   sertraline (ZOLOFT) 25 MG tablet [id number] Yes Take 1 tablet (25 mg total) by mouth daily. Chauncey Mann, DO Taking Active               Assessment/Plan:   Diabetes: - Currently controlled but with opportunity for optimization - Reviewed long term cardiovascular and renal outcomes of uncontrolled blood sugar - Reviewed goal A1c, goal fasting, and goal 2 hour post prandial glucose - Recommend to continue Ozempic 0.25 mg weekly and Humalog Mix; Encouraged to contact [facility] and schedule follow up with his primary care team for DM, BP, HLD. - Recommend to check glucose twice daily, fasting and  2 hour post prandial  Hypertension: - Currently unknown control now that he is back on therapy.  - Reviewed long term cardiovascular and renal outcomes of uncontrolled blood pressure - Reviewed appropriate blood pressure monitoring technique and reviewed goal blood pressure. Recommended to check home blood pressure and heart rate periodically - Recommend to continue current regimen at this time  Hyperlipidemia/ASCVD Risk Reduction: - Currently unknown control.  - Reviewed long term complications of uncontrolled cholesterol and discussed risk enhancing factors - Recommend to check updated lipid panel; would recommend at least moderate intensity statin due to diabetes, hypertension, and inflammatory condition  Rheumatoid Arthritis: - Patient confusion about treatment.  - Will collaborate with Pharmacy team to [facility]. Encouraged to continue Enbrel, hydroxychloroquine, prednisone, methotrexate, and folic acid per  Rheumatology. Follow up with Dr. Dimple Casey as scheduled.   Depression/Anxiety: - Currently uncontrolled - Recommend to follow up with PCP as above to discuss medication titration    Follow Up Plan: phone call in 6 weeks  Catie Eppie Gibson, PharmD, BCACP, CPP [facility] [phone]

## 2022-09-19 ENCOUNTER — Other Ambulatory Visit: Payer: Self-pay

## 2022-09-19 ENCOUNTER — Other Ambulatory Visit (HOSPITAL_COMMUNITY): Payer: Self-pay

## 2022-09-19 ENCOUNTER — Other Ambulatory Visit: Payer: Self-pay | Admitting: Internal Medicine

## 2022-09-19 DIAGNOSIS — M059 Rheumatoid arthritis with rheumatoid factor, unspecified: Secondary | ICD-10-CM

## 2022-09-19 DIAGNOSIS — Z79899 Other long term (current) drug therapy: Secondary | ICD-10-CM

## 2022-09-19 MED ORDER — ENBREL MINI 50 MG/ML ~~LOC~~ SOCT
50.0000 mg | SUBCUTANEOUS | 0 refills | Status: DC
  Filled 2022-09-19: qty 4, 28d supply, fill #0

## 2022-09-19 NOTE — Telephone Encounter (Signed)
Last Fill: 05/31/2022  Labs: 08/14/2022 Lab results look fine for continuing Enbrel and restarting the methotrexate as planned. His sedimentation rate is normal. So I am not sure how much of the continued knee pain and swelling is related to active RA versus existing wear and tear damage but hopefully the steroid injection would help for either problem.  TB Gold: 05/31/2022 Negative   Next Visit: 10/16/2022  Last Visit: 08/14/2022  MV:[id number] rheumatoid arthritis   Current Dose per office note 08/14/2022: Enbrel 50 mg subcu weekly   Okay to refill Enbrel?

## 2022-09-24 ENCOUNTER — Other Ambulatory Visit: Payer: Self-pay

## 2022-09-27 ENCOUNTER — Other Ambulatory Visit (HOSPITAL_COMMUNITY): Payer: Self-pay

## 2022-10-12 ENCOUNTER — Other Ambulatory Visit: Payer: Medicaid Other | Admitting: Pharmacist

## 2022-10-15 NOTE — Progress Notes (Unsigned)
Office Visit Note  Patient: Jeremiah Alexander             Date of Birth: Aug 20, 1970           MRN: [id number]             PCP: Karoline Caldwell, MD Referring: Karoline Caldwell, MD Visit Date: 10/16/2022   Subjective:  No chief complaint on file.   History of Present Illness: Jeremiah Alexander is a 52 y.o. male here for follow up ***   Previous HPI 08/14/22 Jeremiah Alexander is a 52 y.o. male here for follow up for seropositive RA on hydroxychloroquine 400 mg daily prednisone 5 mg daily after restarting Enbrel 50 mg subcu weekly after our visit in January.  He has seen an improvement in joint pain and stiffness symptoms.  Describes this as more relief than he has had on any previous treatment but still does not have him near his normal baseline. Knees still remain quite active and limiting his mobility. Symptoms are most severe involving his knees and in the right foot and ankle with some persistent swelling.  He has not noticed any serious side effects with the Enbrel injections.  He has had some increased achiness at the back of his scalp had some small scab or spots with excoriations but no visible rash.  Not seeing any rashes elsewhere.   Previous HPI 05/31/22 Jeremiah Alexander is a 52 y.o. male here for seropositive rheumatoid arthritis.  He is currently taking hydroxychloroquine 400 mg daily and prednisone 5 mg daily.  He was initially diagnosed with rheumatoid arthritis last year at [facility] in [place].  Initial symptom onset was since at least about 2 years ago with gradually progressing worsening pain and swelling and weakness affecting his right hand and wrist.  Then involving both sides and subsequently proceeded to have worsening pain and swelling affecting both of his knees and feet as well.  Currently knee pain and swelling is his most problematic symptom and has severely reduced his mobility.  Laboratory findings showed highly positive rheumatoid factor and a low positive anti-CCP  antibodies.  He was also started on methotrexate that he took for only a short time seems to have been discontinued just due to lack of refill or follow-up rather than from any particular intolerance or contraindication.  However never had dramatic symptomatic improvement.  He was also recommended to start Enbrel but initially this was stopped after losing his medical insurance. He was hospitalized with acute pulmonary embolism last year thought most likely coming from his uncontrolled inflammatory arthritis and immobility with no previous history of blood clots or coagulation disorder.  He is now on indefinite Eliquis anticoagulation. He continues to smoke cigarettes on some days with >20 year total history.   Labs reviewed RF 187 CCP 39   DMARD Hx Prednisone - current Methotrexate - Short duration in 2023 unintentionally stopped Plaquenil - current Enbrel - planned   No Rheumatology ROS completed.   PMFS History:  Patient Active Problem List   Diagnosis Date Noted   Other bilateral secondary osteoarthritis of knee 08/14/2022   Depression 08/07/2022   High risk medication use 05/31/2022   BP (high blood pressure) 05/31/2022   Compulsive tobacco user syndrome 05/31/2022   HLD (hyperlipidemia) 05/31/2022   Infection of the upper respiratory tract 05/31/2022   Seropositive rheumatoid arthritis (HCC) 03/09/2022   Preventative health care 03/09/2022   Pulmonary embolism without acute cor pulmonale (HCC) 02/22/2022   Pulmonary  embolism (HCC) 02/22/2022   TOBACCO ABUSE 06/05/2010   Diabetes (HCC) 05/08/2010   OBESITY 05/08/2010   HYPERTENSION, BENIGN ESSENTIAL 05/08/2010    Past Medical History:  Diagnosis Date   Diabetes mellitus without complication (HCC)    High cholesterol    Hypertension     Family History  Problem Relation Age of Onset   Rheum arthritis Mother    Diabetes Other    Past Surgical History:  Procedure Laterality Date   TOE AMPUTATION     Right Pinky Toe    Social History   Social History Narrative   Not on file   Immunization History  Administered Date(s) Administered   Influenza Whole 04/12/2010   Tdap 03/09/2022     Objective: Vital Signs: There were no vitals taken for this visit.   Physical Exam   Musculoskeletal Exam: ***  CDAI Exam: CDAI Score: -- Patient Global: --; Provider Global: -- Swollen: --; Tender: -- Joint Exam 10/16/2022   No joint exam has been documented for this visit   There is currently no information documented on the homunculus. Go to the Rheumatology activity and complete the homunculus joint exam.  Investigation: No additional findings.  Imaging: No results found.  Recent Labs: Lab Results  Component Value Date   WBC 7.5 08/14/2022   HGB 14.1 08/14/2022   PLT 332 08/14/2022   NA 140 08/14/2022   K 4.4 08/14/2022   CL 104 08/14/2022   CO2 31 08/14/2022   GLUCOSE 104 (H) 08/14/2022   BUN 13 08/14/2022   CREATININE 0.82 08/14/2022   BILITOT 0.5 08/14/2022   ALKPHOS 89 07/21/2012   AST 9 (L) 08/14/2022   ALT 9 08/14/2022   PROT 7.2 08/14/2022   ALBUMIN 3.6 04/22/2014   CALCIUM 9.3 08/14/2022   GFRAA >60 10/12/2019   QFTBGOLDPLUS NEGATIVE 05/31/2022    Speciality Comments: Enbrel started 05/31/2022  Procedures:  No procedures performed Allergies: Patient has no known allergies.   Assessment / Plan:     Visit Diagnoses: No diagnosis found.  ***  Orders: No orders of the defined types were placed in this encounter.  No orders of the defined types were placed in this encounter.    Follow-Up Instructions: No follow-ups on file.   Fuller Plan, MD  Note - This record has been created using [employer].  Chart creation errors have been sought, but may not always  have been located. Such creation errors do not reflect on  the standard of medical care.

## 2022-10-16 ENCOUNTER — Other Ambulatory Visit (HOSPITAL_COMMUNITY): Payer: Self-pay

## 2022-10-16 ENCOUNTER — Encounter: Payer: Self-pay | Admitting: Internal Medicine

## 2022-10-16 ENCOUNTER — Ambulatory Visit: Payer: Medicaid Other | Attending: Internal Medicine | Admitting: Internal Medicine

## 2022-10-16 VITALS — BP 130/89 | HR 73 | Resp 14 | Ht 72.0 in | Wt 279.0 lb

## 2022-10-16 DIAGNOSIS — M25572 Pain in left ankle and joints of left foot: Secondary | ICD-10-CM | POA: Diagnosis not present

## 2022-10-16 DIAGNOSIS — Z79899 Other long term (current) drug therapy: Secondary | ICD-10-CM | POA: Diagnosis not present

## 2022-10-16 DIAGNOSIS — M059 Rheumatoid arthritis with rheumatoid factor, unspecified: Secondary | ICD-10-CM | POA: Diagnosis not present

## 2022-10-16 LAB — CBC WITH DIFFERENTIAL/PLATELET
Absolute Monocytes: 745 cells/uL (ref 200–950)
MCH: 29 pg (ref 27.0–33.0)
MCV: 88.7 fL (ref 80.0–100.0)
MPV: 9.7 fL (ref 7.5–12.5)
Monocytes Relative: 8.1 %
Neutro Abs: 5161 cells/uL (ref 1500–7800)
Total Lymphocyte: 26.4 %

## 2022-10-17 ENCOUNTER — Other Ambulatory Visit (HOSPITAL_COMMUNITY): Payer: Self-pay

## 2022-10-17 ENCOUNTER — Other Ambulatory Visit: Payer: Self-pay

## 2022-10-17 ENCOUNTER — Other Ambulatory Visit: Payer: Self-pay | Admitting: Internal Medicine

## 2022-10-17 DIAGNOSIS — M059 Rheumatoid arthritis with rheumatoid factor, unspecified: Secondary | ICD-10-CM

## 2022-10-17 DIAGNOSIS — Z79899 Other long term (current) drug therapy: Secondary | ICD-10-CM

## 2022-10-17 LAB — COMPLETE METABOLIC PANEL WITH GFR
AG Ratio: 1.3 (calc) (ref 1.0–2.5)
ALT: 10 U/L (ref 9–46)
AST: 10 U/L (ref 10–35)
Albumin: 3.6 g/dL (ref 3.6–5.1)
Alkaline phosphatase (APISO): 112 U/L (ref 35–144)
BUN/Creatinine Ratio: 15 (calc) (ref 6–22)
BUN: 23 mg/dL (ref 7–25)
CO2: 29 mmol/L (ref 20–32)
Calcium: 9 mg/dL (ref 8.6–10.3)
Chloride: 106 mmol/L (ref 98–110)
Creat: 1.51 mg/dL — ABNORMAL HIGH (ref 0.70–1.30)
Globulin: 2.7 g/dL (calc) (ref 1.9–3.7)
Glucose, Bld: 104 mg/dL — ABNORMAL HIGH (ref 65–99)
Potassium: 4.2 mmol/L (ref 3.5–5.3)
Sodium: 142 mmol/L (ref 135–146)
Total Bilirubin: 0.3 mg/dL (ref 0.2–1.2)
Total Protein: 6.3 g/dL (ref 6.1–8.1)
eGFR: 55 mL/min/{1.73_m2} — ABNORMAL LOW (ref >=60)

## 2022-10-17 LAB — CBC WITH DIFFERENTIAL/PLATELET
Basophils Absolute: 64 cells/uL (ref 0–200)
Basophils Relative: 0.7 %
Eosinophils Absolute: 800 cells/uL — ABNORMAL HIGH (ref 15–500)
Eosinophils Relative: 8.7 %
HCT: 38.5 % (ref 38.5–50.0)
Hemoglobin: 12.6 g/dL — ABNORMAL LOW (ref 13.2–17.1)
Lymphs Abs: 2429 cells/uL (ref 850–3900)
MCHC: 32.7 g/dL (ref 32.0–36.0)
Neutrophils Relative %: 56.1 %
Platelets: 268 10*3/uL (ref 140–400)
RBC: 4.34 10*6/uL (ref 4.20–5.80)
RDW: 13.2 % (ref 11.0–15.0)
WBC: 9.2 10*3/uL (ref 3.8–10.8)

## 2022-10-17 LAB — C-REACTIVE PROTEIN: CRP: 9.2 mg/L — ABNORMAL HIGH (ref <8.0)

## 2022-10-17 LAB — SEDIMENTATION RATE: Sed Rate: 19 mm/h (ref 0–20)

## 2022-10-17 MED ORDER — PREDNISONE 5 MG PO TABS
ORAL_TABLET | ORAL | 0 refills | Status: AC
Start: 2022-10-17 — End: 2022-10-26
  Filled 2022-10-17 (×2): qty 27, 9d supply, fill #0

## 2022-10-17 MED ORDER — ENBREL MINI 50 MG/ML ~~LOC~~ SOCT
50.0000 mg | SUBCUTANEOUS | 0 refills | Status: DC
  Filled 2022-10-17: qty 4, 28d supply, fill #0
  Filled 2022-11-14: qty 4, 28d supply, fill #1
  Filled 2022-12-17: qty 4, 28d supply, fill #2

## 2022-10-17 NOTE — Telephone Encounter (Signed)
Last Fill: 09/19/2022  Labs: 10/16/2022 CRP is mildly elevated at 9.2 and eosinophils are increased at 800 this may indicate some increased inflammation. I recommend we try initially a course of increased prednisone dose for the next several days. And do try to restart the methotrexate if he is able to get the medicine.   TB Gold: 05/31/2022  NEGATIVE   Next Visit: 01/17/2023  Last Visit: 10/16/2022  VW:[id number] rheumatoid arthritis   Current Dose per office note 10/16/2022: Enbrel 50 mg subcu weekly   Okay to refill Enbrel?

## 2022-10-17 NOTE — Progress Notes (Signed)
CRP is mildly elevated at 9.2 and eosinophils are increased at 800 this may indicate some increased inflammation. I recommend we try initially a course of increased prednisone dose for the next several days. And do try to restart the methotrexate if he is able to get the medicine.

## 2022-10-17 NOTE — Addendum Note (Signed)
Addended by: Fuller Plan on: 10/17/2022 08:03 AM   Modules accepted: Orders

## 2022-10-18 ENCOUNTER — Other Ambulatory Visit (HOSPITAL_COMMUNITY): Payer: Self-pay

## 2022-10-19 ENCOUNTER — Other Ambulatory Visit (HOSPITAL_COMMUNITY): Payer: Self-pay

## 2022-10-22 ENCOUNTER — Encounter: Payer: Self-pay | Admitting: Pharmacist

## 2022-10-22 ENCOUNTER — Other Ambulatory Visit: Payer: Medicaid Other | Admitting: Pharmacist

## 2022-10-22 NOTE — Progress Notes (Signed)
10/22/2022 Name: Jeremiah Alexander MRN: [id number] DOB: 07/27/1970  Chief Complaint  Patient presents with   Medication Management   Hypertension   Diabetes    Jeremiah Alexander is a 52 y.o. year old male who presented for a telephone visit.   They were referred to the pharmacist by their PCP for assistance in managing diabetes, hypertension, and hyperlipidemia.   Patient is participating in a Managed Medicaid Plan:  Yes  Subjective:  Care Team: Primary Care Provider: Karoline Caldwell, MD ; Next Scheduled Visit: 10/26/22  Medication Access/Adherence  [facility]  [phone] - [place] - [address] [place] [address] Phone: [phone] Fax: [phone]  [facility] [address] [place] [address] Phone: [phone] Fax: [phone]   Patient reports affordability concerns with their medications: No  Patient reports access/transportation concerns to their pharmacy: No  Patient reports adherence concerns with their medications:  No     Diabetes:  Current medications: Ozempic 0.25 mg weekly, Humalog 75/25 mg ~20 units twice Alexander; metformin 500 mg twice Alexander   Current glucose readings: low 100s usually  Patient denies hypoglycemic s/sx including dizziness, shakiness, sweating. Patient denies hyperglycemic symptoms including polyuria, polydipsia, polyphagia, nocturia, neuropathy, blurred vision.  Hypertension:  Current medications: lisinopril/HCTZ 20/12.5 mg Alexander  Patient has a validated, automated, upper arm home BP cuff Current blood pressure readings readings: has not been checking  Patient denies hypotensive s/sx including dizziness, lightheadedness.  Patient denies hypertensive symptoms including headache, chest pain, shortness of breath  Hyperlipidemia/ASCVD Risk Reduction  Current lipid lowering medications: none  PREVENT Risk Score: 10 year risk of CVD:  11.6% - 10 year risk of ASCVD: 6.2% - 10 year risk of HF: 9.5%    Objective:  Lab Results  Component Value Date   HGBA1C 5.7 (A) 08/07/2022    Lab Results  Component Value Date   CREATININE 1.51 (H) 10/16/2022   BUN 23 10/16/2022   NA 142 10/16/2022   K 4.2 10/16/2022   CL 106 10/16/2022   CO2 29 10/16/2022    Lab Results  Component Value Date   CHOL 197 04/22/2014   HDL 57 04/22/2014   LDLCALC 121 (H) 04/22/2014   TRIG 94 04/22/2014   CHOLHDL 2.6 07/21/2012    Medications Reviewed Today     Reviewed by Alden Hipp, RPH-CPP (Pharmacist) on 10/22/22 at 0935  Med List Status: <None>   Medication Order Taking? Sig Documenting Provider Last Dose Status Informant  apixaban (ELIQUIS) 5 MG TABS tablet [id number] Yes Take 1 tablet (5 mg total) by mouth 2 (two) times Alexander. Chauncey Mann, DO Taking Active   blood glucose meter kit and supplies KIT [id number]  Use up to four times Alexander as directed. Zigmund Daniel., MD  Active   Blood Pressure Monitor DEVI [id number]  Use to check blood pressure Alexander. Hypertension I10.0 Atway, Rayann N, DO  Active   Etanercept (ENBREL MINI) 50 MG/ML SOCT [id number] Yes Inject 50 mg into the skin once a week. Fuller Plan, MD Taking Active   Fingerstix Lancets MISC [id number]  use as directed to check blood sugars up to 4 times Alexander Zigmund Daniel., MD  Active   folic acid (FOLVITE) 1 MG tablet [id number] Yes Take 1 tablet (1 mg total) by mouth Alexander. Fuller Plan, MD Taking Active   glucose blood test strip [id number]  use  as directed to check blood sugars up to 4 times Alexander Zigmund Daniel., MD  Active   hydroxychloroquine (PLAQUENIL) 200 MG tablet [id number] Yes Take 2 tablets (400 mg total) by mouth Alexander. Fuller Plan, MD Taking Active     Discontinued 02/23/22 1005 (Reorder)          Med Note Clearance Coots, Walid Haig T   Fri Sep 14, 2022 10:02 AM)    Insulin Lispro Prot & Lispro (HUMALOG 75/25 MIX)  (75-25) 100 UNIT/ML Stephanie Coup [id number] Yes Inject 12 Units into the skin 2 (two) times Alexander with a meal. Atway, Rayann N, DO Taking Active            Med Note Clearance Coots, Jaydalyn Demattia T   Mon Oct 22, 2022  9:35 AM) 20 units twice Alexander  Insulin Pen Needle 31G X 8 MM MISC [id number] Yes Use as needed to inject insulin. Chauncey Mann, DO Taking Active   lisinopril-hydrochlorothiazide (ZESTORETIC) 20-12.5 MG tablet [id number] Yes Take 1 tablet by mouth Alexander. Chauncey Mann, DO Taking Active   metFORMIN (GLUCOPHAGE) 500 MG tablet [id number] Yes Take 1 tablet (500 mg total) by mouth 2 (two) times Alexander with a meal. Atway, Rayann N, DO Taking Active   methotrexate (RHEUMATREX) 2.5 MG tablet [id number] Yes Take 6 tablets (15 mg total) by mouth once a week. Caution:Chemotherapy. Protect from light. Fuller Plan, MD Taking Active   predniSONE (DELTASONE) 5 MG tablet [id number] No Take 1 tablet (5 mg total) by mouth Alexander with breakfast.  Patient not taking: Reported on 10/22/2022   Karoline Caldwell, MD Not Taking Active   predniSONE (DELTASONE) 5 MG tablet [id number] Yes Take 4 tablets (20 mg total) by mouth Alexander with breakfast for 3 days, THEN 3 tablets (15 mg total) Alexander with breakfast for 3 days, THEN 2 tablets (10 mg total) Alexander with breakfast for 3 days. Fuller Plan, MD Taking Active   Semaglutide,0.25 or 0.5MG /DOS, 2 MG/3ML Namon Cirri [id number] Yes Inject 0.25 mg into the skin once a week. Chauncey Mann, DO Taking Active   sertraline (ZOLOFT) 25 MG tablet [id number] Yes Take 1 tablet (25 mg total) by mouth Alexander. Chauncey Mann, DO Taking Active               Assessment/Plan:   Diabetes: - Currently controlled but with opportunities for further optimization - Recommend to continue current regimen. Recommend to continue Ozempic titration and reduce insulin dose at upcoming appointment.  - Recommend to check glucose twice Alexander, fasting and two hour post prandial - Could also consider SGLT2i in  the future for nephroprotection. Check UACR with upcoming visit   Hypertension: - Currently controlled - Reviewed long term cardiovascular and renal outcomes of uncontrolled blood pressure - Reviewed appropriate blood pressure monitoring technique and reviewed goal blood pressure. Recommended to check home blood pressure and heart rate periodically. Discussed that if continued Ozempic titration/insulin reduction results in weight loss, this could lower blood pressure and he could experience hypotension. Monitor at home - Recommend to continue current regimen at this time  Hyperlipidemia/ASCVD Risk Reduction: - Currently untreated. Recommend to check lipids at upcoming visit. Given ASCVD risk score, combined with risk enhancing factors of hypertension, diabetes, tobacco abuse, and inflammatory condition, recommend initiation of moderate intensity statin.    Follow Up Plan: phone call in 6 weeks  Catie TClearance Coots, PharmD, BCACP, CPP [facility] [phone]

## 2022-10-26 ENCOUNTER — Other Ambulatory Visit (HOSPITAL_COMMUNITY): Payer: Self-pay

## 2022-10-26 ENCOUNTER — Ambulatory Visit (INDEPENDENT_AMBULATORY_CARE_PROVIDER_SITE_OTHER): Payer: Medicaid Other

## 2022-10-26 VITALS — BP 155/100 | HR 70 | Ht 72.0 in | Wt 282.1 lb

## 2022-10-26 DIAGNOSIS — Z139 Encounter for screening, unspecified: Secondary | ICD-10-CM

## 2022-10-26 DIAGNOSIS — I10 Essential (primary) hypertension: Secondary | ICD-10-CM | POA: Diagnosis not present

## 2022-10-26 DIAGNOSIS — E1142 Type 2 diabetes mellitus with diabetic polyneuropathy: Secondary | ICD-10-CM | POA: Diagnosis not present

## 2022-10-26 DIAGNOSIS — Z794 Long term (current) use of insulin: Secondary | ICD-10-CM | POA: Diagnosis not present

## 2022-10-26 DIAGNOSIS — Z7984 Long term (current) use of oral hypoglycemic drugs: Secondary | ICD-10-CM

## 2022-10-26 DIAGNOSIS — K219 Gastro-esophageal reflux disease without esophagitis: Secondary | ICD-10-CM | POA: Insufficient documentation

## 2022-10-26 DIAGNOSIS — Z0000 Encounter for general adult medical examination without abnormal findings: Secondary | ICD-10-CM

## 2022-10-26 MED ORDER — PANTOPRAZOLE SODIUM 40 MG PO TBEC
40.0000 mg | DELAYED_RELEASE_TABLET | Freq: Every day | ORAL | 1 refills | Status: AC
  Filled 2022-10-26 – 2023-03-25 (×2): qty 30, 30d supply, fill #0
  Filled 2023-05-16: qty 30, 30d supply, fill #1

## 2022-10-26 MED ORDER — LISINOPRIL-HYDROCHLOROTHIAZIDE 20-12.5 MG PO TABS
1.0000 | ORAL_TABLET | Freq: Every day | ORAL | 1 refills | Status: DC
  Filled 2022-10-26 – 2023-03-20 (×2): qty 90, 90d supply, fill #0

## 2022-10-26 NOTE — Assessment & Plan Note (Signed)
Current medications include metFORMIN 500 MG BID, humalog (Insulin Lispro Prot & Lispro (75-25)) 12 units BID w/ meals, Ozempic 0.25 mg weekly. Patient states that they are compliant with these medications. Patient does check their blood sugar at home regularly and notes values in the 100s in the mornings. Denies having lows or values in the 200s. Patient denies polyuria, polydipsia, fatigue. Patient states that they do visit the ophthalmologist for yearly eye exams. A1c was 5.7% 2 months ago.  Plan: - Continue metFORMIN 500 MG BID, humalog (Insulin Lispro Prot & Lispro (75-25)) 12 units BID w/ meals, Ozempic 0.25 mg weekly - Repeat A1c in 1 month

## 2022-10-26 NOTE — Assessment & Plan Note (Signed)
Current medications include lisinopril-hydrochlorothiazide 20-12.5 MG daily. Patient states that they are compliant with this medication but ran out of this medication a few days ago. Patient states that they do not check their BP regularly at home. Patient denies HA, lightheadedness, dizziness, CP, or SOB. Initial BP today is 155/100. Will recheck BP at next visit with consistent lisinopril-HCTZ use. Consider adding amlodipine to his regimen if BP remains elevated.   Plan: - Continue lisinopril-hydrochlorothiazide 20-12.5 MG daily - Consider adding amlodipine at next visit if BP remains elevated - BMP today

## 2022-10-26 NOTE — Assessment & Plan Note (Signed)
Referral for colonoscopy ordered.

## 2022-10-26 NOTE — Assessment & Plan Note (Signed)
Patient reports chronic intermittent burning chest pain over the last several years. He reports that this pain worsens after eating food and when laying down at night. He reports having a sour taste in his mouth when the pain occurs. Denies exertional chest pain. He is not actively having chest pain. Suspect his symptoms are likely secondary to acid reflux. Will start PPI and reassess symptoms at his next appointment.   Plan: - Start Pantoprazole 40 mg daily

## 2022-10-26 NOTE — Patient Instructions (Addendum)
Thank you for coming to see [place] in clinic Jeremiah Alexander.  Plan:  - Please start taking:     - Pantoprazole 40 mg daily for your burning chest pain  - Please continue taking:     - Lisinopril-hydrochlorothiazide 20-12.5 MG daily for high blood pressure  - We got the following labs today and will call you with the results:     - BMP to check your kidney function and electrolytes  - We referred you to see the following providers (you will be called to schedule an appointment):     - Gastroenterologist (stomach doctor) for a colonoscopy  It was very nice to see you, thank you for allowing [place] to be involved in your care. We look forward to seeing you next time. Please call our clinic at [phone] if you have any questions or concerns. The best time to call is Monday-Friday from 9am-4pm, but there is someone available 24/7. If after hours or the weekend, call the main hospital number at [phone] and ask for the Internal Medicine Resident On-Call. If you need medication refills, please notify your pharmacy one week in advance and they will send [place] a request.   Please make sure to arrive 15 minutes prior to your next appointment. If you arrive late, you may be asked to reschedule.

## 2022-10-26 NOTE — Progress Notes (Signed)
CC: T2DM f/u  HPI:  Jeremiah Alexander is a 52 y.o. male with past medical history of HTN, HLD, T2DM, PE (02/2022), RA, oA, obesity, depression, tobacco use that presents for f/u of T2DM.    Allergies as of 10/26/2022   No Known Allergies      Medication List        Accurate as of October 26, 2022  9:44 AM. If you have any questions, ask your nurse or doctor.          Blood Pressure Monitor Devi Use to check blood pressure daily. Hypertension I10.0   Eliquis 5 MG Tabs tablet Generic drug: apixaban Take 1 tablet (5 mg total) by mouth 2 (two) times daily.   Enbrel Mini 50 MG/ML Soct Generic drug: Etanercept Inject 50 mg into the skin once a week.   folic acid 1 MG tablet Commonly known as: FOLVITE Take 1 tablet (1 mg total) by mouth daily.   hydroxychloroquine 200 MG tablet Commonly known as: PLAQUENIL Take 2 tablets (400 mg total) by mouth daily.   Insulin Lispro Prot & Lispro (75-25) 100 UNIT/ML Kwikpen Commonly known as: HUMALOG 75/25 MIX Inject 12 Units into the skin 2 (two) times daily with a meal.   lisinopril-hydrochlorothiazide 20-12.5 MG tablet Commonly known as: Zestoretic Take 1 tablet by mouth daily.   metFORMIN 500 MG tablet Commonly known as: GLUCOPHAGE Take 1 tablet (500 mg total) by mouth 2 (two) times daily with a meal.   methotrexate 2.5 MG tablet Commonly known as: RHEUMATREX Take 6 tablets (15 mg total) by mouth once a week. Caution:Chemotherapy. Protect from light.   OneTouch Delica Plus Lancet30G Misc use as directed to check blood sugars up to 4 times daily   OneTouch Verio Flex System w/Device Kit Use up to four times daily as directed.   OneTouch Verio test strip Generic drug: glucose blood use as directed to check blood sugars up to 4 times daily   Ozempic (0.25 or 0.5 MG/DOSE) 2 MG/3ML Sopn Generic drug: Semaglutide(0.25 or 0.5MG /DOS) Inject 0.25 mg into the skin once a week.   pantoprazole 40 MG tablet Commonly known as:  Protonix Take 1 tablet (40 mg total) by mouth daily. Started by: Karoline Caldwell, MD   predniSONE 5 MG tablet Commonly known as: DELTASONE Take 1 tablet (5 mg total) by mouth daily with breakfast.   predniSONE 5 MG tablet Commonly known as: DELTASONE Take 4 tablets (20 mg total) by mouth daily with breakfast for 3 days, THEN 3 tablets (15 mg total) daily with breakfast for 3 days, THEN 2 tablets (10 mg total) daily with breakfast for 3 days. Start taking on: Oct 17, 2022   sertraline 25 MG tablet Commonly known as: Zoloft Take 1 tablet (25 mg total) by mouth daily.   TechLite Pen Needles 31G X 8 MM Misc Generic drug: Insulin Pen Needle Use as needed to inject insulin.         Past Medical History:  Diagnosis Date   Diabetes mellitus without complication (HCC)    High cholesterol    Hypertension    Review of Systems:  per HPI.   Physical Exam: Vitals:   10/26/22 0905  BP: (!) 155/100  Pulse: 70  SpO2: 100%  Weight: 282 lb 1.6 oz (128 kg)  Height: 6' (1.829 m)   Constitutional: Well-developed, well-nourished, appears comfortable  HENT: Normocephalic and atraumatic.  Eyes: EOM are normal. Neck: Normal range of motion.  Cardiovascular: Regular rate, regular rhythm. No  murmurs, rubs, or gallops. Normal radial and PT pulses bilaterally. No LE edema.  Pulmonary: Normal respiratory effort. No wheezes, rales, or rhonchi.   Abdominal: Soft. Non-distended. No tenderness. Normal bowel sounds.  Musculoskeletal: Normal gait.   Neurological: Alert and oriented to person, place, and time. Non-focal. Skin: warm and dry.    Assessment & Plan:   See Encounters Tab for problem based charting.  Diabetes (HCC) Current medications include metFORMIN 500 MG BID, humalog (Insulin Lispro Prot & Lispro (75-25)) 12 units BID w/ meals, Ozempic 0.25 mg weekly. Patient states that they are compliant with these medications. Patient does check their blood sugar at home regularly and notes values  in the 100s in the mornings. Denies having lows or values in the 200s. Patient denies polyuria, polydipsia, fatigue. Patient states that they do visit the ophthalmologist for yearly eye exams. A1c was 5.7% 2 months ago.  Plan: - Continue metFORMIN 500 MG BID, humalog (Insulin Lispro Prot & Lispro (75-25)) 12 units BID w/ meals, Ozempic 0.25 mg weekly - Repeat A1c in 1 month  HYPERTENSION, BENIGN ESSENTIAL Current medications include lisinopril-hydrochlorothiazide 20-12.5 MG daily. Patient states that they are compliant with this medication but ran out of this medication a few days ago. Patient states that they do not check their BP regularly at home. Patient denies HA, lightheadedness, dizziness, CP, or SOB. Initial BP today is 155/100. Will recheck BP at next visit with consistent lisinopril-HCTZ use. Consider adding amlodipine to his regimen if BP remains elevated.   Plan: - Continue lisinopril-hydrochlorothiazide 20-12.5 MG daily - Consider adding amlodipine at next visit if BP remains elevated - BMP today  Acid reflux Patient reports chronic intermittent burning chest pain over the last several years. He reports that this pain worsens after eating food and when laying down at night. He reports having a sour taste in his mouth when the pain occurs. Denies exertional chest pain. He is not actively having chest pain. Suspect his symptoms are likely secondary to acid reflux. Will start PPI and reassess symptoms at his next appointment.   Plan: - Start Pantoprazole 40 mg daily  Preventative health care Referral for colonoscopy ordered.    Patient seen with Dr. Oswaldo Done

## 2022-10-27 LAB — BMP8+ANION GAP
Anion Gap: 14 mmol/L (ref 10.0–18.0)
BUN/Creatinine Ratio: 11 (ref 9–20)
BUN: 10 mg/dL (ref 6–24)
CO2: 23 mmol/L (ref 20–29)
Calcium: 9.2 mg/dL (ref 8.7–10.2)
Chloride: 103 mmol/L (ref 96–106)
Creatinine, Ser: 0.87 mg/dL (ref 0.76–1.27)
Glucose: 108 mg/dL — ABNORMAL HIGH (ref 70–99)
Potassium: 4.1 mmol/L (ref 3.5–5.2)
Sodium: 140 mmol/L (ref 134–144)
eGFR: 103 mL/min/{1.73_m2} (ref >59)

## 2022-10-29 NOTE — Progress Notes (Signed)
Internal Medicine Clinic Attending  Case discussed with Dr. Mapp  At the time of the visit.  We reviewed the resident's history and exam and pertinent patient test results.  I agree with the assessment, diagnosis, and plan of care documented in the resident's note.  

## 2022-11-05 ENCOUNTER — Other Ambulatory Visit: Payer: Self-pay

## 2022-11-07 ENCOUNTER — Other Ambulatory Visit (HOSPITAL_COMMUNITY): Payer: Self-pay

## 2022-11-12 ENCOUNTER — Other Ambulatory Visit: Payer: Self-pay

## 2022-11-14 ENCOUNTER — Other Ambulatory Visit (HOSPITAL_COMMUNITY): Payer: Self-pay

## 2022-11-16 ENCOUNTER — Other Ambulatory Visit (HOSPITAL_COMMUNITY): Payer: Self-pay

## 2022-12-03 ENCOUNTER — Encounter: Payer: Self-pay | Admitting: Gastroenterology

## 2022-12-11 ENCOUNTER — Other Ambulatory Visit: Payer: Medicaid Other | Admitting: Pharmacist

## 2022-12-11 ENCOUNTER — Other Ambulatory Visit (HOSPITAL_COMMUNITY): Payer: Self-pay

## 2022-12-11 NOTE — Progress Notes (Signed)
12/11/2022 Name: Jeremiah Alexander MRN: [id number] DOB: January 27, 1971  Chief Complaint  Patient presents with   Medication Management   Diabetes   Hypertension   Hyperlipidemia    Jeremiah Alexander is a 52 y.o. year old male who presented for a telephone visit.   They were referred to the pharmacist by their Case Management Team  for assistance in managing diabetes and hypertension.    Subjective:  Care Team: Primary Care Provider: Kathleen Lime, MD ; Next Scheduled Visit: due to schedule Rheumatology: [place]; Next Scheduled Appointment:  01/17/23  Medication Access/Adherence  Current Pharmacy:  [facility] [phone] - Closed - [place], [place] - [address] [place] [address] Phone: [phone] Fax: [phone]  [place] [facility] [address] [place] [address] Phone: [phone] Fax: [phone]   Patient reports affordability concerns with their medications: No  Patient reports access/transportation concerns to their pharmacy: No  Patient reports adherence concerns with their medications:  No     Diabetes:  Current medications: metformin 500 mg twice daily, Ozempic 0.25 mg weekly, Humalog 75/25 20 units twice daily  Denies any GI upset with Ozempic  Current glucose readings: reports readings remain well controlled <130 fasting  Patient denies hypoglycemic s/sx including dizziness, shakiness, sweating. Patient denies hyperglycemic symptoms including polyuria, polydipsia, polyphagia, nocturia, neuropathy, blurred vision.  Hypertension:  Current medications: lisinopril/hydrochlorothiazide 20/12.5 mg daily  Patient has a validated, automated, upper arm home BP cuff  Patient denies hypotensive s/sx including dizziness, lightheadedness.  Patient denies hypertensive symptoms including headache, chest pain, shortness of breath  Hyperlipidemia/ASCVD Risk Reduction   Current lipid lowering medications: none    PREVENT Risk Score: 10 year risk of CVD: 11.6% - 10 year risk of ASCVD: 6.2% - 10 year risk of HF: 9.5%   Objective:  Lab Results  Component Value Date   HGBA1C 5.7 (A) 08/07/2022    Lab Results  Component Value Date   CREATININE 0.87 10/26/2022   BUN 10 10/26/2022   NA 140 10/26/2022   K 4.1 10/26/2022   CL 103 10/26/2022   CO2 23 10/26/2022    Lab Results  Component Value Date   CHOL 197 04/22/2014   HDL 57 04/22/2014   LDLCALC 121 (H) 04/22/2014   TRIG 94 04/22/2014   CHOLHDL 2.6 07/21/2012    Medications Reviewed Today     Reviewed by Karoline Caldwell, MD (Resident) on 10/26/22 at 0945  Med List Status: <None>   Medication Order Taking? Sig Documenting Provider Last Dose Status Informant  apixaban (ELIQUIS) 5 MG TABS tablet [id number] No Take 1 tablet (5 mg total) by mouth 2 (two) times daily. Chauncey Mann, DO Taking Active   blood glucose meter kit and supplies KIT [id number] No Use up to four times daily as directed. Zigmund Daniel., MD Taking Active   Blood Pressure Monitor DEVI [id number] No Use to check blood pressure daily. Hypertension I10.0 Atway, Rayann N, DO Taking Active   Etanercept (ENBREL MINI) 50 MG/ML SOCT [id number] No Inject 50 mg into the skin once a week. Fuller Plan, MD Taking Active   Fingerstix Lancets MISC [id number] No use as directed to check blood sugars up to 4 times daily Zigmund Daniel., MD Taking Active   folic acid (FOLVITE) 1 MG tablet [id number] No Take 1 tablet (1 mg total) by mouth daily. Fuller Plan, MD Taking Active   glucose blood test strip  [id number] No use as directed to check blood sugars up to 4 times daily Zigmund Daniel., MD Taking Active   hydroxychloroquine (PLAQUENIL) 200 MG tablet [id number] No Take 2 tablets (400 mg total) by mouth daily. Fuller Plan, MD Taking Active     Discontinued 02/23/22 1005 (Reorder)          Med Note Clearance Coots, Kamee Bobst T   Fri Sep 14, 2022 10:02 AM)     Insulin Lispro Prot & Lispro (HUMALOG 75/25 MIX) (75-25) 100 UNIT/ML Stephanie Coup [id number] No Inject 12 Units into the skin 2 (two) times daily with a meal. Atway, Rayann N, DO Taking Active            Med Note Clearance Coots, Kowen Kluth T   Mon Oct 22, 2022  9:35 AM) 20 units twice daily  Insulin Pen Needle 31G X 8 MM MISC [id number] No Use as needed to inject insulin. Chauncey Mann, DO Taking Active   lisinopril-hydrochlorothiazide (ZESTORETIC) 20-12.5 MG tablet [id number]  Take 1 tablet by mouth daily. Mapp, Gaylyn Cheers, MD  Active   metFORMIN (GLUCOPHAGE) 500 MG tablet [id number] No Take 1 tablet (500 mg total) by mouth 2 (two) times daily with a meal. Atway, Rayann N, DO Taking Active   methotrexate (RHEUMATREX) 2.5 MG tablet [id number] No Take 6 tablets (15 mg total) by mouth once a week. Caution:Chemotherapy. Protect from light. Fuller Plan, MD Taking Active   pantoprazole (PROTONIX) 40 MG tablet [id number] Yes Take 1 tablet (40 mg total) by mouth daily. Mapp, Gaylyn Cheers, MD  Active   predniSONE (DELTASONE) 5 MG tablet [id number] No Take 1 tablet (5 mg total) by mouth daily with breakfast.  Patient not taking: Reported on 10/22/2022   Karoline Caldwell, MD Not Taking Active   predniSONE (DELTASONE) 5 MG tablet [id number] No Take 4 tablets (20 mg total) by mouth daily with breakfast for 3 days, THEN 3 tablets (15 mg total) daily with breakfast for 3 days, THEN 2 tablets (10 mg total) daily with breakfast for 3 days. Fuller Plan, MD Taking Active   Semaglutide,0.25 or 0.5MG /DOS, 2 MG/3ML Namon Cirri [id number] No Inject 0.25 mg into the skin once a week. Chauncey Mann, DO Taking Active   sertraline (ZOLOFT) 25 MG tablet [id number] No Take 1 tablet (25 mg total) by mouth daily. Chauncey Mann, DO Taking Active               Assessment/Plan:   Diabetes: - Currently controlled per home readings, but opportunity for optimization - Reviewed goal A1c, goal fasting, and goal 2 hour post prandial glucose -  Recommend to increase Ozempic to 0.5 mg weekly and reduce insulin ~20-30%, ~14 units twice daily. Continue metformin 500 mg twice daily. Will notify new PCP. Goal to reduce/eliminate insulin burden to reduce risk of hypoglycemia.  - Recommend to check glucose twice daily, fasting and 2 hour post prandial.   Hypertension: - Currently uncontrolled - Reviewed long term cardiovascular and renal outcomes of uncontrolled blood pressure - Reviewed appropriate blood pressure monitoring technique and reviewed goal blood pressure. Recommended to check home blood pressure and heart rate periodically - Recommend to continue current regimen at this time  Hyperlipidemia/ASCVD Risk Reduction: - Given DM + ASCVD risk, consider moderate intensity statin therapy. Would recommend updated lipid panel  Follow Up Plan: patient to call and schedule PCP follow up; I have scheduled a follow up call in ~ 8 weeks  Catie Eppie Gibson, PharmD, BCACP, CPP Clinical Pharmacist [facility] [phone]

## 2022-12-13 ENCOUNTER — Other Ambulatory Visit (HOSPITAL_COMMUNITY): Payer: Self-pay

## 2022-12-17 ENCOUNTER — Other Ambulatory Visit (HOSPITAL_COMMUNITY): Payer: Self-pay

## 2022-12-19 ENCOUNTER — Other Ambulatory Visit: Payer: Self-pay

## 2023-01-03 NOTE — Progress Notes (Signed)
Office Visit Note  Patient: Jeremiah Alexander             Date of Birth: 12/19/1970           MRN: [id number]             PCP: Kathleen Lime, MD Referring: Karoline Caldwell, MD Visit Date: 01/17/2023   Subjective:  Follow-up (Right wrist and left ankle pain and swelling, medications not helping)   History of Present Illness: Jeremiah Alexander is a 52 y.o. male here for follow up for seropositive RA on Enbrel 50 mg subcu weekly and hydroxychloroquine 400 mg daily prednisone 5 mg daily since last visit resuming the methotrexate 15 mg p.o. weekly folic acid 1 mg daily.  Despite this he continues with significant joint pain and stiffness currently most problematic areas in his right wrist and left ankle.  He has increased pain and swelling after walking or putting pressure onto his left ankle.  Is also describing increased trouble rising from a seated position has to use his hands for assistance due to weakness.  Has to watch his feet while walking as his ankle feels unstable and painful if he steps on any uneven ground.  Denies left leg numbness or pain radiating in the back of the leg.  He does have neuropathy not on specific medications for this.  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  Previous HPI 10/16/2022 Jeremiah Alexander is a 52 y.o. male here for follow up for seropositive RA on Enbrel 50 mg subcu weekly and hydroxychloroquine 400 mg daily and prednisone 5 mg daily.  We discussed resuming his previous methotrexate treatment after last visit due to ongoing joint pain but he is not actually on the medication mostly due to financial and transportation issue.  Most of his joint pain and swelling have been doing better and knee pain is improved after steroid injections at last visit.  For the past 1 to 2 weeks though left ankle pain increased has pain with pressure movement and with weightbearing.  He  did not recall any preceding injury or change in activity and has not been sick.  Doing slightly better now compared to a week ago he is not sure whether there is any additional swelling.   Previous HPI 08/14/22 Jeremiah Alexander is a 52 y.o. male here for follow up for seropositive RA on hydroxychloroquine 400 mg daily prednisone 5 mg daily after restarting Enbrel 50 mg subcu weekly after our visit in January.  He has seen an improvement in joint pain and stiffness symptoms.  Describes this as more relief than he has had on any previous treatment but still does not have him near his normal baseline. Knees still remain quite active and limiting his mobility. Symptoms are most severe involving his knees and in the right foot and ankle with some persistent swelling.  He has not noticed any serious side effects with the Enbrel injections.  He has had some increased achiness at the back of his scalp had some small scab or spots with excoriations but no visible rash.  Not seeing any rashes elsewhere.   Previous HPI 05/31/22 Jeremiah Alexander is a 52 y.o. male here for seropositive rheumatoid arthritis.  He is currently taking hydroxychloroquine 400 mg daily and prednisone 5 mg daily.  He was initially diagnosed with rheumatoid arthritis last year  at [facility] in [place].  Initial symptom onset was since at least about 2 years ago with gradually progressing worsening pain and swelling and weakness affecting his right hand and wrist.  Then involving both sides and subsequently proceeded to have worsening pain and swelling affecting both of his knees and feet as well.  Currently knee pain and swelling is his most problematic symptom and has severely reduced his mobility.  Laboratory findings showed highly positive rheumatoid factor and a low positive anti-CCP antibodies.  He was also started on methotrexate that he took for only a short time seems to have been discontinued just due to lack of refill or follow-up  rather than from any particular intolerance or contraindication.  However never had dramatic symptomatic improvement.  He was also recommended to start Enbrel but initially this was stopped after losing his medical insurance. He was hospitalized with acute pulmonary embolism last year thought most likely coming from his uncontrolled inflammatory arthritis and immobility with no previous history of blood clots or coagulation disorder.  He is now on indefinite Eliquis anticoagulation. He continues to smoke cigarettes on some days with >20 year total history.   Labs reviewed RF 187 CCP 39   DMARD Hx Prednisone - current Methotrexate - Short duration in 2023 unintentionally stopped Plaquenil - current Enbrel - planned   Review of Systems  Constitutional:  Positive for fatigue.  HENT:  Negative for mouth sores and mouth dryness.   Eyes:  Negative for dryness.  Respiratory:  Positive for shortness of breath.   Cardiovascular:  Negative for chest pain and palpitations.  Gastrointestinal:  Negative for blood in stool, constipation and diarrhea.  Endocrine: Positive for increased urination.  Genitourinary:  Negative for involuntary urination.  Musculoskeletal:  Positive for joint pain, gait problem, joint pain, joint swelling, myalgias, muscle weakness, morning stiffness, muscle tenderness and myalgias.  Skin:  Positive for rash. Negative for color change, hair loss and sensitivity to sunlight.  Allergic/Immunologic: Negative for susceptible to infections.  Neurological:  Negative for dizziness and headaches.  Hematological:  Negative for swollen glands.  Psychiatric/Behavioral:  Positive for depressed mood. Negative for sleep disturbance. The patient is nervous/anxious.     PMFS History:  Patient Active Problem List   Diagnosis Date Noted   Acid reflux 10/26/2022   Pain in left ankle and joints of left foot 10/16/2022   Other bilateral secondary osteoarthritis of knee 08/14/2022    Depression 08/07/2022   High risk medication use 05/31/2022   BP (high blood pressure) 05/31/2022   Compulsive tobacco user syndrome 05/31/2022   HLD (hyperlipidemia) 05/31/2022   Infection of the upper respiratory tract 05/31/2022   Seropositive rheumatoid arthritis (HCC) 03/09/2022   Preventative health care 03/09/2022   Pulmonary embolism without acute cor pulmonale (HCC) 02/22/2022   Pulmonary embolism (HCC) 02/22/2022   TOBACCO ABUSE 06/05/2010   Diabetes (HCC) 05/08/2010   OBESITY 05/08/2010   HYPERTENSION, BENIGN ESSENTIAL 05/08/2010    Past Medical History:  Diagnosis Date   Diabetes mellitus without complication (HCC)    High cholesterol    Hypertension     Family History  Problem Relation Age of Onset   Rheum arthritis Mother    Diabetes Other    Past Surgical History:  Procedure Laterality Date   TOE AMPUTATION     Right Pinky Toe   Social History   Social History Narrative   Not on file   Immunization History  Administered Date(s) Administered   Influenza Whole 04/12/2010  Tdap 03/09/2022     Objective: Vital Signs: BP (!) 162/98 (BP Location: Right Arm, Patient Position: Sitting, Cuff Size: Normal)   Pulse 74   Resp 15   Ht 5\' 11"  (1.803 m)   Wt 288 lb (130.6 kg)   BMI 40.17 kg/m    Physical Exam Eyes:     Conjunctiva/sclera: Conjunctivae normal.  Cardiovascular:     Rate and Rhythm: Normal rate and regular rhythm.  Pulmonary:     Effort: Pulmonary effort is normal.     Breath sounds: Normal breath sounds.  Musculoskeletal:     Right lower leg: No edema.     Left lower leg: No edema.  Skin:    General: Skin is warm and dry.     Findings: No rash.  Neurological:     Mental Status: He is alert.  Psychiatric:        Mood and Affect: Mood normal.      Musculoskeletal Exam:  Shoulders full ROM tenderness to pressure on anterior side of joint and across trapezius and upper back muscles, no radiation Elbows full ROM no tenderness or  swelling Right wrist swelling at the radial side and on flexor side, pain shooting up the arm with direct pressure and flexion range of motion No finger joint tenderness to pressure or palpable synovitis Knee tenderness to pressure and patellofemoral crepitus without palpable effusions Left ankle pain with dorsiflexion and inversion/eversion, tenderness on anterior and lateral sides, mild swelling present   Investigation: No additional findings.  Imaging: No results found.  Recent Labs: Lab Results  Component Value Date   WBC 9.2 10/16/2022   HGB 12.6 (L) 10/16/2022   PLT 268 10/16/2022   NA 140 10/26/2022   K 4.1 10/26/2022   CL 103 10/26/2022   CO2 23 10/26/2022   GLUCOSE 108 (H) 10/26/2022   BUN 10 10/26/2022   CREATININE 0.87 10/26/2022   BILITOT 0.3 10/16/2022   ALKPHOS 89 07/21/2012   AST 10 10/16/2022   ALT 10 10/16/2022   PROT 6.3 10/16/2022   ALBUMIN 3.6 04/22/2014   CALCIUM 9.2 10/26/2022   GFRAA >60 10/12/2019   QFTBGOLDPLUS NEGATIVE 05/31/2022    Speciality Comments: Enbrel started 05/31/2022  Procedures:  No procedures performed Allergies: Patient has no known allergies.   Assessment / Plan:     Visit Diagnoses: Seropositive rheumatoid arthritis (HCC) - Needs PLQ eye exam done. - Plan: Sedimentation rate, C-reactive protein, methotrexate (RHEUMATREX) 2.5 MG tablet, hydroxychloroquine (PLAQUENIL) 200 MG tablet, predniSONE (DELTASONE) 5 MG tablet, etanercept (ENBREL MINI) 50 MG/ML injection  Still has a high amount of joint pain and stiffness and impairs daily activities including ADLs.  There is only mild extent of peripheral joint synovitis appreciable on exam though objective swelling at the right wrist and left ankle.  Rechecking sed rate and CRP for disease activity assessment.  With continued smoking probably not getting a large benefit on hydroxychloroquine.  Alternatively has not seen a expect a degree of response to Enbrel treatment so far.  For now  continue Enbrel 50 mg subcu weekly methotrexate 15 mg p.o. weekly prednisone 5 mg daily folic acid 1 mg daily hydroxychloroquine 400 mg daily.  High risk medication use - If elevated does need to get back on methotrexate. If normal may be okay to continue on just Enbrel 50 mg subcu weekly and hydroxychloroquine 400 mg daily. - Plan: CBC with Differential/Platelet, COMPLETE METABOLIC PANEL WITH GFR  Checking CBC and CMP for medication monitoring on long-term treatment  with Enbrel and now back on methotrexate.  He had a bump in serum creatinine that resolved in June attributed to dehydration.  No serious interval infections.  Pain in left ankle and joints of left foot  Not sure how much of symptoms are continue RA activity versus underlying OA and his general deconditioning.  With worsening leg weakness gait and ADL trouble would benefit from physical therapy evaluation and treatment.  Also discussed possible addition of duloxetine for osteoarthritis and peripheral neuropathy pain.  HYPERTENSION, BENIGN ESSENTIAL  Uncontrolled hypertension initial blood pressure reading 213/120 but this improved to 162/98 at rest.  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  No current headache, vision change, leg swelling, chest pain or shortness of breath.  Avoiding repeat any intra-articular steroid injection today.  This problem is being followed with his primary care office.  Orders: Orders Placed This Encounter  Procedures   Sedimentation rate   C-reactive protein   CBC with Differential/Platelet   COMPLETE METABOLIC PANEL WITH GFR   Meds ordered this encounter  Medications   methotrexate (RHEUMATREX) 2.5 MG tablet    Sig: Take 6 tablets (15 mg total) by mouth once a week. Caution:Chemotherapy. Protect from light.    Dispense:  78 tablet    Refill:  0   hydroxychloroquine (PLAQUENIL) 200 MG tablet    Sig: Take 2 tablets (400 mg  total) by mouth daily.    Dispense:  180 tablet    Refill:  0   predniSONE (DELTASONE) 5 MG tablet    Sig: Take 1 tablet (5 mg total) by mouth daily with breakfast.    Dispense:  90 tablet    Refill:  0   etanercept (ENBREL MINI) 50 MG/ML injection    Sig: Inject 50 mg into the skin once a week.    Dispense:  12 mL    Refill:  0     Follow-Up Instructions: Return in about 3 months (around 04/19/2023) for RA on ENB/MTX/HCQ/GC f/u 3mos.   Fuller Plan, MD  Note - This record has been created using [employer].  Chart creation errors have been sought, but may not always  have been located. Such creation errors do not reflect on  the standard of medical care.

## 2023-01-15 ENCOUNTER — Other Ambulatory Visit: Payer: Self-pay | Admitting: Internal Medicine

## 2023-01-15 ENCOUNTER — Other Ambulatory Visit (HOSPITAL_COMMUNITY): Payer: Self-pay

## 2023-01-15 NOTE — Telephone Encounter (Signed)
Last Fill: 10/17/2022  Labs: 10/16/2022 CRP is mildly elevated at 9.2 and eosinophils are increased at 800 this may indicate some increased inflammation. I recommend we try initially a course of increased prednisone dose for the next several days. And do try to restart the methotrexate if he is able to get the medicine.   TB Gold: 05/31/2022  NEGATIVE   Next Visit: 01/17/2023  Last Visit: 10/16/2022  [id number] rheumatoid arthritis   Current Dose per office note 10/16/2022: Enbrel 50 mg subcu weekly   Okay to refill Enbrel?

## 2023-01-17 ENCOUNTER — Other Ambulatory Visit (HOSPITAL_COMMUNITY): Payer: Self-pay

## 2023-01-17 ENCOUNTER — Encounter: Payer: Self-pay | Admitting: Internal Medicine

## 2023-01-17 ENCOUNTER — Ambulatory Visit: Payer: Medicaid Other | Attending: Internal Medicine | Admitting: Internal Medicine

## 2023-01-17 ENCOUNTER — Other Ambulatory Visit: Payer: Self-pay

## 2023-01-17 VITALS — BP 162/98 | HR 74 | Resp 15 | Ht 71.0 in | Wt 288.0 lb

## 2023-01-17 DIAGNOSIS — I10 Essential (primary) hypertension: Secondary | ICD-10-CM | POA: Diagnosis not present

## 2023-01-17 DIAGNOSIS — M059 Rheumatoid arthritis with rheumatoid factor, unspecified: Secondary | ICD-10-CM | POA: Diagnosis not present

## 2023-01-17 DIAGNOSIS — Z7952 Long term (current) use of systemic steroids: Secondary | ICD-10-CM

## 2023-01-17 DIAGNOSIS — M25572 Pain in left ankle and joints of left foot: Secondary | ICD-10-CM

## 2023-01-17 DIAGNOSIS — Z79899 Other long term (current) drug therapy: Secondary | ICD-10-CM

## 2023-01-17 MED ORDER — ENBREL MINI 50 MG/ML ~~LOC~~ SOCT
50.0000 mg | SUBCUTANEOUS | 0 refills | Status: DC
Start: 2023-01-17 — End: 2023-06-12
  Filled 2023-01-17: qty 4, 28d supply, fill #0
  Filled 2023-03-25 – 2023-04-17 (×2): qty 4, 28d supply, fill #1
  Filled 2023-05-10: qty 4, 28d supply, fill #2

## 2023-01-17 MED ORDER — HYDROXYCHLOROQUINE SULFATE 200 MG PO TABS
400.0000 mg | ORAL_TABLET | Freq: Every day | ORAL | 0 refills | Status: DC
Start: 2023-01-17 — End: 2023-03-25
  Filled 2023-01-17: qty 180, 90d supply, fill #0

## 2023-01-17 MED ORDER — PREDNISONE 5 MG PO TABS
5.0000 mg | ORAL_TABLET | Freq: Every day | ORAL | 0 refills | Status: DC
  Filled 2023-01-17: qty 30, 30d supply, fill #0
  Filled 2023-03-25: qty 30, 30d supply, fill #1
  Filled 2023-07-16: qty 30, 30d supply, fill #2

## 2023-01-17 MED ORDER — METHOTREXATE SODIUM 2.5 MG PO TABS
15.0000 mg | ORAL_TABLET | ORAL | 0 refills | Status: DC
Start: 2023-01-17 — End: 2023-01-18
  Filled 2023-01-17: qty 24, 28d supply, fill #0

## 2023-01-18 ENCOUNTER — Other Ambulatory Visit (HOSPITAL_COMMUNITY): Payer: Self-pay

## 2023-01-18 ENCOUNTER — Other Ambulatory Visit: Payer: Self-pay | Admitting: *Deleted

## 2023-01-18 ENCOUNTER — Other Ambulatory Visit: Payer: Self-pay

## 2023-01-18 DIAGNOSIS — M059 Rheumatoid arthritis with rheumatoid factor, unspecified: Secondary | ICD-10-CM

## 2023-01-18 LAB — CBC WITH DIFFERENTIAL/PLATELET
Absolute Monocytes: 549 {cells}/uL (ref 200–950)
Basophils Absolute: 43 {cells}/uL (ref 0–200)
Basophils Relative: 0.7 %
Eosinophils Absolute: 232 {cells}/uL (ref 15–500)
Eosinophils Relative: 3.8 %
HCT: 42.3 % (ref 38.5–50.0)
Hemoglobin: 13.8 g/dL (ref 13.2–17.1)
Lymphs Abs: 1702 {cells}/uL (ref 850–3900)
MCH: 30 pg (ref 27.0–33.0)
MCHC: 32.6 g/dL (ref 32.0–36.0)
MCV: 92 fL (ref 80.0–100.0)
MPV: 9.7 fL (ref 7.5–12.5)
Monocytes Relative: 9 %
Neutro Abs: 3575 {cells}/uL (ref 1500–7800)
Neutrophils Relative %: 58.6 %
Platelets: 263 10*3/uL (ref 140–400)
RBC: 4.6 10*6/uL (ref 4.20–5.80)
RDW: 13 % (ref 11.0–15.0)
Total Lymphocyte: 27.9 %
WBC: 6.1 10*3/uL (ref 3.8–10.8)

## 2023-01-18 LAB — COMPLETE METABOLIC PANEL WITH GFR
AG Ratio: 1.3 (calc) (ref 1.0–2.5)
ALT: 9 U/L (ref 9–46)
AST: 12 U/L (ref 10–35)
Albumin: 3.9 g/dL (ref 3.6–5.1)
Alkaline phosphatase (APISO): 103 U/L (ref 35–144)
BUN: 13 mg/dL (ref 7–25)
CO2: 25 mmol/L (ref 20–32)
Calcium: 9 mg/dL (ref 8.6–10.3)
Chloride: 106 mmol/L (ref 98–110)
Creat: 1.03 mg/dL (ref 0.70–1.30)
Globulin: 3.1 g/dL (ref 1.9–3.7)
Glucose, Bld: 104 mg/dL — ABNORMAL HIGH (ref 65–99)
Potassium: 4.1 mmol/L (ref 3.5–5.3)
Sodium: 140 mmol/L (ref 135–146)
Total Bilirubin: 0.5 mg/dL (ref 0.2–1.2)
Total Protein: 7 g/dL (ref 6.1–8.1)
eGFR: 86 mL/min/{1.73_m2} (ref >=60)

## 2023-01-18 LAB — C-REACTIVE PROTEIN: CRP: 15.2 mg/L — ABNORMAL HIGH (ref <8.0)

## 2023-01-18 LAB — SEDIMENTATION RATE: Sed Rate: 14 mm/h (ref 0–20)

## 2023-01-18 MED ORDER — METHOTREXATE SODIUM 2.5 MG PO TABS
20.0000 mg | ORAL_TABLET | ORAL | 0 refills | Status: DC
  Filled 2023-01-18: qty 96, 84d supply, fill #0
  Filled 2023-01-18: qty 32, 28d supply, fill #0
  Filled 2023-03-25: qty 32, 28d supply, fill #1
  Filled 2023-04-24 – 2023-05-16 (×2): qty 32, 28d supply, fill #2

## 2023-01-18 NOTE — Telephone Encounter (Signed)
-----   Message from Jamesetta Orleans [facility] sent at 01/18/2023  9:24 AM EDT ----- His CRP is 15.2 this is worsened from 9.23 months ago and 6.0 earlier this year.  His blood count and metabolic panel are normal no problem with the medications.   I cannot recommend an increase to the prednisone dose or injections right now because his blood pressure is already uncontrolled and this would probably make it worse  I recommend he can try increasing the methotrexate dose to 8 tablets weekly.  (If he already picked up the prescription we can enter a no print dose adjustment just to document this otherwise we can send a new prescription at the correct amount 8 tablets weekly)  If there is not more improvement by our next follow-up we may have to switch away from the Enbrel to a different drug.

## 2023-01-18 NOTE — Progress Notes (Signed)
His CRP is 15.2 this is worsened from 9.23 months ago and 6.0 earlier this year.  His blood count and metabolic panel are normal no problem with the medications.   I cannot recommend an increase to the prednisone dose or injections right now because his blood pressure is already uncontrolled and this would probably make it worse  I recommend he can try increasing the methotrexate dose to 8 tablets weekly.  (If he already picked up the prescription we can enter a no print dose adjustment just to document this otherwise we can send a new prescription at the correct amount 8 tablets weekly)  If there is not more improvement by our next follow-up we may have to switch away from the Enbrel to a different drug.

## 2023-02-06 ENCOUNTER — Other Ambulatory Visit (HOSPITAL_COMMUNITY): Payer: Self-pay

## 2023-02-07 ENCOUNTER — Other Ambulatory Visit: Payer: Medicaid Other | Admitting: Pharmacist

## 2023-02-07 NOTE — Progress Notes (Signed)
02/07/2023 Name: Jeremiah Alexander MRN: [id number] DOB: 09-13-1970  Chief Complaint  Patient presents with   Diabetes   Hypertension    [name] is a 52 y.o. year old male who presented for a telephone visit.   They were referred to the pharmacist by their Case Management Team  for assistance in managing diabetes and hypertension.    Subjective:  Care Team: Primary Care Provider: Kathleen Lime, MD  Medication Access/Adherence  Current Pharmacy:  [facility] [phone] - Closed - [place], [place] - [address] [place] [address] Phone: [phone] Fax: [phone]  [place] [facility] [address] [place] [address] Phone: [phone] Fax: [phone]   Patient reports affordability concerns with their medications: No  Patient reports access/transportation concerns to their pharmacy: No  Patient reports adherence concerns with their medications:  No     Diabetes:  Current medications: metformin 500 mg twice daily, Ozempic 0.5 mg weekly, Humalog 75/25 20 units twice daily  Denies any GI upset with Ozempic  Current glucose readings:  reports readings remain well controlled <130 fasting  Patient denies hypoglycemic s/sx including dizziness, shakiness, sweating. Patient denies hyperglycemic symptoms including polyuria, polydipsia, polyphagia, nocturia, neuropathy, blurred vision.  Hypertension:  Current medications: lisinopril/hydrochlorothiazide 20/12.5 mg daily  Patient has a validated, automated, upper arm home BP cuff. Reports his pressures have always been very high.  Patient denies hypotensive s/sx including dizziness, lightheadedness.  Patient denies hypertensive symptoms including headache, chest pain, shortness of breath  Hyperlipidemia/ASCVD Risk Reduction   Current lipid lowering medications: none   PREVENT Risk Score: 10 year risk of CVD: 11.6% - 10 year risk of ASCVD: 6.2% - 10  year risk of HF: 9.5%   Objective:  Lab Results  Component Value Date   HGBA1C 5.7 (A) 08/07/2022    Lab Results  Component Value Date   CREATININE 1.03 01/17/2023   BUN 13 01/17/2023   NA 140 01/17/2023   K 4.1 01/17/2023   CL 106 01/17/2023   CO2 25 01/17/2023    Lab Results  Component Value Date   CHOL 197 04/22/2014   HDL 57 04/22/2014   LDLCALC 121 (H) 04/22/2014   TRIG 94 04/22/2014   CHOLHDL 2.6 07/21/2012    Medications Reviewed Today     Reviewed by Gabriel Carina, RPH (Pharmacist) on 02/07/23 at 2322  Med List Status: <None>   Medication Order Taking? Sig Documenting Provider Last Dose Status Informant  apixaban (ELIQUIS) 5 MG TABS tablet [id number] Yes Take 1 tablet (5 mg total) by mouth 2 (two) times daily. Chauncey Mann, DO Taking Active   blood glucose meter kit and supplies KIT [id number]  Use up to four times daily as directed. Zigmund Daniel., MD  Active   Blood Pressure Monitor DEVI [id number]  Use to check blood pressure daily. Hypertension I10.0 Atway, Rayann N, DO  Active   etanercept (ENBREL MINI) 50 MG/ML injection [id number] Yes Inject 50 mg into the skin once a week. Fuller Plan, MD Taking Active   Fingerstix Lancets MISC [id number]  use as directed to check blood sugars up to 4 times daily Zigmund Daniel., MD  Active   folic acid (FOLVITE) 1 MG tablet [id number]  Take 1 tablet (1 mg total) by mouth daily. Fuller Plan, MD  Active   glucose blood test strip [id number]  use as directed to check blood sugars up to  4 times daily Zigmund Daniel., MD  Active   hydroxychloroquine (PLAQUENIL) 200 MG tablet [id number]  Take 2 tablets (400 mg total) by mouth daily. Fuller Plan, MD  Active     Discontinued 02/23/22 1005 (Reorder)          Med Note Clearance Coots, CATHERINE T   Fri Sep 14, 2022 10:02 AM)    Insulin Lispro Prot & Lispro (HUMALOG 75/25 MIX) (75-25) 100 UNIT/ML Stephanie Coup [id number] Yes Inject 12 Units into the skin 2  (two) times daily with a meal. Atway, Rayann N, DO Taking Active            Med Note Clearance Coots, CATHERINE T   Mon Oct 22, 2022  9:35 AM) 20 units twice daily  Insulin Pen Needle 31G X 8 MM MISC [id number]  Use as needed to inject insulin. Chauncey Mann, DO  Active   lisinopril-hydrochlorothiazide (ZESTORETIC) 20-12.5 MG tablet [id number] Yes Take 1 tablet by mouth daily. Mapp, Gaylyn Cheers, MD Taking Active   metFORMIN (GLUCOPHAGE) 500 MG tablet [id number] Yes Take 1 tablet (500 mg total) by mouth 2 (two) times daily with a meal. Atway, Rayann N, DO Taking Active   methotrexate (RHEUMATREX) 2.5 MG tablet [id number]  Take 8 tablets (20 mg total) by mouth once a week. Caution:Chemotherapy. Protect from light. Fuller Plan, MD  Active   pantoprazole (PROTONIX) 40 MG tablet [id number]  Take 1 tablet (40 mg total) by mouth daily. Mapp, Gaylyn Cheers, MD  Active   predniSONE (DELTASONE) 5 MG tablet [id number] Yes Take 1 tablet (5 mg total) by mouth daily with breakfast. Fuller Plan, MD Taking Active   Semaglutide,0.25 or 0.5MG /DOS, 2 MG/3ML Namon Cirri [id number] Yes Inject 0.25 mg into the skin once a week. Chauncey Mann, DO Taking Active            Med Note Tyler Deis Feb 07, 2023 11:21 AM) Taking on Mondays as of 02/07/23 review  sertraline (ZOLOFT) 25 MG tablet [id number]  Take 1 tablet (25 mg total) by mouth daily. Chauncey Mann, DO  Active               Assessment/Plan:   Diabetes: - Currently controlled per home readings, but opportunity for optimization - Reviewed goal A1c, goal fasting, and goal 2 hour post prandial glucose - Recommend to increase Ozempic to 1 mg weekly and reduce insulin ~20-30%, ~14 units twice daily. Will collaborate with PCP. Continue metformin 500 mg twice daily. Goal to reduce/eliminate insulin burden to reduce risk of hypoglycemia.  - Recommend to check glucose twice daily, fasting and 2 hour post prandial.   Hypertension: - Currently uncontrolled -  Reviewed long term cardiovascular and renal outcomes of uncontrolled blood pressure - Reviewed appropriate blood pressure monitoring technique and reviewed goal blood pressure. Recommended to check home blood pressure and heart rate periodically - Recommend to continue current regimen at this time. Will check BP readings and we will discuss next week.  Hyperlipidemia/ASCVD Risk Reduction: - Given DM + ASCVD risk, consider moderate intensity statin therapy. Would recommend updated lipid panel  Follow Up Plan: 1 week to review BP numbers  Lynnda Shields, PharmD, BCPS Clinical Pharmacist [facility]

## 2023-02-12 ENCOUNTER — Telehealth: Payer: Self-pay | Admitting: Pharmacist

## 2023-02-12 ENCOUNTER — Other Ambulatory Visit: Payer: Medicaid Other | Admitting: Pharmacist

## 2023-02-12 ENCOUNTER — Other Ambulatory Visit: Payer: Self-pay

## 2023-02-12 NOTE — Progress Notes (Signed)
02/12/2023 Name: Jeremiah Alexander MRN: [id number] DOB: [birth year]  Attempted to contact patient x2 for scheduled appointment for medication management. Left HIPAA compliant message for patient to return my call at their convenience.   Will route to careguide to offer reschedule.  Lynnda Shields, PharmD, BCPS Clinical Pharmacist [facility]

## 2023-02-14 ENCOUNTER — Ambulatory Visit: Payer: Medicaid Other | Admitting: Physician Assistant

## 2023-02-20 ENCOUNTER — Encounter: Payer: Self-pay | Admitting: Internal Medicine

## 2023-02-20 ENCOUNTER — Telehealth: Payer: Self-pay

## 2023-02-20 ENCOUNTER — Ambulatory Visit (INDEPENDENT_AMBULATORY_CARE_PROVIDER_SITE_OTHER): Payer: Medicaid Other | Admitting: Internal Medicine

## 2023-02-20 ENCOUNTER — Other Ambulatory Visit (HOSPITAL_COMMUNITY): Payer: Self-pay

## 2023-02-20 VITALS — BP 150/98 | HR 87 | Ht 71.0 in | Wt 293.0 lb

## 2023-02-20 DIAGNOSIS — Z7901 Long term (current) use of anticoagulants: Secondary | ICD-10-CM | POA: Diagnosis not present

## 2023-02-20 DIAGNOSIS — K219 Gastro-esophageal reflux disease without esophagitis: Secondary | ICD-10-CM | POA: Diagnosis not present

## 2023-02-20 DIAGNOSIS — Z1211 Encounter for screening for malignant neoplasm of colon: Secondary | ICD-10-CM

## 2023-02-20 MED ORDER — NA SULFATE-K SULFATE-MG SULF 17.5-3.13-1.6 GM/177ML PO SOLN
1.0000 | ORAL | 0 refills | Status: DC
  Filled 2023-02-20: qty 354, 1d supply, fill #0

## 2023-02-20 NOTE — Progress Notes (Signed)
Chief Complaint: Colon cancer screening  HPI : 52 year old male with history of PE on Eliquis, HFpEF, GERD, DM, and RA presents to discuss colon cancer screening  Denies blood in the stools, changes in bowel habits, diarrhea, constipation, or unintentional weight loss. He does have some heartburn issues for which he takes pantoprazole 40 mg every day, which works well for controlling his GERD. Denies chest burning or regurgitation. Denies dysphagia or ab pain. Denies prior colonoscopy. He is on Eliquis. Uncle had esophageal cancer.  Denies other family history of GI issues.  Denies family history of colon cancer.  He does smoke. Denies chest pain or SOB.    Past Medical History:  Diagnosis Date   Diabetes mellitus without complication (HCC)    GERD (gastroesophageal reflux disease)    High cholesterol    Hypertension    RA (rheumatoid arthritis) (HCC)      Past Surgical History:  Procedure Laterality Date   TOE AMPUTATION     Right Pinky Toe   Family History  Problem Relation Age of Onset   Rheum arthritis Mother    Diabetes Other    Throat cancer Maternal Uncle        smoker   Colon cancer Neg Hx    Social History   Tobacco Use   Smoking status: Every Day    Current packs/day: 0.25    Average packs/day: 0.3 packs/day for 30.0 years (7.5 ttl pk-yrs)    Types: Cigarettes    Passive exposure: Current   Smokeless tobacco: Never  Vaping Use   Vaping status: Never Used  Substance Use Topics   Alcohol use: No   Drug use: Not Currently   Current Outpatient Medications  Medication Sig Dispense Refill   apixaban (ELIQUIS) 5 MG TABS tablet Take 1 tablet (5 mg total) by mouth 2 (two) times daily. 180 tablet 1   blood glucose meter kit and supplies KIT Use up to four times daily as directed. 1 each 0   Blood Pressure Monitor DEVI Use to check blood pressure daily. Hypertension I10.0 1 each 0   etanercept (ENBREL MINI) 50 MG/ML injection Inject 50 mg into the skin once a  week. 12 mL 0   Fingerstix Lancets MISC use as directed to check blood sugars up to 4 times daily 100 each 0   folic acid (FOLVITE) 1 MG tablet Take 1 tablet (1 mg total) by mouth daily. 90 tablet 1   glucose blood test strip use as directed to check blood sugars up to 4 times daily 100 each 0   hydroxychloroquine (PLAQUENIL) 200 MG tablet Take 2 tablets (400 mg total) by mouth daily. 180 tablet 0   Insulin Lispro Prot & Lispro (HUMALOG 75/25 MIX) (75-25) 100 UNIT/ML Kwikpen Inject 12 Units into the skin 2 (two) times daily with a meal. 15 mL 1   Insulin Pen Needle 31G X 8 MM MISC Use as needed to inject insulin. 100 each 0   lisinopril-hydrochlorothiazide (ZESTORETIC) 20-12.5 MG tablet Take 1 tablet by mouth daily. 90 tablet 1   metFORMIN (GLUCOPHAGE) 500 MG tablet Take 1 tablet (500 mg total) by mouth 2 (two) times daily with a meal. 180 tablet 1   methotrexate (RHEUMATREX) 2.5 MG tablet Take 8 tablets (20 mg total) by mouth once a week. Caution:Chemotherapy. Protect from light. 96 tablet 0   Na Sulfate-K Sulfate-Mg Sulf 17.5-3.13-1.6 GM/177ML SOLN Take 1 kit by mouth as directed on packaging. 354 mL 0   pantoprazole (  PROTONIX) 40 MG tablet Take 1 tablet (40 mg total) by mouth daily. 30 tablet 1   predniSONE (DELTASONE) 5 MG tablet Take 1 tablet (5 mg total) by mouth daily with breakfast. 90 tablet 0   Semaglutide,0.25 or 0.5MG /DOS, 2 MG/3ML SOPN Inject 0.25 mg into the skin once a week. 3 mL 3   sertraline (ZOLOFT) 25 MG tablet Take 1 tablet (25 mg total) by mouth daily. 30 tablet 1   No current facility-administered medications for this visit.   No Known Allergies   Review of Systems: All systems reviewed and negative except where noted in HPI.   Physical Exam: BP (!) 150/98   Pulse 87   Ht 5\' 11"  (1.803 m)   Wt 293 lb (132.9 kg)   BMI 40.87 kg/m  Constitutional: Pleasant,well-developed, male in no acute distress. HEENT: Normocephalic and atraumatic. Conjunctivae are normal. No  scleral icterus. Cardiovascular: Normal rate, regular rhythm.  Pulmonary/chest: Effort normal and breath sounds normal. No wheezing, rales or rhonchi. Abdominal: Soft, nondistended, nontender. Bowel sounds active throughout. There are no masses palpable. No hepatomegaly. Extremities: No edema Neurological: Alert and oriented to person place and time. Skin: Skin is warm and dry. No rashes noted. Psychiatric: Normal mood and affect. Behavior is normal.  Labs 12/2022: CBC nml. CMP unremarkable. CRP elevated at 15.2. ESR nml.   TTE 02/22/22: 1. Left ventricular ejection fraction, by estimation, is 50 to 55%. The left ventricle has low normal function. The left ventricle has no regional wall motion abnormalities. Left ventricular diastolic parameters are consistent with Grade I diastolic dysfunction (impaired relaxation).   2. Right ventricular systolic function is normal. The right ventricular size is mildly enlarged. Tricuspid regurgitation signal is inadequate for assessing PA pressure.   3. The mitral valve is normal in structure. Trivial mitral valve regurgitation. No evidence of mitral stenosis.   4. The aortic valve is tricuspid. Aortic valve regurgitation is not visualized. No aortic stenosis is present.   5. There is borderline dilatation of the ascending aorta, measuring 38 mm.   6. The inferior vena cava is dilated in size with >50% respiratory variability, suggesting right atrial pressure of 8 mmHg.   ASSESSMENT AND PLAN: Colon cancer screening GERD Patient is here to discuss colon cancer screening.  He has never had a colonoscopy in the past.  He is on Eliquis for pulmonary embolism that happened a year ago.  I went over the colonoscopy procedure in detail with the patient and he is agreeable to proceeding.  Does have acid reflux but this is currently well-controlled on daily PPI therapy. - Colonoscopy [facility]. Will need Eliquis held for 2 days beforehand (will contact his prescriber Dr.  Mickie Bail). Will hold Ozempic 7 days beforehand. - Cont pantoprazole 40 mg QD  Eulah Pont, MD  I spent 45 minutes of time, including in depth chart review, independent review of results as outlined above, communicating results with the patient directly, face-to-face time with the patient, coordinating care, ordering studies and medications as appropriate, and documentation.

## 2023-02-20 NOTE — Telephone Encounter (Signed)
[name] 25-Sep-1970 [id number]  Dear  Dr Mickie Bail, Jeremiah Alexander   We have scheduled the above named patient for a(n) Colononoscopy procedure. Our records show that (s)he is on anticoagulation therapy.  Please advise as to whether the patient may come off their therapy of Eliquis 2 days prior to their procedure which is scheduled for 03/11/23.  Please route your response to [facility] or fax response to [phone].  Sincerely,    [facility]

## 2023-02-20 NOTE — Patient Instructions (Addendum)
You have been scheduled for a colonoscopy. Please follow written instructions given to you at your visit today.   Please pick up your prep supplies at the pharmacy within the next 1-3 days.  If you use inhalers (even only as needed), please bring them with you on the day of your procedure.  DO NOT TAKE 7 DAYS PRIOR TO TEST- Trulicity (dulaglutide) Ozempic, Wegovy (semaglutide) Mounjaro (tirzepatide) Bydureon Bcise (exanatide extended release)  DO NOT TAKE 1 DAY PRIOR TO YOUR TEST Rybelsus (semaglutide) Adlyxin (lixisenatide) Victoza (liraglutide) Byetta (exanatide)  You will be contacted by our office prior to your procedure for directions on holding your Eliquis.  If you do not hear from our office 1 week prior to your scheduled procedure, please call [phone] to discuss.   We have sent the following medications to your pharmacy for you to pick up at your convenience: Suprep  _______________________________________________________  If your blood pressure at your visit was 140/90 or greater, please contact your primary care physician to follow up on this.  _______________________________________________________  If you are age 6 or older, your body mass index should be between 23-30. Your Body mass index is 40.87 kg/m. If this is out of the aforementioned range listed, please consider follow up with your Primary Care Provider.  If you are age 3 or younger, your body mass index should be between 19-25. Your Body mass index is 40.87 kg/m. If this is out of the aformentioned range listed, please consider follow up with your Primary Care Provider.   ________________________________________________________  The [facility] providers would like to encourage you to use [facility] to communicate with providers for non-urgent requests or questions.  Due to long hold times on the telephone, sending your provider a message by [facility] may be a faster and more efficient way to get a  response.  Please allow 48 business hours for a response.  Please remember that this is for non-urgent requests.  _______________________________________________________  Due to recent changes in healthcare laws, you may see the results of your imaging and laboratory studies on MyChart before your provider has had a chance to review them.  We understand that in some cases there may be results that are confusing or concerning to you. Not all laboratory results come back in the same time frame and the provider may be waiting for multiple results in order to interpret others.  Please give [place] 48 hours in order for your provider to thoroughly review all the results before contacting the office for clarification of your results.   Thank you for entrusting me with your care and for choosing [facility], Dr. Eulah Pont

## 2023-02-20 NOTE — Progress Notes (Signed)
Care Coordination Note  02/20/2023 Name: Jeremiah Alexander MRN: [id number] DOB: 1971-01-12  Jeremiah Alexander is a 52 y.o. year old male who is a primary care patient of Kathleen Lime, MD and is actively engaged with the Chronic Care Management team. I reached out to [name] by phone today to assist with re-scheduling a follow up visit with the Pharmacist  Follow up plan: Unsuccessful telephone outreach attempt made. A HIPAA compliant phone message was left for the patient providing contact information and requesting a return call.  If patient returns call to provider office, please advise to call Care Guide Penne Lash  at [phone]  Penne Lash, RMA Care Guide [facility]  [place], [place] [address] Direct Dial: [phone] [email or web address]

## 2023-02-21 ENCOUNTER — Telehealth: Payer: Self-pay

## 2023-02-21 NOTE — Telephone Encounter (Signed)
Spoke to patient advise okay to hold his Eliquis 48 hours before procedure. If for any reason the procedure is delayed or cancelled, PLEASE resume Eliquis immediately and let provider know.

## 2023-02-26 ENCOUNTER — Other Ambulatory Visit (HOSPITAL_COMMUNITY): Payer: Self-pay

## 2023-02-28 NOTE — Progress Notes (Signed)
Care Coordination Note  02/28/2023 Name: Jeremiah Alexander MRN: [id number] DOB: 1970-12-20  Jeremiah Alexander is a 52 y.o. year old male who is a primary care patient of Kathleen Lime, MD and is actively engaged with the Chronic Care Management team. I reached out to [name] by phone today to assist with re-scheduling a follow up visit with the Pharmacist  Follow up plan: Unsuccessful telephone outreach attempt made. A HIPAA compliant phone message was left for the patient providing contact information and requesting a return call.  If patient returns call to provider office, please advise to call  Care Guide Penne Lash  at [phone]  Penne Lash, RMA Care Guide [facility]  [place], [place] [address] Direct Dial: [phone] [email or web address]

## 2023-03-06 NOTE — Progress Notes (Signed)
Care Guide Note  03/06/2023 Name: DUSTIN GOETTE MRN: [id number] DOB: Jul 31, 1970  Referred by: Kathleen Lime, MD Reason for referral : Care Coordination (Outreach to reschedule with Pharm d )   JAKYRON SCHRANDT is a 53 y.o. year old male who is a primary care patient of Kathleen Lime, MD. QUANDELL ABELLERA was referred to the pharmacist for assistance related to HTN.    A third unsuccessful telephone outreach was attempted today to contact the patient who was referred to the pharmacy team for assistance with medication management. The Population Health team is pleased to engage with this patient at any time in the future upon receipt of referral and should he/she be interested in assistance from the [facility] team.   Penne Lash, RMA Care Guide [facility]  [place], [place] [address] Direct Dial: [phone] [email or web address]

## 2023-03-11 ENCOUNTER — Encounter: Payer: Self-pay | Admitting: Internal Medicine

## 2023-03-11 ENCOUNTER — Ambulatory Visit: Payer: Medicaid Other | Admitting: Internal Medicine

## 2023-03-11 VITALS — BP 135/96 | HR 68 | Temp 99.3°F | Resp 17 | Ht 71.0 in | Wt 293.0 lb

## 2023-03-11 DIAGNOSIS — D3A8 Other benign neuroendocrine tumors: Secondary | ICD-10-CM | POA: Diagnosis not present

## 2023-03-11 DIAGNOSIS — K633 Ulcer of intestine: Secondary | ICD-10-CM | POA: Diagnosis not present

## 2023-03-11 DIAGNOSIS — E119 Type 2 diabetes mellitus without complications: Secondary | ICD-10-CM | POA: Diagnosis not present

## 2023-03-11 DIAGNOSIS — D122 Benign neoplasm of ascending colon: Secondary | ICD-10-CM

## 2023-03-11 DIAGNOSIS — Z1211 Encounter for screening for malignant neoplasm of colon: Secondary | ICD-10-CM

## 2023-03-11 DIAGNOSIS — D128 Benign neoplasm of rectum: Secondary | ICD-10-CM | POA: Diagnosis not present

## 2023-03-11 DIAGNOSIS — E7800 Pure hypercholesterolemia, unspecified: Secondary | ICD-10-CM | POA: Diagnosis not present

## 2023-03-11 DIAGNOSIS — M069 Rheumatoid arthritis, unspecified: Secondary | ICD-10-CM | POA: Diagnosis not present

## 2023-03-11 DIAGNOSIS — K635 Polyp of colon: Secondary | ICD-10-CM | POA: Diagnosis not present

## 2023-03-11 DIAGNOSIS — I10 Essential (primary) hypertension: Secondary | ICD-10-CM | POA: Diagnosis not present

## 2023-03-11 MED ORDER — SODIUM CHLORIDE 0.9 % IV SOLN: 500.0000 mL | Freq: Once | INTRAVENOUS | Status: DC

## 2023-03-11 NOTE — Op Note (Signed)
[facility]oscopy Center Patient Name: Jeremiah Alexander Procedure Date: 03/11/2023 8:17 AM MRN: [id number] Endoscopist: Jeremiah Alexander , , [id number] Age: 52 Referring MD:  Date of Birth: 1970/06/25 Gender: Male Account #: [id number] Procedure:                Colonoscopy Indications:              Screening for colorectal malignant neoplasm, This                            is the patient's first colonoscopy Medicines:                Monitored Anesthesia Care Procedure:                Pre-Anesthesia Assessment:                           - Prior to the procedure, a History and Physical                            was performed, and patient medications and                            allergies were reviewed. The patient's tolerance of                            previous anesthesia was also reviewed. The risks                            and benefits of the procedure and the sedation                            options and risks were discussed with the patient.                            All questions were answered, and informed consent                            was obtained. Prior Anticoagulants: The patient has                            taken Eliquis (apixaban), last dose was 5 days                            prior to procedure. ASA Grade Assessment: III - A                            patient with severe systemic disease. After                            reviewing the risks and benefits, the patient was                            deemed in satisfactory condition to undergo the  procedure.                           After obtaining informed consent, the colonoscope                            was passed under direct vision. Throughout the                            procedure, the patient's blood pressure, pulse, and                            oxygen saturations were monitored continuously. The                            CF [id number] #[id number] was introduced through the  anus                            and advanced to the the terminal ileum. The                            colonoscopy was performed without difficulty. The                            patient tolerated the procedure well. The quality                            of the bowel preparation was good. The terminal                            ileum, ileocecal valve, appendiceal orifice, and                            rectum were photographed. Scope In: 8:31:51 AM Scope Out: 9:04:05 AM Scope Withdrawal Time: 0 hours 29 minutes 11 seconds  Total Procedure Duration: 0 hours 32 minutes 14 seconds  Findings:                 The terminal ileum contained a few localized                            non-bleeding erosions. No stigmata of recent                            bleeding were seen. Biopsies were taken with a cold                            forceps for histology.                           A localized area of mucosa in the terminal ileum                            was nodular (perhaps prolapsed ileal mucosa that  projected out of the ileocecal valve). Biopsies                            were taken with a cold forceps for histology.                           A 3 to 4 mm polyp was found in the rectum ascending                            colon. The polyp was sessile. These polyps were                            removed with a cold snare. Resection and retrieval                            were complete.                           Multiple diverticula were found in the sigmoid                            colon.                           Non-bleeding internal hemorrhoids were found during                            retroflexion. Complications:            No immediate complications. Estimated Blood Loss:     Estimated blood loss was minimal. Impression:               - A few erosions in the terminal ileum. Biopsied.                           - Nodular ileal mucosa (perhaps prolapsed  ileal                            mucosa). Biopsied.                           - One 3 to 4 mm polyp in the rectum in the                            ascending colon, removed with a cold snare.                            Resected and retrieved.                           - Diverticulosis in the sigmoid colon.                           - Non-bleeding internal hemorrhoids. Recommendation:           - Discharge patient to home (with escort).                           -  Await pathology results.                           - Okay to restart your Eliquis tomorrow.                           - The findings and recommendations were discussed                            with the patient. Dr Jeremiah Alexander "Jeremiah Ripper" Leonides Alexander,  03/11/2023 9:13:22 AM

## 2023-03-11 NOTE — Patient Instructions (Signed)
   OK to restart Eliquis tomorrow  Handouts on polyps,diverticulosis,& hemorrhoids given to you today.   Await pathology results on polyps removed  Await biopsy results of Terminal Ileum   Continue previous diet & medications   YOU HAD AN ENDOSCOPIC PROCEDURE TODAY AT THE [facility]:   Refer to the procedure report that was given to you for any specific questions about what was found during the examination.  If the procedure report does not answer your questions, please call your gastroenterologist to clarify.  If you requested that your care partner not be given the details of your procedure findings, then the procedure report has been included in a sealed envelope for you to review at your convenience later.  YOU SHOULD EXPECT: Some feelings of bloating in the abdomen. Passage of more gas than usual.  Walking can help get rid of the air that was put into your GI tract during the procedure and reduce the bloating. If you had a lower endoscopy (such as a colonoscopy or flexible sigmoidoscopy) you may notice spotting of blood in your stool or on the toilet paper. If you underwent a bowel prep for your procedure, you may not have a normal bowel movement for a few days.  Please Note:  You might notice some irritation and congestion in your nose or some drainage.  This is from the oxygen used during your procedure.  There is no need for concern and it should clear up in a day or so.  SYMPTOMS TO REPORT IMMEDIATELY:  Following lower endoscopy (colonoscopy or flexible sigmoidoscopy):  Excessive amounts of blood in the stool  Significant tenderness or worsening of abdominal pains  Swelling of the abdomen that is new, acute  Fever of 100F or higher  For urgent or emergent issues, a gastroenterologist can be reached at any hour by calling [phone]. Do not use MyChart messaging for urgent concerns.    DIET:  We do recommend a small meal at first, but then you may proceed to  your regular diet.  Drink plenty of fluids but you should avoid alcoholic beverages for 24 hours.  ACTIVITY:  You should plan to take it easy for the rest of today and you should NOT DRIVE or use heavy machinery until tomorrow (because of the sedation medicines used during the test).    FOLLOW UP: Our staff will call the number listed on your records the next business day following your procedure.  We will call around 7:15- 8:00 am to check on you and address any questions or concerns that you may have regarding the information given to you following your procedure. If we do not reach you, we will leave a message.     If any biopsies were taken you will be contacted by phone or by letter within the next 1-3 weeks.  Please call us at [phone] if you have not heard about the biopsies in 3 weeks.    SIGNATURES/CONFIDENTIALITY: You and/or your care partner have signed paperwork which will be entered into your electronic medical record.  These signatures attest to the fact that that the information above on your After Visit Summary has been reviewed and is understood.  Full responsibility of the confidentiality of this discharge information lies with you and/or your care-partner.

## 2023-03-11 NOTE — Progress Notes (Signed)
GASTROENTEROLOGY PROCEDURE H&P NOTE   Primary Care Physician: Kathleen Lime, MD    Reason for Procedure:   Colon cancer screening  Plan:    Colonoscopy  Patient is appropriate for endoscopic procedure(s) in the ambulatory ([facility]) setting.  The nature of the procedure, as well as the risks, benefits, and alternatives were carefully and thoroughly reviewed with the patient. Ample time for discussion and questions allowed. The patient understood, was satisfied, and agreed to proceed.     HPI: Jeremiah Alexander is a 52 y.o. male who presents for colonoscopy for evaluation of colon cancer screening .  Patient was most recently seen in the Gastroenterology Clinic on 02/20/23.  No interval change in medical history since that appointment. Please refer to that note for full details regarding GI history and clinical presentation.   Past Medical History:  Diagnosis Date   Diabetes mellitus without complication (HCC)    GERD (gastroesophageal reflux disease)    High cholesterol    Hypertension    RA (rheumatoid arthritis) (HCC)     Past Surgical History:  Procedure Laterality Date   TOE AMPUTATION     Right Pinky Toe    Prior to Admission medications   Medication Sig Start Date End Date Taking? Authorizing Provider  apixaban (ELIQUIS) 5 MG TABS tablet Take 1 tablet (5 mg total) by mouth 2 (two) times daily. 08/09/22  Yes Atway, Rayann N, DO  blood glucose meter kit and supplies KIT Use up to four times daily as directed. 02/23/22  Yes Zigmund Daniel., MD  etanercept (ENBREL MINI) 50 MG/ML injection Inject 50 mg into the skin once a week. 01/17/23  Yes Rice, Jamesetta Orleans, MD  Fingerstix Lancets MISC use as directed to check blood sugars up to 4 times daily 02/23/22  Yes Zigmund Daniel., MD  folic acid (FOLVITE) 1 MG tablet Take 1 tablet (1 mg total) by mouth daily. 08/15/22  Yes Rice, Jamesetta Orleans, MD  glucose blood test strip use as directed to check blood sugars up to 4  times daily 02/23/22  Yes Zigmund Daniel., MD  hydroxychloroquine (PLAQUENIL) 200 MG tablet Take 2 tablets (400 mg total) by mouth daily. 01/17/23  Yes Rice, Jamesetta Orleans, MD  insulin aspart protamine- aspart (NOVOLOG MIX 70/30) (70-30) 100 UNIT/ML injection Inject into the skin. 07/21/12  Yes [provider]  Insulin Lispro Prot & Lispro (HUMALOG 75/25 MIX) (75-25) 100 UNIT/ML Kwikpen Inject 12 Units into the skin 2 (two) times daily with a meal. 08/09/22  Yes Atway, Rayann N, DO  Insulin Pen Needle 31G X 8 MM MISC Use as needed to inject insulin. 08/09/22  Yes Atway, Rayann N, DO  lisinopril-hydrochlorothiazide (ZESTORETIC) 20-12.5 MG tablet Take 1 tablet by mouth daily. 10/26/22  Yes Mapp, Gaylyn Cheers, MD  metFORMIN (GLUCOPHAGE) 500 MG tablet Take 1 tablet (500 mg total) by mouth 2 (two) times daily with a meal. 08/09/22  Yes Atway, Rayann N, DO  methotrexate (RHEUMATREX) 2.5 MG tablet Take 8 tablets (20 mg total) by mouth once a week. Caution:Chemotherapy. Protect from light. 01/18/23  Yes Rice, Jamesetta Orleans, MD  pantoprazole (PROTONIX) 40 MG tablet Take 1 tablet (40 mg total) by mouth daily. 10/26/22  Yes Mapp, Gaylyn Cheers, MD  predniSONE (DELTASONE) 5 MG tablet Take 1 tablet (5 mg total) by mouth daily with breakfast. 01/17/23  Yes Rice, Jamesetta Orleans, MD  sertraline (ZOLOFT) 25 MG tablet Take 1 tablet (25 mg total) by mouth daily. 08/09/22  Yes  Atway, Rayann N, DO  Blood Pressure Monitor DEVI Use to check blood pressure daily. Hypertension I10.0 08/13/22   Atway, Rayann N, DO  Semaglutide,0.25 or 0.5MG /DOS, 2 MG/3ML SOPN Inject 0.25 mg into the skin once a week. 08/09/22   Chauncey Mann, DO    Current Outpatient Medications  Medication Sig Dispense Refill   apixaban (ELIQUIS) 5 MG TABS tablet Take 1 tablet (5 mg total) by mouth 2 (two) times daily. 180 tablet 1   blood glucose meter kit and supplies KIT Use up to four times daily as directed. 1 each 0   etanercept (ENBREL MINI) 50 MG/ML injection  Inject 50 mg into the skin once a week. 12 mL 0   Fingerstix Lancets MISC use as directed to check blood sugars up to 4 times daily 100 each 0   folic acid (FOLVITE) 1 MG tablet Take 1 tablet (1 mg total) by mouth daily. 90 tablet 1   glucose blood test strip use as directed to check blood sugars up to 4 times daily 100 each 0   hydroxychloroquine (PLAQUENIL) 200 MG tablet Take 2 tablets (400 mg total) by mouth daily. 180 tablet 0   insulin aspart protamine- aspart (NOVOLOG MIX 70/30) (70-30) 100 UNIT/ML injection Inject into the skin.     Insulin Lispro Prot & Lispro (HUMALOG 75/25 MIX) (75-25) 100 UNIT/ML Kwikpen Inject 12 Units into the skin 2 (two) times daily with a meal. 15 mL 1   Insulin Pen Needle 31G X 8 MM MISC Use as needed to inject insulin. 100 each 0   lisinopril-hydrochlorothiazide (ZESTORETIC) 20-12.5 MG tablet Take 1 tablet by mouth daily. 90 tablet 1   metFORMIN (GLUCOPHAGE) 500 MG tablet Take 1 tablet (500 mg total) by mouth 2 (two) times daily with a meal. 180 tablet 1   methotrexate (RHEUMATREX) 2.5 MG tablet Take 8 tablets (20 mg total) by mouth once a week. Caution:Chemotherapy. Protect from light. 96 tablet 0   pantoprazole (PROTONIX) 40 MG tablet Take 1 tablet (40 mg total) by mouth daily. 30 tablet 1   predniSONE (DELTASONE) 5 MG tablet Take 1 tablet (5 mg total) by mouth daily with breakfast. 90 tablet 0   sertraline (ZOLOFT) 25 MG tablet Take 1 tablet (25 mg total) by mouth daily. 30 tablet 1   Blood Pressure Monitor DEVI Use to check blood pressure daily. Hypertension I10.0 1 each 0   Semaglutide,0.25 or 0.5MG /DOS, 2 MG/3ML SOPN Inject 0.25 mg into the skin once a week. 3 mL 3   Current Facility-Administered Medications  Medication Dose Route Frequency Provider Last Rate Last Admin   0.9 %  sodium chloride infusion  500 mL Intravenous Once Imogene Burn, MD        Allergies as of 03/11/2023   (No Known Allergies)    Family History  Problem Relation Age of  Onset   Rheum arthritis Mother    Throat cancer Maternal Uncle        smoker   Diabetes Other    Colon cancer Neg Hx    Esophageal cancer Neg Hx    Rectal cancer Neg Hx    Stomach cancer Neg Hx     Social History   Socioeconomic History   Marital status: Single    Spouse name: Not on file   Number of children: 1   Years of education: Not on file   Highest education level: Not on file  Occupational History   Occupation: disablity  Tobacco Use  Smoking status: Every Day    Current packs/day: 0.25    Average packs/day: 0.3 packs/day for 30.0 years (7.5 ttl pk-yrs)    Types: Cigarettes    Passive exposure: Current   Smokeless tobacco: Never  Vaping Use   Vaping status: Never Used  Substance and Sexual Activity   Alcohol use: No   Drug use: Not Currently   Sexual activity: Yes    Birth control/protection: Condom  Other Topics Concern   Not on file  Social History Narrative   Not on file   Social Determinants of Health   Financial Resource Strain: Medium Risk (08/13/2022)   Overall Financial Resource Strain (CARDIA)    Difficulty of Paying Living Expenses: Somewhat hard  Food Insecurity: No Food Insecurity (02/23/2022)   Hunger Vital Sign    Worried About Running Out of Food in the Last Year: Never true    Ran Out of Food in the Last Year: Never true  Transportation Needs: No Transportation Needs (02/23/2022)   PRAPARE - Administrator, Civil Service (Medical): No    Lack of Transportation (Non-Medical): No  Physical Activity: Not on file  Stress: Not on file  Social Connections: Not on file  Intimate Partner Violence: Not At Risk (02/23/2022)   Humiliation, Afraid, Rape, and Kick questionnaire    Fear of Current or Ex-Partner: No    Emotionally Abused: No    Physically Abused: No    Sexually Abused: No    Physical Exam: Vital signs in last 24 hours: BP (!) 159/87   Pulse 70   Temp 99.3 F (37.4 C)   Ht 5\' 11"  (1.803 m)   Wt 293 lb (132.9 kg)    SpO2 99%   BMI 40.87 kg/m  GEN: NAD EYE: Sclerae anicteric ENT: MMM CV: Non-tachycardic Pulm: No increased WOB GI: Soft NEURO:  Alert & Oriented   Eulah Pont, MD [facility]   03/11/2023 8:07 AM

## 2023-03-11 NOTE — Progress Notes (Signed)
VS by DT  Pt's states no medical or surgical changes since previsit or office visit.  

## 2023-03-11 NOTE — Progress Notes (Signed)
Called to room to assist during endoscopic procedure.  Patient ID and intended procedure confirmed with present staff. Received instructions for my participation in the procedure from the performing physician.  

## 2023-03-11 NOTE — Progress Notes (Signed)
Uneventful anesthetic. Report to pacu rn. Vss. Care resumed by rn. 

## 2023-03-12 ENCOUNTER — Telehealth: Payer: Self-pay | Admitting: *Deleted

## 2023-03-12 NOTE — Telephone Encounter (Signed)
  Follow up Call-     03/11/2023    7:55 AM  Call back number  Post procedure Call Back phone  # [phone]  Permission to leave phone message Yes   [facility]

## 2023-03-14 LAB — SURGICAL PATHOLOGY

## 2023-03-14 NOTE — Progress Notes (Signed)
Spoke to the patient about the results of his colonoscopy biopsies.  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  The rest of his small bowel biopsies came back as normal and his colon polyps came back as benign.  I recommended that we proceed with a PET/CT dotatate scan for further evaluation to see if the cancer has spread to anywhere else on his body.  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  Beth, please order an ASAP PET/CT dotatate scan and place an urgent referral to [facility] please. Thanks.

## 2023-03-18 ENCOUNTER — Telehealth: Payer: Self-pay | Admitting: Internal Medicine

## 2023-03-18 ENCOUNTER — Other Ambulatory Visit: Payer: Self-pay

## 2023-03-18 DIAGNOSIS — C7A8 Other malignant neuroendocrine tumors: Secondary | ICD-10-CM

## 2023-03-18 NOTE — Addendum Note (Signed)
Addended by: Heber Omar A on: 03/18/2023 03:53 PM   Modules accepted: Orders

## 2023-03-18 NOTE — Telephone Encounter (Signed)
I spoke to the peer-to-peer phone line. PET CT Dotatate scan was approved. They said that the only thing they were missing to get the imaging approved was specifying the imaging tracer as Dotatate. Apparently "unknown tracer" was put in for the prior authorization, which prompted the peer-to-peer. Just so your team is aware. See information below for authorization details  Authorization #: [id number] (Same as case #) Approval is valid from 03/18/23 until 05/16/23

## 2023-03-18 NOTE — Telephone Encounter (Signed)
Pet Imaging peer to peer  Case went to further clinical review, the provider could call to provide notes to approve this case.  Case# [id number]  Peer to peer: [phone]

## 2023-03-20 ENCOUNTER — Other Ambulatory Visit: Payer: Self-pay | Admitting: Internal Medicine

## 2023-03-20 ENCOUNTER — Other Ambulatory Visit (HOSPITAL_COMMUNITY): Payer: Self-pay

## 2023-03-20 MED ORDER — LISINOPRIL-HYDROCHLOROTHIAZIDE 20-12.5 MG PO TABS
1.0000 | ORAL_TABLET | Freq: Every day | ORAL | 1 refills | Status: DC
  Filled 2023-03-20: qty 90, 90d supply, fill #0
  Filled 2023-04-24 – 2023-07-16 (×3): qty 90, 90d supply, fill #1

## 2023-03-25 ENCOUNTER — Other Ambulatory Visit: Payer: Self-pay

## 2023-03-25 ENCOUNTER — Other Ambulatory Visit (HOSPITAL_COMMUNITY): Payer: Self-pay

## 2023-03-25 ENCOUNTER — Other Ambulatory Visit: Payer: Self-pay | Admitting: General Surgery

## 2023-03-25 ENCOUNTER — Other Ambulatory Visit: Payer: Self-pay | Admitting: Internal Medicine

## 2023-03-25 DIAGNOSIS — M0579 Rheumatoid arthritis with rheumatoid factor of multiple sites without organ or systems involvement: Secondary | ICD-10-CM | POA: Diagnosis not present

## 2023-03-25 DIAGNOSIS — R5381 Other malaise: Secondary | ICD-10-CM | POA: Diagnosis not present

## 2023-03-25 DIAGNOSIS — Z809 Family history of malignant neoplasm, unspecified: Secondary | ICD-10-CM | POA: Diagnosis not present

## 2023-03-25 DIAGNOSIS — D3A8 Other benign neuroendocrine tumors: Secondary | ICD-10-CM | POA: Diagnosis not present

## 2023-03-25 DIAGNOSIS — C7A8 Other malignant neuroendocrine tumors: Secondary | ICD-10-CM

## 2023-03-25 DIAGNOSIS — F17200 Nicotine dependence, unspecified, uncomplicated: Secondary | ICD-10-CM | POA: Diagnosis not present

## 2023-03-25 DIAGNOSIS — Z86711 Personal history of pulmonary embolism: Secondary | ICD-10-CM | POA: Diagnosis not present

## 2023-03-25 DIAGNOSIS — D849 Immunodeficiency, unspecified: Secondary | ICD-10-CM | POA: Diagnosis not present

## 2023-03-25 DIAGNOSIS — M059 Rheumatoid arthritis with rheumatoid factor, unspecified: Secondary | ICD-10-CM

## 2023-03-25 MED ORDER — HYDROXYCHLOROQUINE SULFATE 200 MG PO TABS
400.0000 mg | ORAL_TABLET | Freq: Every day | ORAL | 0 refills | Status: AC
  Filled 2023-03-25 – 2023-05-16 (×3): qty 180, 90d supply, fill #0

## 2023-03-25 NOTE — Telephone Encounter (Signed)
Last Fill: 01/17/2023  Eye exam: not on file   Labs: 01/17/2023 His CRP is 15.2 this is worsened from 9.23 months ago and 6.0 earlier this year.  His blood count and metabolic panel are normal no problem with the medications. I cannot recommend an increase to the prednisone dose or injections right now because his blood pressure is already uncontrolled and this would probably make it worse  I recommend he can try increasing the methotrexate dose to 8 tablets weekly. (If he already picked up the prescription we can enter a no print dose adjustment just to document this otherwise we can send a new prescription at the correct amount 8 tablets weekly). If there is not more improvement by our next follow-up we may have to switch away from the Enbrel to a different drug.  Next Visit: 04/22/2023  Last Visit: 01/17/2023  [id number] rheumatoid arthritis   Current Dose per office note 01/17/2023: hydroxychloroquine 400 mg daily.   Contacted the patient regarding his PLQ eye exam. Patient states he just recently had one done at [facility]. Advised the patient to have [facility] fax [place] the results.   Okay to refill Plaquenil?

## 2023-04-01 ENCOUNTER — Other Ambulatory Visit (HOSPITAL_COMMUNITY): Payer: Self-pay

## 2023-04-04 ENCOUNTER — Encounter (HOSPITAL_COMMUNITY): Payer: Medicaid Other

## 2023-04-05 ENCOUNTER — Encounter (HOSPITAL_COMMUNITY)
Admission: RE | Admit: 2023-04-05 | Discharge: 2023-04-05 | Disposition: A | Discharge status: Home / Self Care | Payer: Medicaid Other | Source: Ambulatory Visit | Attending: Internal Medicine | Admitting: Internal Medicine

## 2023-04-05 DIAGNOSIS — R918 Other nonspecific abnormal finding of lung field: Secondary | ICD-10-CM | POA: Diagnosis not present

## 2023-04-05 DIAGNOSIS — C7A8 Other malignant neuroendocrine tumors: Secondary | ICD-10-CM | POA: Insufficient documentation

## 2023-04-05 MED ORDER — COPPER CU 64 DOTATATE 1 MCI/ML IV SOLN
4.0000 | Freq: Once | INTRAVENOUS | Status: AC
Start: 2023-04-05 — End: 2023-04-05
  Administered 2023-04-05: 4.195 via INTRAVENOUS

## 2023-04-09 NOTE — Therapy (Signed)
OUTPATIENT PHYSICAL THERAPY EVALUATION   Patient Name: Jeremiah Alexander MRN: [id number] DOB:1970-10-25, 52 y.o., male Today's Date: 04/10/2023  END OF SESSION:  PT End of Session - 04/10/23 0928     Visit Number 1    Number of Visits 9    Date for PT Re-Evaluation 06/05/23    Authorization Type medicaid healthy blue    Authorization Time Period auth tbd    PT Start Time 0930    PT Stop Time 1010    PT Time Calculation (min) 40 min    Activity Tolerance Patient tolerated treatment well    Behavior During Therapy WFL for tasks assessed/performed             Past Medical History:  Diagnosis Date   Diabetes mellitus without complication (HCC)    GERD (gastroesophageal reflux disease)    High cholesterol    Hypertension    RA (rheumatoid arthritis) (HCC)    Past Surgical History:  Procedure Laterality Date   TOE AMPUTATION     Right Pinky Toe   Patient Active Problem List   Diagnosis Date Noted   Acid reflux 10/26/2022   Pain in left ankle and joints of left foot 10/16/2022   Other bilateral secondary osteoarthritis of knee 08/14/2022   Depression 08/07/2022   High risk medication use 05/31/2022   BP (high blood pressure) 05/31/2022   Compulsive tobacco user syndrome 05/31/2022   HLD (hyperlipidemia) 05/31/2022   Infection of the upper respiratory tract 05/31/2022   Seropositive rheumatoid arthritis (HCC) 03/09/2022   Preventative health care 03/09/2022   Pulmonary embolism without acute cor pulmonale (HCC) 02/22/2022   Pulmonary embolism (HCC) 02/22/2022   TOBACCO ABUSE 06/05/2010   Diabetes (HCC) 05/08/2010   OBESITY 05/08/2010   HYPERTENSION, BENIGN ESSENTIAL 05/08/2010    PCP: Kathleen Lime, MD  REFERRING PROVIDER: Almond Lint, MD  REFERRING DIAG: R81.81 (ICD-10-CM) - Other malaise M05.79 (ICD-10-CM) - Rheumatoid arthritis with rheumatoid factor of multiple sites without organ or systems involvement  Rationale for Evaluation and Treatment:  Rehabilitation  THERAPY DIAG:  Muscle weakness (generalized)  Other abnormalities of gait and mobility  ONSET DATE: over a year ago  SUBJECTIVE:                                                                                                                                                                                           SUBJECTIVE STATEMENT: Pt endorses functional mobility deficits worsening over past couple of years - began with difficulty walking backwards, grip weakness. Eventually got diagnosed with RA. Pt endorses difficulty with daily tasks and functional mobility, did start a new medication  which he states has led to him being able to do a bit more around the house. He states he has had LE swelling over past two years, gets spasms and shooting pain in L LE at times. Symptoms tend to fluctuate.  R hand dominant  PERTINENT HISTORY:  DM, GERD, HTN, RA, R toe amputation, hx PE, depression  PAIN:  Are you having pain: 4/10 Location/description: L LE, R wrist - aggravating factors: transfers, walking, squatting  - Easing factors: medication, rest  PRECAUTIONS: fall, hx PE   WEIGHT BEARING RESTRICTIONS: No  FALLS:  Has patient fallen in last 6 months? Yes. 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  DIAGNOSTIC FINDINGS:  NA  PATIENT SURVEYS:  FOTO 40 > 53  COGNITION: Overall cognitive status: Within functional limits for tasks assessed     SENSATION: NT  POSTURE: lateral trunk  shift to L in standing and sitting   RANGE OF MOTION:     Active  Right eval Left eval  Shoulder flexion >140 grossly ~100 deg limited by pain/stiffness  Functional ER combo    Functional IR combo    Knee extension    Ankle dorsiflexion     (Blank rows = not tested) (Key: WFL = within functional limits not formally assessed, * = concordant pain, s = stiffness/stretching sensation, NT = not tested)  Comments:    STRENGTH TESTING:  MMT Right eval Left eval  Shoulder flexion    Shoulder abduction    Elbow flexion    Elbow extension    Grip strength (gross) 5# * 75#  Hip flexion 4 4-  Hip abduction (modified sitting) 4+ 4+  Knee flexion 4 4  Knee extension 4 4  Ankle dorsiflexion 4 4- *  Ankle plantarflexion     (Blank rows = not tested) (Key: WFL = within functional limits not formally assessed, * = concordant pain, s = stiffness/stretching sensation, NT = not tested)  Comments:     FUNCTIONAL TESTS:  5xSTS: D/c at 4 reps due to knee pain, heavy LUE support, weight shift to L  TUG: 15.49sec no AD  GAIT: Distance walked: within clinic Assistive device utilized: None Level of assistance: Complete Independence Comments: antalgic gait LLE, increased lateral trunk sway BIL, reduced arm swing, mildly guarded UE posture  TODAY'S TREATMENT:  [facility] Adult PT Treatment:                                                DATE: 04/10/23 Therapeutic Exercise: Seated march, LAQ demonstrative/practice reps, HEP handout + education    PATIENT EDUCATION:  Education details: Pt education on PT impairments, prognosis, and POC. Informed consent. Rationale for interventions, safe/appropriate HEP performance Person educated: Patient Education method: Explanation, Demonstration, Tactile cues, Verbal cues, and Handouts Education comprehension: verbalized understanding,  returned demonstration, verbal cues required, tactile cues required, and needs further education    HOME EXERCISE PROGRAM: Access Code: [email or web address] URL: [email or web address] Date: 04/10/2023 Prepared by: Fransisco Hertz  Exercises - Seated March  - 2-3 x daily - 1 sets - 5 reps - Seated Long Arc Quad  - 2-3 x daily - 1 sets - 5 reps  ASSESSMENT:  CLINICAL IMPRESSION: Patient is a pleasant 52 y.o. gentleman who was seen today for physical therapy evaluation and treatment for deconditioning/weakness. He endorses chronic limitations in daily tasks/mobility in context of RA diagnosis about a year ago per pt. On exam he demonstrates mild weakness throughout BIL LE although nonpainful, most deficit at L hip. Has mobility/strength deficits of BIL UE as well. TUG time is indicative of fall risk and demonstrates gait impairments as above. 5xSTS is unable to be fully completed due to L knee pain but time of 23sec would fall within fall risk category. Pt reports transient pain with activity during session but returns to baseline with rest, no adverse events. Does well with HEP performance in clinic. Recommend trial of skilled PT to address aforementioned deficits with aim of improving functional tolerance and reducing pain with typical activities. Pt departs today's session in no acute distress, all voiced concerns/questions addressed appropriately from PT perspective.    OBJECTIVE IMPAIRMENTS: Abnormal gait, decreased activity tolerance, decreased balance, decreased endurance, decreased mobility, difficulty walking, decreased ROM, decreased strength, impaired UE functional use, improper body mechanics, postural dysfunction, and pain.   ACTIVITY LIMITATIONS: carrying, lifting, bending, standing, squatting, stairs, transfers, and locomotion level  PARTICIPATION LIMITATIONS: meal prep, cleaning, laundry, driving, community activity, and occupation  PERSONAL FACTORS: Time since onset of  injury/illness/exacerbation and 3+ comorbidities: DM, HTN, RA, R toe amputation, hx PE, depression  are also affecting patient's functional outcome.   REHAB POTENTIAL: Fair given chronicity and comorbidities  CLINICAL DECISION MAKING: Stable/uncomplicated  EVALUATION COMPLEXITY: Low   GOALS: Goals reviewed with patient? Yes  SHORT TERM GOALS: Target date: 05/08/2023 Pt will demonstrate appropriate understanding and performance of initially prescribed HEP in order to facilitate improved independence with management of symptoms.  Baseline: HEP provided on eval Goal status: INITIAL   2. Pt will score greater than or equal to 46 on FOTO in order to demonstrate improved perception of function due to symptoms.  Baseline: 40  Goal status: INITIAL    LONG TERM GOALS: Target date: 06/05/2023 Pt will score 53 on FOTO in order to demonstrate improved perception of functional status due to symptoms.  Baseline: 40 Goal status: INITIAL  2.  Pt will demonstrate bilateral hip flexion MMT of at least 4+/5 in order to facilitate improved functional strength and gait mechanics.  Baseline: see MMT chart above  Goal status: INITIAL   3.  Pt will be able to perform TUG in less than or equal to 13 sec in order to  indicate reduced risk of falling (cutoff score for fall risk 13.5 sec in community dwelling older adults per [facility] et al, 2000)  Baseline: 15.49sec  Goal status: INITIAL    4. Pt will perform 5xSTS in <20 sec in order to demonstrate reduced fall risk and improved functional independence. (MCID of 2.3sec)  Baseline: 4 repetitions in 23 seconds  Goal status: INITIAL   5. Pt will demonstrate appropriate performance of final prescribed HEP in order to facilitate improved self-management of symptoms post-discharge.   Baseline: initial HEP prescribed  Goal status: INITIAL     6. Pt will demonstrate RUE grip strength of at least 35# in order to demonstrate improved functional  strengthening.  Baseline: see MMT chart above  Goal status: INITIAL  PLAN:  PT FREQUENCY: 1x/week  PT DURATION: 8 weeks  PLANNED INTERVENTIONS: 97164- PT Re-evaluation, 97110-Therapeutic exercises, 97530- Therapeutic activity, [id number]- Neuromuscular re-education, 97535- Self Care, 52841- Manual therapy, [id number]- Gait training, [phone]- Aquatic Therapy, Patient/Family education, Balance training, Stair training, Taping, Cryotherapy, and Moist heat.  PLAN FOR NEXT SESSION: Review/update HEP PRN. Work on [employer] exercises as appropriate with emphasis on functional mobility/mechanics, balance, LE strengthening. Symptom modification strategies as indicated/appropriate.    Ashley Murrain PT, DPT 04/10/2023 2:17 PM    For all possible CPT codes, reference the Planned Interventions line above.     Check all conditions that are expected to impact treatment: {Conditions expected to impact treatment:Musculoskeletal disorders   If treatment provided at initial evaluation, no treatment charged due to lack of authorization.

## 2023-04-10 ENCOUNTER — Ambulatory Visit: Payer: Medicaid Other | Attending: General Surgery | Admitting: Physical Therapy

## 2023-04-10 ENCOUNTER — Encounter: Payer: Self-pay | Admitting: Physical Therapy

## 2023-04-10 DIAGNOSIS — M6281 Muscle weakness (generalized): Secondary | ICD-10-CM | POA: Diagnosis not present

## 2023-04-10 DIAGNOSIS — R2689 Other abnormalities of gait and mobility: Secondary | ICD-10-CM | POA: Insufficient documentation

## 2023-04-12 ENCOUNTER — Other Ambulatory Visit: Payer: Self-pay | Admitting: Internal Medicine

## 2023-04-12 DIAGNOSIS — C7A8 Other malignant neuroendocrine tumors: Secondary | ICD-10-CM

## 2023-04-12 NOTE — Progress Notes (Signed)
Spoke to the patient about the results of his PET CT scan, which showed radiotracer uptake in the area of his known ileal NET. He does have some increased tracer uptake in the left external iliac and left inguinal lymph nodes. There are no clear signs of metastasis. I have sent these results to Dr. Donell Beers to determine if he would be a reasonable surgical candidate.

## 2023-04-12 NOTE — Progress Notes (Signed)
Received a message from Dr. 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  Spoke to Dr. Bradly Chris with radiology, who stated that the left external iliac and left inguinal lymph nodes should easily be able to be targeted with ultrasound-guided biopsy.  I spoke to the patient and he is agreeable to getting the ultrasound-guided biopsy of his left external iliac and left inguinal lymph nodes.  Placed order for ultrasound-guided biopsy

## 2023-04-15 ENCOUNTER — Ambulatory Visit: Payer: Medicaid Other | Admitting: Physical Therapy

## 2023-04-15 ENCOUNTER — Encounter: Payer: Self-pay | Admitting: Physical Therapy

## 2023-04-15 DIAGNOSIS — R2689 Other abnormalities of gait and mobility: Secondary | ICD-10-CM

## 2023-04-15 DIAGNOSIS — M6281 Muscle weakness (generalized): Secondary | ICD-10-CM | POA: Diagnosis not present

## 2023-04-15 NOTE — Progress Notes (Signed)
Simonne Come, MD  Claudean Kinds OK for L inguinal LN Bx.  PET CT image 215, series 604.  Sedation per pt request.  Nedra Hai       Previous Messages    ----- Message ----- From: Claudean Kinds Sent: 04/12/2023   2:06 PM EST To: Claudean Kinds; Ir Procedure Requests Subject: [place] Core Biopsy ( lymph nodes)                  Procedure : [place] core biopsy ( lymph nodes)  Reason: Positive PET/CT for left inguinal LNs and left external iliac LNs. Would like biopsies to rule out metastasis from ileal NET Dx: Neuroendocrine carcinoma, unknown primary site (HCC) [C7A.8 (ICD-10-CM)]    History: PET  Provider:Dorsey, Orlie Dakin, MD  Provider contact :  [phone]

## 2023-04-15 NOTE — Therapy (Signed)
OUTPATIENT PHYSICAL THERAPY TREATMENT   Patient Name: Jeremiah Alexander MRN: [id number] DOB:03-09-1971, 52 y.o., male Today's Date: 04/15/2023  END OF SESSION:  PT End of Session - 04/15/23 1145     Visit Number 2    Number of Visits 9    Date for PT Re-Evaluation 06/05/23    Authorization Type medicaid healthy blue    Authorization Time Period auth tbd    PT Start Time 1145    PT Stop Time 1223    PT Time Calculation (min) 38 min             Past Medical History:  Diagnosis Date   Diabetes mellitus without complication (HCC)    GERD (gastroesophageal reflux disease)    High cholesterol    Hypertension    RA (rheumatoid arthritis) (HCC)    Past Surgical History:  Procedure Laterality Date   TOE AMPUTATION     Right Pinky Toe   Patient Active Problem List   Diagnosis Date Noted   Acid reflux 10/26/2022   Pain in left ankle and joints of left foot 10/16/2022   Other bilateral secondary osteoarthritis of knee 08/14/2022   Depression 08/07/2022   High risk medication use 05/31/2022   BP (high blood pressure) 05/31/2022   Compulsive tobacco user syndrome 05/31/2022   HLD (hyperlipidemia) 05/31/2022   Infection of the upper respiratory tract 05/31/2022   Seropositive rheumatoid arthritis (HCC) 03/09/2022   Preventative health care 03/09/2022   Pulmonary embolism without acute cor pulmonale (HCC) 02/22/2022   Pulmonary embolism (HCC) 02/22/2022   TOBACCO ABUSE 06/05/2010   Diabetes (HCC) 05/08/2010   OBESITY 05/08/2010   HYPERTENSION, BENIGN ESSENTIAL 05/08/2010    PCP: Kathleen Lime, MD  REFERRING PROVIDER: Almond Lint, MD  REFERRING DIAG: R61.81 (ICD-10-CM) - Other malaise M05.79 (ICD-10-CM) - Rheumatoid arthritis with rheumatoid factor of multiple sites without organ or systems involvement  Rationale for Evaluation and Treatment: Rehabilitation  THERAPY DIAG:  Muscle weakness (generalized)  Other abnormalities of gait and mobility  ONSET DATE:  over a year ago  SUBJECTIVE:                                                                                                                                                                                           SUBJECTIVE STATEMENT: Left ankle, bilat knees, right wrist, left shoulder , I feel all of those things today. Yesterday the ankle was worse, had trouble walking down a hill.    Pt endorses functional mobility deficits worsening over past couple of years - began with difficulty walking backwards, grip weakness. Eventually got diagnosed with RA. Pt endorses difficulty  with daily tasks and functional mobility, did start a new medication which he states has led to him being able to do a bit more around the house. He states he has had LE swelling over past two years, gets spasms and shooting pain in L LE at times. Symptoms tend to fluctuate.  R hand dominant  PERTINENT HISTORY:  DM, GERD, HTN, RA, R toe amputation, hx PE, depression  PAIN:  Are you having pain: 4/10 Location/description: L LE, R wrist - aggravating factors: transfers, walking, squatting  - Easing factors: medication, rest  PRECAUTIONS: fall, hx PE   WEIGHT BEARING RESTRICTIONS: No  FALLS:  Has patient fallen in last 6 months? Yes. 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  DIAGNOSTIC FINDINGS:  NA  PATIENT SURVEYS:  FOTO 40 > 53  COGNITION: Overall cognitive status: Within functional limits for tasks  assessed     SENSATION: NT  POSTURE: lateral trunk shift to L in standing and sitting   RANGE OF MOTION:     Active  Right eval Left eval  Shoulder flexion >140 grossly ~100 deg limited by pain/stiffness  Functional ER combo    Functional IR combo    Knee extension    Ankle dorsiflexion     (Blank rows = not tested) (Key: WFL = within functional limits not formally assessed, * = concordant pain, s = stiffness/stretching sensation, NT = not tested)  Comments:    STRENGTH TESTING:  MMT Right eval Left eval  Shoulder flexion    Shoulder abduction    Elbow flexion    Elbow extension    Grip strength (gross) 5# * 75#  Hip flexion 4 4-  Hip abduction (modified sitting) 4+ 4+  Knee flexion 4 4  Knee extension 4 4  Ankle dorsiflexion 4 4- *  Ankle plantarflexion     (Blank rows = not tested) (Key: WFL = within functional limits not formally assessed, * = concordant pain, s = stiffness/stretching sensation, NT = not tested)  Comments:     FUNCTIONAL TESTS:  5xSTS: D/c at 4 reps due to knee pain, heavy LUE support, weight shift to L  TUG: 15.49sec no AD  GAIT: Distance walked: within clinic Assistive device utilized: None Level of assistance: Complete Independence Comments: antalgic gait LLE, increased lateral trunk sway BIL, reduced arm swing, mildly guarded UE posture  TODAY'S TREATMENT:  [facility] Adult PT Treatment:                                                DATE: 04/15/23 Therapeutic Exercise: Seated March x 10 each, GTB x 10 Seated LAQ x 10 each , GTB x 10  Seated heel raises 10 x 2  Seated toe raises 10 x 2  Supine clam AROM, GTB x 10 PPT 5 sec x 10- mod cues Supine left ankle circles CW, CCW  Supine chest press with dowel Supine chest press to pullover with dowel  Finger spreading on table  Wrist and hand general exercises Towel  grip 5 sec x 10 Self Care:  Use of moist heat to ease stiffness in joints    [facility] Adult PT Treatment:                                                DATE: 04/10/23 Therapeutic Exercise: Seated march, LAQ demonstrative/practice reps, HEP handout + education    PATIENT EDUCATION:  Education details: Pt education on PT impairments, prognosis, and POC. Informed consent. Rationale for interventions, safe/appropriate HEP performance Person educated: Patient Education method: Explanation, Demonstration, Tactile cues, Verbal cues, and Handouts Education comprehension: verbalized understanding, returned demonstration, verbal cues required, tactile cues required, and needs further education    HOME EXERCISE PROGRAM: Access Code: [email or web address] URL: [email or web address] Date: 04/10/2023 Prepared by: Fransisco Hertz  Exercises - Seated March  - 2-3 x daily - 1 sets - 5 reps - Seated Long Arc Quad  - 2-3 x daily - 1 sets - 5 reps 04/15/23 - Wrist Circumduction AROM  - 1 x daily - 7 x weekly - 1 sets - 10 reps - Supine Ankle Circles  - 1 x daily - 7 x weekly - 1 sets - 10 reps - Supine Shoulder Flexion with Dowel  - 1 x daily - 7 x weekly - 1-2 sets - 10 reps - Pelvic tilt  - 1 x daily - 7 x weekly - 1 sets - 10 reps - 5 hold  ASSESSMENT:  CLINICAL IMPRESSION: Pt reports mid level, multi joint pain today, which was worse yesterday. He had difficulty walking down an incline due to his left ankle pain. Reviewed his initial HEP and added resistance band. Progressed with general mobility to address multiple joints and updated HEP. Pt reported that his wrist felt loosened up at end of session.   EVAL: Patient is a pleasant 52 y.o. gentleman who was seen today for physical therapy evaluation and treatment for deconditioning/weakness. He endorses chronic limitations in daily tasks/mobility in context of RA diagnosis about a year ago per pt. On exam he demonstrates mild weakness throughout BIL  LE although nonpainful, most deficit at L hip. Has mobility/strength deficits of BIL UE as well. TUG time is indicative of fall risk and demonstrates gait impairments as above. 5xSTS is unable to be fully completed due to L knee pain but time of 23sec would fall within fall risk category. Pt reports transient pain with activity during session but returns to baseline with rest, no adverse events. Does well with HEP performance in clinic. Recommend trial of skilled PT to address aforementioned deficits with aim of improving functional tolerance and  reducing pain with typical activities. Pt departs today's session in no acute distress, all voiced concerns/questions addressed appropriately from PT perspective.    OBJECTIVE IMPAIRMENTS: Abnormal gait, decreased activity tolerance, decreased balance, decreased endurance, decreased mobility, difficulty walking, decreased ROM, decreased strength, impaired UE functional use, improper body mechanics, postural dysfunction, and pain.   ACTIVITY LIMITATIONS: carrying, lifting, bending, standing, squatting, stairs, transfers, and locomotion level  PARTICIPATION LIMITATIONS: meal prep, cleaning, laundry, driving, community activity, and occupation  PERSONAL FACTORS: Time since onset of injury/illness/exacerbation and 3+ comorbidities: DM, HTN, RA, R toe amputation, hx PE, depression  are also affecting patient's functional outcome.   REHAB POTENTIAL: Fair given chronicity and comorbidities  CLINICAL DECISION MAKING: Stable/uncomplicated  EVALUATION COMPLEXITY: Low   GOALS: Goals reviewed with patient? Yes  SHORT TERM GOALS: Target date: 05/08/2023 Pt will demonstrate appropriate understanding and performance of initially prescribed HEP in order to facilitate improved independence with management of symptoms.  Baseline: HEP provided on eval Goal status: INITIAL   2. Pt will score greater than or equal to 46 on FOTO in order to demonstrate improved  perception of function due to symptoms.  Baseline: 40  Goal status: INITIAL    LONG TERM GOALS: Target date: 06/05/2023 Pt will score 53 on FOTO in order to demonstrate improved perception of functional status due to symptoms.  Baseline: 40 Goal status: INITIAL  2.  Pt will demonstrate bilateral hip flexion MMT of at least 4+/5 in order to facilitate improved functional strength and gait mechanics.  Baseline: see MMT chart above  Goal status: INITIAL   3.  Pt will be able to perform TUG in less than or equal to 13 sec in order to indicate reduced risk of falling (cutoff score for fall risk 13.5 sec in community dwelling older adults per [facility] et al, 2000)  Baseline: 15.49sec  Goal status: INITIAL    4. Pt will perform 5xSTS in <20 sec in order to demonstrate reduced fall risk and improved functional independence. (MCID of 2.3sec)  Baseline: 4 repetitions in 23 seconds  Goal status: INITIAL   5. Pt will demonstrate appropriate performance of final prescribed HEP in order to facilitate improved self-management of symptoms post-discharge.   Baseline: initial HEP prescribed  Goal status: INITIAL     6. Pt will demonstrate RUE grip strength of at least 35# in order to demonstrate improved functional strengthening.  Baseline: see MMT chart above  Goal status: INITIAL  PLAN:  PT FREQUENCY: 1x/week  PT DURATION: 8 weeks  PLANNED INTERVENTIONS: 97164- PT Re-evaluation, 97110-Therapeutic exercises, 97530- Therapeutic activity, [id number]- Neuromuscular re-education, 97535- Self Care, [id number]- Manual therapy, [id number]- Gait training, [phone]- Aquatic Therapy, Patient/Family education, Balance training, Stair training, Taping, Cryotherapy, and Moist heat.  PLAN FOR NEXT SESSION: Review/update HEP PRN. Work on [employer] exercises as appropriate with emphasis on functional mobility/mechanics, balance, LE strengthening. Symptom modification strategies as indicated/appropriate.    Jannette Spanner, PTA 04/15/23 12:58 PM Phone: [phone] Fax: [phone]    For all possible CPT codes, reference the Planned Interventions line above.     Check all conditions that are expected to impact treatment: {Conditions expected to impact treatment:Musculoskeletal disorders   If treatment provided at initial evaluation, no treatment charged due to lack of authorization.

## 2023-04-17 ENCOUNTER — Other Ambulatory Visit: Payer: Self-pay

## 2023-04-17 NOTE — Progress Notes (Signed)
Specialty Pharmacy Ongoing Clinical Assessment Note  Jeremiah Alexander is a 52 y.o. male who is being followed by the specialty pharmacy service for RxSp Rheumatoid Arthritis   Patient's specialty medication(s) reviewed today: Etanercept   Missed doses in the last 4 weeks: 4   Patient/Caregiver did not have any additional questions or concerns.   Therapeutic benefit summary: Patient is NOT achieving benefit   Adverse events/side effects summary: No adverse events/side effects   Patient's therapy is appropriate to: Continue  Patient recently diagnosed with colon cancer. Reviewed recent office notes. No instructions to hold Enbrel for malignancy. Patient states all providers are aware of his cancer diagnosis and that he is to remain on Enbrel but is to hold therapy prior to colon surgery in January 2025. Sent message to clinic pharmacist.     Goals Addressed             This Visit's Progress    Minimize recurrence of flares       Patient is not on track and no change. Patient will maintain adherence and work on increased adherence         Follow up:  6 months  Khalfani Weideman E Glass blower/designer

## 2023-04-17 NOTE — Progress Notes (Signed)
Specialty Pharmacy Refill Coordination Note  Jeremiah Alexander is a 52 y.o. male contacted today regarding refills of specialty medication(s) Etanercept   Patient requested [name] at [facility] at [place] date: 04/19/23   Medication will be filled on 04/17/2023.

## 2023-04-22 ENCOUNTER — Ambulatory Visit: Payer: Medicaid Other | Attending: General Surgery | Admitting: Physical Therapy

## 2023-04-22 ENCOUNTER — Encounter: Payer: Self-pay | Admitting: Physical Therapy

## 2023-04-22 ENCOUNTER — Ambulatory Visit: Payer: Medicaid Other | Admitting: Internal Medicine

## 2023-04-22 DIAGNOSIS — R2689 Other abnormalities of gait and mobility: Secondary | ICD-10-CM

## 2023-04-22 DIAGNOSIS — M6281 Muscle weakness (generalized): Secondary | ICD-10-CM

## 2023-04-22 NOTE — Therapy (Addendum)
 OUTPATIENT PHYSICAL THERAPY TREATMENT + NO VISIT DISCHARGE SUMMARY (see below)    Patient Name: Jeremiah Alexander MRN: [id number] DOB:[birth year], 52 y.o., male Today's Date: 04/22/2023  END OF SESSION:  PT End of Session - 04/22/23 0845     Visit Number 3    Number of Visits 9    Date for PT Re-Evaluation 06/05/23    Authorization Type medicaid [employer]    Authorization Time Period 9 visits 04/15/23-06/13/23    Authorization - Visit Number 2    Authorization - Number of Visits 9    PT Start Time 0845    PT Stop Time 0926    PT Time Calculation (min) 41 min    Activity Tolerance Patient tolerated treatment well    Behavior During Therapy WFL for tasks assessed/performed              Past Medical History:  Diagnosis Date   Diabetes mellitus without complication (HCC)    GERD (gastroesophageal reflux disease)    High cholesterol    Hypertension    RA (rheumatoid arthritis) (HCC)    Past Surgical History:  Procedure Laterality Date   TOE AMPUTATION     Right Pinky Toe   Patient Active Problem List   Diagnosis Date Noted   Acid reflux 10/26/2022   Pain in left ankle and joints of left foot 10/16/2022   Other bilateral secondary osteoarthritis of knee 08/14/2022   Depression 08/07/2022   High risk medication use 05/31/2022   BP (high blood pressure) 05/31/2022   Compulsive tobacco user syndrome 05/31/2022   HLD (hyperlipidemia) 05/31/2022   Infection of the upper respiratory tract 05/31/2022   Seropositive rheumatoid arthritis (HCC) 03/09/2022   Preventative health care 03/09/2022   Pulmonary embolism without acute cor pulmonale (HCC) 02/22/2022   Pulmonary embolism (HCC) 02/22/2022   TOBACCO ABUSE 06/05/2010   Diabetes (HCC) 05/08/2010   OBESITY 05/08/2010   HYPERTENSION, BENIGN ESSENTIAL 05/08/2010    PCP: Kathleen Lime, MD  REFERRING PROVIDER: Almond Lint, MD  REFERRING DIAG: R52.81 (ICD-10-CM) - Other malaise M05.79 (ICD-10-CM) - Rheumatoid  arthritis with rheumatoid factor of multiple sites without organ or systems involvement  Rationale for Evaluation and Treatment: Rehabilitation  THERAPY DIAG:  Muscle weakness (generalized)  Other abnormalities of gait and mobility  ONSET DATE: over a year ago  SUBJECTIVE:                                                                                                                                                                                          Per eval - Pt endorses functional mobility deficits worsening over past couple of years -  began with difficulty walking backwards, grip weakness. Eventually got diagnosed with RA. Pt endorses difficulty with daily tasks and functional mobility, did start a new medication which he states has led to him being able to do a bit more around the house. He states he has had LE swelling over past two years, gets spasms and shooting pain in L LE at times. Symptoms tend to fluctuate.  R hand dominant  SUBJECTIVE STATEMENT: 04/22/2023 Pt denies any soreness outside the usual after last session. R wrist bothering him a bit more this morning. Reports having done HEP a couple times since last visit given the holiday, but has been trying to move his ankle more.    PERTINENT HISTORY:  DM, GERD, HTN, RA, R toe amputation, hx PE, depression  PAIN:  Are you having pain: unrated 04/22/23 Location/description: L LE, R wrist - aggravating factors: transfers, walking, squatting  - Easing factors: medication, rest  PRECAUTIONS: fall, hx PE   WEIGHT BEARING RESTRICTIONS: No  FALLS:  Has patient fallen in last 6 months? Yes. 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  DIAGNOSTIC FINDINGS:  NA  PATIENT SURVEYS:  FOTO 40 > 53  COGNITION: Overall cognitive status: Within functional limits for tasks assessed     SENSATION: NT  POSTURE: lateral trunk shift to L in standing and sitting   RANGE OF MOTION:     Active  Right eval Left eval  Shoulder flexion >140 grossly ~100 deg limited by pain/stiffness  Functional ER combo    Functional IR combo    Knee extension    Ankle dorsiflexion     (Blank rows = not tested) (Key: WFL = within functional limits not formally assessed, * = concordant pain, s = stiffness/stretching sensation, NT = not tested)  Comments:    STRENGTH TESTING:  MMT Right eval Left eval  Shoulder flexion    Shoulder abduction    Elbow flexion    Elbow extension    Grip strength (gross) 5# * 75#  Hip flexion 4 4-  Hip abduction (modified sitting) 4+ 4+  Knee flexion 4 4  Knee extension 4 4  Ankle dorsiflexion 4 4- *  Ankle plantarflexion     (Blank rows = not tested) (Key: WFL = within functional limits not formally assessed, * = concordant pain, s = stiffness/stretching sensation, NT = not tested)  Comments:     FUNCTIONAL TESTS:  5xSTS: D/c at 4 reps due to knee pain, heavy LUE support, weight shift to L  TUG: 15.49sec no AD  GAIT: Distance walked: within clinic Assistive device utilized: None Level of assistance: Complete Independence Comments: antalgic gait LLE, increased lateral trunk sway BIL, reduced arm swing, mildly guarded UE posture  TODAY'S TREATMENT:  [facility] Adult PT Treatment:                                                DATE: 04/22/23 Therapeutic Exercise: [place] Si band march  3x10 Green band hip abduction, seated 2x10  Standing hip ext w/ UE support, 2x8 BIL  Seated scap retraction + shoulder horiz abd 2x8 cues for posture and form Supine chest press + pullover 2x8 cues for comfortable ROM  Hooklying pelvic tilt 2x10 cues for breath control Seated heel/toe raises 2x12 cues for positioning HEP update + education/handout    [facility] Adult PT Treatment:                                                DATE: 04/15/23 Therapeutic Exercise: Seated March x 10 each, GTB x 10 Seated LAQ x 10 each , GTB x 10  Seated heel raises 10 x 2  Seated toe raises 10 x 2  Supine clam AROM, GTB x 10 PPT 5 sec x 10- mod cues Supine left ankle circles CW, CCW  Supine chest press with dowel Supine chest press to pullover with dowel  Finger spreading on table  Wrist and hand general exercises Towel grip 5 sec x 10 Self Care:  Use of moist heat to ease stiffness in joints    [facility] Adult PT Treatment:                                                DATE: 04/10/23 Therapeutic Exercise: Seated march, LAQ demonstrative/practice reps, HEP handout + education    PATIENT EDUCATION:  Education details: rationale for interventions, HEP  Person educated: Patient Education method: Explanation, Demonstration, Actor cues, Verbal cues Education comprehension: verbalized understanding, returned demonstration, verbal cues required, tactile cues required, and needs further education     HOME EXERCISE PROGRAM: Access Code: [email or web address] URL: [email or web address] Date: 04/22/2023 Prepared by: Fransisco Hertz  Exercises - Seated March  - 2-3 x daily - 1 sets - 8 reps - Seated Long Arc Quad  - 2-3 x daily - 1 sets - 8 reps - Wrist Circumduction AROM  - 2-3 x daily - 1 sets - 10 reps - Supine Shoulder Flexion with Dowel  - 2-3 x daily - 1-2 sets - 10 reps - Pelvic tilt  - 2-3 x daily - 1 sets - 10 reps - 5 hold - Seated Heel Toe Raises  - 2-3 x daily - 2 sets - 10  reps  ASSESSMENT:  CLINICAL IMPRESSION: 04/22/2023 Pt arrives w/ report of no issues after last session, continues with multi joint pain most notable in L ankle and R wrist. Today continuing with familiar exercises with progression for volume, and introduction to standing exercise. No adverse events, pt limited by muscular fatigue. Denies any change in symptoms on departure, HEP update as above. Recommend continuing along current POC in order to address relevant deficits and improve functional tolerance. Pt departs today's session in no acute distress, all voiced questions/concerns addressed appropriately from PT perspective.     EVAL: Patient is a pleasant 52 y.o. gentleman who was  seen today for physical therapy evaluation and treatment for deconditioning/weakness. He endorses chronic limitations in daily tasks/mobility in context of RA diagnosis about a year ago per pt. On exam he demonstrates mild weakness throughout BIL LE although nonpainful, most deficit at L hip. Has mobility/strength deficits of BIL UE as well. TUG time is indicative of fall risk and demonstrates gait impairments as above. 5xSTS is unable to be fully completed due to L knee pain but time of 23sec would fall within fall risk category. Pt reports transient pain with activity during session but returns to baseline with rest, no adverse events. Does well with HEP performance in clinic. Recommend trial of skilled PT to address aforementioned deficits with aim of improving functional tolerance and reducing pain with typical activities. Pt departs today's session in no acute distress, all voiced concerns/questions addressed appropriately from PT perspective.    OBJECTIVE IMPAIRMENTS: Abnormal gait, decreased activity tolerance, decreased balance, decreased endurance, decreased mobility, difficulty walking, decreased ROM, decreased strength, impaired UE functional use, improper body mechanics, postural dysfunction, and pain.   ACTIVITY  LIMITATIONS: carrying, lifting, bending, standing, squatting, stairs, transfers, and locomotion level  PARTICIPATION LIMITATIONS: meal prep, cleaning, laundry, driving, community activity, and occupation  PERSONAL FACTORS: Time since onset of injury/illness/exacerbation and 3+ comorbidities: DM, HTN, RA, R toe amputation, hx PE, depression  are also affecting patient's functional outcome.   REHAB POTENTIAL: Fair given chronicity and comorbidities  CLINICAL DECISION MAKING: Stable/uncomplicated  EVALUATION COMPLEXITY: Low   GOALS: Goals reviewed with patient? Yes  SHORT TERM GOALS: Target date: 05/08/2023 Pt will demonstrate appropriate understanding and performance of initially prescribed HEP in order to facilitate improved independence with management of symptoms.  Baseline: HEP provided on eval Goal status: INITIAL   2. Pt will score greater than or equal to 46 on FOTO in order to demonstrate improved perception of function due to symptoms.  Baseline: 40  Goal status: INITIAL    LONG TERM GOALS: Target date: 06/05/2023 Pt will score 53 on FOTO in order to demonstrate improved perception of functional status due to symptoms.  Baseline: 40 Goal status: INITIAL  2.  Pt will demonstrate bilateral hip flexion MMT of at least 4+/5 in order to facilitate improved functional strength and gait mechanics.  Baseline: see MMT chart above  Goal status: INITIAL   3.  Pt will be able to perform TUG in less than or equal to 13 sec in order to indicate reduced risk of falling (cutoff score for fall risk 13.5 sec in community dwelling older adults per [facility] et al, 2000)  Baseline: 15.49sec  Goal status: INITIAL    4. Pt will perform 5xSTS in <20 sec in order to demonstrate reduced fall risk and improved functional independence. (MCID of 2.3sec)  Baseline: 4 repetitions in 23 seconds  Goal status: INITIAL   5. Pt will demonstrate appropriate performance of final prescribed HEP in order  to facilitate improved self-management of symptoms post-discharge.   Baseline: initial HEP prescribed  Goal status: INITIAL     6. Pt will demonstrate RUE grip strength of at least 35# in order to demonstrate improved functional strengthening.  Baseline: see MMT chart above  Goal status: INITIAL  PLAN:  PT FREQUENCY: 1x/week  PT DURATION: 8 weeks  PLANNED INTERVENTIONS: 97164- PT Re-evaluation, 97110-Therapeutic exercises, 97530- Therapeutic activity, [id number]- Neuromuscular re-education, 97535- Self Care, [id number]- Manual therapy, [id number]- Gait training, [phone]- Aquatic Therapy, Patient/Family education, Balance training, Stair training, Taping, Cryotherapy, and Moist heat.  PLAN FOR NEXT  SESSION: Review/update HEP PRN. Work on [employer] exercises as appropriate with emphasis on functional mobility/mechanics, balance, LE strengthening. Symptom modification strategies as indicated/appropriate.    Ashley Murrain PT, DPT 04/22/2023 9:43 AM    For all possible CPT codes, reference the Planned Interventions line above.     Check all conditions that are expected to impact treatment: {Conditions expected to impact treatment:Musculoskeletal disorders   If treatment provided at initial evaluation, no treatment charged due to lack of authorization.       Discharge addendum 08/07/2023  PHYSICAL THERAPY DISCHARGE SUMMARY  Visits from Start of Care: 3  Current functional level related to goals / functional outcomes: Unable to be assessed   Remaining deficits: Unable to be assessed   Education / Equipment: Unable to be assessed  Patient goals were unable to be assessed. Patient is being discharged due to not returning since the last visit.  Ashley Murrain PT, DPT 08/07/2023 1:45 PM

## 2023-04-24 ENCOUNTER — Other Ambulatory Visit: Payer: Self-pay

## 2023-04-24 ENCOUNTER — Other Ambulatory Visit (HOSPITAL_COMMUNITY): Payer: Self-pay

## 2023-04-29 ENCOUNTER — Encounter: Payer: Self-pay | Admitting: Genetic Counselor

## 2023-05-01 ENCOUNTER — Inpatient Hospital Stay: Payer: Medicaid Other

## 2023-05-01 ENCOUNTER — Other Ambulatory Visit: Payer: Self-pay

## 2023-05-01 ENCOUNTER — Inpatient Hospital Stay: Payer: Medicaid Other | Attending: Genetic Counselor | Admitting: Genetic Counselor

## 2023-05-01 ENCOUNTER — Encounter: Payer: Self-pay | Admitting: Genetic Counselor

## 2023-05-01 ENCOUNTER — Other Ambulatory Visit: Payer: Self-pay | Admitting: Genetic Counselor

## 2023-05-01 DIAGNOSIS — Z8042 Family history of malignant neoplasm of prostate: Secondary | ICD-10-CM | POA: Insufficient documentation

## 2023-05-01 DIAGNOSIS — Z800 Family history of malignant neoplasm of digestive organs: Secondary | ICD-10-CM | POA: Diagnosis not present

## 2023-05-01 DIAGNOSIS — Z803 Family history of malignant neoplasm of breast: Secondary | ICD-10-CM | POA: Insufficient documentation

## 2023-05-01 DIAGNOSIS — D3A8 Other benign neuroendocrine tumors: Secondary | ICD-10-CM | POA: Insufficient documentation

## 2023-05-01 HISTORY — DX: Other benign neuroendocrine tumors: D3A.8

## 2023-05-01 LAB — GENETIC SCREENING ORDER

## 2023-05-01 NOTE — Progress Notes (Signed)
REFERRING PROVIDER: Almond Lint, MD [address] [place],  [place] [address]  PRIMARY PROVIDER:  Kathleen Lime, MD  PRIMARY REASON FOR VISIT:  1. Family history of breast cancer   2. Family history of prostate cancer   3. Family history of colon cancer   4. Neuroendocrine tumor      HISTORY OF PRESENT ILLNESS:   [name], a 52 y.o. male, was seen for a [place] cancer genetics consultation at the request of Dr. Donell Beers due to a personal history of a neuroendocrine tumor and a family history of breast and prostate cancer.  Mr. Papale presents to clinic today to discuss the possibility of a hereditary predisposition to cancer, genetic testing, and to further clarify his future cancer risks, as well as potential cancer risks for family members.   In November 2024, at the age of 18, Mr. 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 The treatment plan includes surgery.      CANCER HISTORY:  Oncology History   No history exists.     Past Medical History:  Diagnosis Date   Diabetes mellitus without complication (HCC)    Family history of breast cancer    Family history of colon cancer    Family history of prostate cancer    GERD (gastroesophageal reflux disease)    High cholesterol    Hypertension    Neuroendocrine tumor 05/01/2023   RA (rheumatoid arthritis) (HCC)     Past Surgical History:  Procedure Laterality Date   TOE AMPUTATION     Right Pinky Toe    Social History   Socioeconomic History   Marital status: Single    Spouse name: Not on file   Number of children: 1   Years of education: Not on file   Highest education level: Not on file  Occupational History   Occupation: disablity  Tobacco Use   Smoking status: Every Day    Current packs/day: 0.25    Average packs/day: 0.3 packs/day for 30.0 years (7.5 ttl pk-yrs)    Types: Cigarettes    Passive exposure: Current   Smokeless tobacco: Never  Vaping  Use   Vaping status: Never Used  Substance and Sexual Activity   Alcohol use: No   Drug use: Not Currently   Sexual activity: Yes    Birth control/protection: Condom  Other Topics Concern   Not on file  Social History Narrative   Not on file   Social Determinants of Health   Financial Resource Strain: Medium Risk (08/13/2022)   Overall Financial Resource Strain (CARDIA)    Difficulty of Paying Living Expenses: Somewhat hard  Food Insecurity: No Food Insecurity (02/23/2022)   Hunger Vital Sign    Worried About Running Out of Food in the Last Year: Never true    Ran Out of Food in the Last Year: Never true  Transportation Needs: No Transportation Needs (02/23/2022)   PRAPARE - Administrator, Civil Service (Medical): No    Lack of Transportation (Non-Medical): No  Physical Activity: Not on file  Stress: Not on file  Social Connections: Not on file     FAMILY HISTORY:  We obtained a detailed, 4-generation family history.  Significant diagnoses are listed below: Family History  Problem Relation Age of Onset   Rheum arthritis Mother    Breast cancer Maternal Aunt        dx. > 50   Throat cancer Maternal Uncle  smoker   Prostate cancer Maternal Uncle    Prostate cancer Maternal Uncle    Breast cancer Maternal Grandmother    Diabetes Other    Breast cancer Other        MGF's sister dx over 110   Breast cancer Half-Sister 47   Colon cancer Half-Sister 62   Breast cancer Maternal Great-grandmother        MGF's mother dx over 61   Esophageal cancer Neg Hx    Rectal cancer Neg Hx    Stomach cancer Neg Hx      The patient has one daughter who is cancer free.  He has two maternal half sisters, one sister had breast cancer at 33 and colon cancer at 18 and reportedly tested negative for BRCA1 and 2.  His father is deceased and his mother is living.  There is no information on the paternal side of the family.  The patient's mother is living at 33.  She had four  brothers and a sister. Her sister developed breast cancer and is deceased, two brothers have prostate cancer and one brother died of throat cancer.  Both of her parents are deceased.  Her father had a sister with breast cancer and his mother also had breast cancer.  Mr. Bayless is aware of previous family history of genetic testing for hereditary cancer risks.  There is no reported Ashkenazi Jewish ancestry. There is no known consanguinity.  GENETIC COUNSELING ASSESSMENT: Mr. Francke is a 52 y.o. male with a personal and family history of cancer which is somewhat suggestive of a hereditary cancer syndrome and predisposition to cancer given the number of women in the family with breast cancer and the combination of breast and prostate cancer. We, therefore, discussed and recommended the following at today's visit.   DISCUSSION: We discussed that, in general, most cancer is not inherited in families, but instead is sporadic or familial. Sporadic cancers occur by chance and typically happen at older ages (>50 years) as this type of cancer is caused by genetic changes acquired during an individual's lifetime. Some families have more cancers than would be expected by chance; however, the ages or types of cancer are not consistent with a known genetic mutation or known genetic mutations have been ruled out. This type of familial cancer is thought to be due to a combination of multiple genetic, environmental, hormonal, and lifestyle factors. While this combination of factors likely increases the risk of cancer, the exact source of this risk is not currently identifiable or testable.  We discussed that 5 - 10% of breast cancer is hereditary, with most cases associated with BRCA mutations.  There are other genes that can be associated with hereditary breast cancer syndromes.  These include ATM, CHEK2 and PALB2.  We discussed that while there are times that neuroendocrine tumors can be hereditary, they are less commonly  hereditary.  The patient's family history of breast and prostate cancer is more concerning for a hereditary condition.  We discussed that testing is beneficial for several reasons including knowing how to follow individuals after completing their treatment, identifying whether potential treatment options such as PARP inhibitors would be beneficial, and understand if other family members could be at risk for cancer and allow them to undergo genetic testing.   We reviewed the characteristics, features and inheritance patterns of hereditary cancer syndromes. We also discussed genetic testing, including the appropriate family members to test, the process of testing, insurance coverage and turn-around-time for results. We discussed  the implications of a negative, positive, carrier and/or variant of uncertain significant result. Mr. Hebel  was offered a common hereditary cancer panel (36+ genes) and an expanded pan-cancer panel (70+ genes). Mr. Lanius was informed of the benefits and limitations of each panel, including that expanded pan-cancer panels contain genes that do not have clear management guidelines at this point in time.  We also discussed that as the number of genes included on a panel increases, the chances of variants of uncertain significance increases. Mr. Grahan decided to pursue genetic testing for the [facility] Multi-cancer gene panel.   The Multi-Cancer + RNA Panel offered by [facility] includes sequencing and/or deletion/duplication analysis of the following 70 genes:  AIP*, ALK, APC*, ATM*, AXIN2*, BAP1*, BARD1*, BLM*, BMPR1A*, BRCA1*, BRCA2*, BRIP1*, CDC73*, CDH1*, CDK4, CDKN1B*, CDKN2A, CHEK2*, CTNNA1*, DICER1*, EPCAM (del/dup only), EGFR, FH*, FLCN*, GREM1 (promoter dup only), HOXB13, KIT, LZTR1, MAX*, MBD4, MEN1*, MET, MITF, MLH1*, MSH2*, MSH3*, MSH6*, MUTYH*, NF1*, NF2*, NTHL1*, PALB2*, PDGFRA, PMS2*, POLD1*, POLE*, POT1*, PRKAR1A*, PTCH1*, PTEN*, RAD51C*, RAD51D*, RB1*, RET, SDHA* (sequencing  only), SDHAF2*, SDHB*, SDHC*, SDHD*, SMAD4*, SMARCA4*, SMARCB1*, SMARCE1*, STK11*, SUFU*, TMEM127*, TP53*, TSC1*, TSC2*, VHL*. RNA analysis is performed for * genes.   Based on Mr. Vanous personal and family history of cancer, he meets medical criteria for genetic testing. Despite that he meets criteria, he may still have an out of pocket cost. We discussed that if his out of pocket cost for testing is over $100, the laboratory will call and confirm whether he wants to proceed with testing.  If the out of pocket cost of testing is less than $100 he will be billed by the genetic testing laboratory.   We discussed that some people do not want to undergo genetic testing due to fear of genetic discrimination.  The Genetic Information Nondiscrimination Act (GINA) was signed into federal law in 2008. GINA prohibits health insurers and most employers from discriminating against individuals based on genetic information (including the results of genetic tests and family history information). According to GINA, health insurance companies cannot consider genetic information to be a preexisting condition, nor can they use it to make decisions regarding coverage or rates. GINA also makes it illegal for most employers to use genetic information in making decisions about hiring, firing, promotion, or terms of employment. It is important to note that GINA does not offer protections for life insurance, disability insurance, or long-term care insurance. GINA does not apply to those in the [employer], those who work for companies with less than 15 employees, and new life insurance or long-term disability insurance policies.  Health status due to a cancer diagnosis is not protected under GINA. More information about GINA can be found by visiting [email or web address].   PLAN: After considering the risks, benefits, and limitations, Mr. Giesel provided informed consent to pursue genetic testing and the blood sample was sent to [facility] for analysis of the Multi-cancer panel + RNA. Results should be available within approximately 2-3 weeks' time, at which point they will be disclosed by telephone to Mr. Bruney, as will any additional recommendations warranted by these results. Mr. Usman will receive a summary of his genetic counseling visit and a copy of his results once available. This information will also be available in Epic.   Based on Mr. Hogle family history, we recommend trying to obtain a copy of the genetic testing performed on his sister, Annice Pih, who was diagnosed with breast and colon cancer.  The family felt that it was  unlikely that we would be able to obtain a copy of this, but they will check.   Lastly, we encouraged Mr. Quickle to remain in contact with cancer genetics annually so that we can continuously update the family history and inform him of any changes in cancer genetics and testing that may be of benefit for this family.   Mr. Thalman questions were answered to his satisfaction today. Our contact information was provided should additional questions or concerns arise. Thank you for the referral and allowing [place] to share in the care of your patient.   Nahara Dona P. Lowell Guitar, MS, [facility] Licensed, Patent attorney [email or web address] phone: [phone]  The patient was seen for a total of 60 minutes in face-to-face genetic counseling.  The patient brought his sister, French Ana, and mother, Toney Reil. Drs. Meliton Rattan, and/or [place] were available for questions, if needed..    _______________________________________________________________________ For Office Staff:  Number of people involved in session: 3 Was an Intern/ student involved with case: no

## 2023-05-02 ENCOUNTER — Ambulatory Visit: Payer: Medicaid Other | Admitting: Physical Therapy

## 2023-05-03 ENCOUNTER — Inpatient Hospital Stay: Payer: Medicaid Other | Admitting: *Deleted

## 2023-05-03 ENCOUNTER — Ambulatory Visit (HOSPITAL_COMMUNITY): Payer: Medicaid Other

## 2023-05-03 NOTE — Progress Notes (Signed)
[facility] CSW Progress Note  Visual merchandiser met with patient briefly during genetic counseling visit earlier to this week. Patient and family requested information on transportation resources and general case management/counseling support.  CSW briefly discussed resources and agreed to follow up with patient with external supportive care options, as patient will not be followed at cancer center at this time.   Clinical Social Worker contacted patient by phone.  CSW provided information for [employer] transportation through [place] [phone]). CSW encouraged patient to contact his PCP for general case management/counseling ongoing support.    Polo Riley, LCSW Clinical Social Worker [facility]    Patient is participating in a Managed Medicaid Plan:  Yes

## 2023-05-06 ENCOUNTER — Other Ambulatory Visit (HOSPITAL_COMMUNITY): Payer: Self-pay

## 2023-05-06 NOTE — Progress Notes (Deleted)
Office Visit Note  Patient: Jeremiah Alexander             Date of Birth: 05/13/1971           MRN: [id number]             PCP: Kathleen Lime, MD Referring: Kathleen Lime, MD Visit Date: 05/16/2023   Subjective:  No chief complaint on file.   History of Present Illness: Jeremiah Alexander is a 52 y.o. male here for follow up for seropositive RA on Enbrel 50 mg subcu weekly, hydroxychloroquine 400 mg daily, prednisone 5 mg daily, and methotrexate 20 mg p.o. weekly and folic acid 1 mg daily.    Previous HPI 01/17/2023 Jeremiah Alexander is a 52 y.o. male here for follow up for seropositive RA on Enbrel 50 mg subcu weekly and hydroxychloroquine 400 mg daily prednisone 5 mg daily since last visit resuming the methotrexate 15 mg p.o. weekly folic acid 1 mg daily.  Despite this he continues with significant joint pain and stiffness currently most problematic areas in his right wrist and left ankle.  He has increased pain and swelling after walking or putting pressure onto his left ankle.  Is also describing increased trouble rising from a seated position has to use his hands for assistance due to weakness.  Has to watch his feet while walking as his ankle feels unstable and painful if he steps on any uneven ground.  Denies left leg numbness or pain radiating in the back of the leg.  He does have neuropathy not on specific medications for this.  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   Previous HPI 10/16/2022 Jeremiah Alexander is a 52 y.o. male here for follow up for seropositive RA on Enbrel 50 mg subcu weekly and hydroxychloroquine 400 mg daily and prednisone 5 mg daily.  We discussed resuming his previous methotrexate treatment after last visit due to ongoing joint pain but he is not actually on the medication mostly due to financial and transportation issue.  Most of his joint pain and swelling have been  doing better and knee pain is improved after steroid injections at last visit.  For the past 1 to 2 weeks though left ankle pain increased has pain with pressure movement and with weightbearing.  He did not recall any preceding injury or change in activity and has not been sick.  Doing slightly better now compared to a week ago he is not sure whether there is any additional swelling.   Previous HPI 08/14/22 Jeremiah Alexander is a 52 y.o. male here for follow up for seropositive RA on hydroxychloroquine 400 mg daily prednisone 5 mg daily after restarting Enbrel 50 mg subcu weekly after our visit in January.  He has seen an improvement in joint pain and stiffness symptoms.  Describes this as more relief than he has had on any previous treatment but still does not have him near his normal baseline. Knees still remain quite active and limiting his mobility. Symptoms are most severe involving his knees and in the right foot and ankle with some persistent swelling.  He has not noticed any serious side effects with the Enbrel injections.  He has had some increased achiness at the back of his scalp had some small scab or spots with excoriations but no visible rash.  Not seeing any rashes elsewhere.  Previous HPI 05/31/22 Jeremiah Alexander is a 52 y.o. male here for seropositive rheumatoid arthritis.  He is currently taking hydroxychloroquine 400 mg daily and prednisone 5 mg daily.  He was initially diagnosed with rheumatoid arthritis last year at [facility] in [place].  Initial symptom onset was since at least about 2 years ago with gradually progressing worsening pain and swelling and weakness affecting his right hand and wrist.  Then involving both sides and subsequently proceeded to have worsening pain and swelling affecting both of his knees and feet as well.  Currently knee pain and swelling is his most problematic symptom and has severely reduced his mobility.  Laboratory findings showed highly positive  rheumatoid factor and a low positive anti-CCP antibodies.  He was also started on methotrexate that he took for only a short time seems to have been discontinued just due to lack of refill or follow-up rather than from any particular intolerance or contraindication.  However never had dramatic symptomatic improvement.  He was also recommended to start Enbrel but initially this was stopped after losing his medical insurance. He was hospitalized with acute pulmonary embolism last year thought most likely coming from his uncontrolled inflammatory arthritis and immobility with no previous history of blood clots or coagulation disorder.  He is now on indefinite Eliquis anticoagulation. He continues to smoke cigarettes on some days with >20 year total history.   Labs reviewed RF 187 CCP 39   DMARD Hx Prednisone - current Methotrexate - Short duration in 2023 unintentionally stopped Plaquenil - current Enbrel - planned   No Rheumatology ROS completed.   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   HLD (hyperlipidemia) 05/31/2022   Infection of the upper respiratory tract 05/31/2022   Seropositive rheumatoid arthritis (HCC) 03/09/2022   Preventative health care 03/09/2022   Pulmonary embolism without acute cor pulmonale (HCC) 02/22/2022   Pulmonary embolism (HCC) 02/22/2022   TOBACCO ABUSE 06/05/2010   Diabetes (HCC) 05/08/2010   OBESITY 05/08/2010   HYPERTENSION, BENIGN ESSENTIAL 05/08/2010    Past Medical History:  Diagnosis Date    Diabetes mellitus without complication (HCC)    Family history of breast cancer    Family history of colon cancer    Family history of prostate cancer    GERD (gastroesophageal reflux disease)    High cholesterol    Hypertension    Neuroendocrine tumor 05/01/2023   RA (rheumatoid arthritis) (HCC)     Family History  Problem Relation Age of Onset   Rheum arthritis Mother    Breast cancer Maternal Aunt        dx. > 50   Throat cancer Maternal Uncle        smoker   Prostate cancer Maternal Uncle    Prostate cancer Maternal Uncle    Breast cancer Maternal Grandmother    Diabetes Other    Breast cancer Other        MGF's sister dx over 8   Breast cancer Half-Sister 67   Colon  cancer Half-Sister 85   Breast cancer Maternal Great-grandmother        MGF's mother dx over 58   Esophageal cancer Neg Hx    Rectal cancer Neg Hx    Stomach cancer Neg Hx    Past Surgical History:  Procedure Laterality Date   TOE AMPUTATION     Right Pinky Toe   Social History   Social History Narrative   Not on file   Immunization History  Administered Date(s) Administered   Influenza Whole 04/12/2010   Tdap 03/09/2022     Objective: Vital Signs: There were no vitals taken for this visit.   Physical Exam   Musculoskeletal Exam: ***  CDAI Exam: CDAI Score: -- Patient Global: --; Provider Global: -- Swollen: --; Tender: -- Joint Exam 05/16/2023   No joint exam has been documented for this visit   There is currently no information documented on the homunculus. Go to the Rheumatology activity and complete the homunculus joint exam.  Investigation: No additional findings.  Imaging: No results found.  Recent Labs: Lab Results  Component Value Date   WBC 6.1 01/17/2023   HGB 13.8 01/17/2023   PLT 263 01/17/2023   NA 140 01/17/2023   K 4.1 01/17/2023   CL 106 01/17/2023   CO2 25 01/17/2023   GLUCOSE 104 (H) 01/17/2023   BUN 13 01/17/2023   CREATININE 1.03 01/17/2023    BILITOT 0.5 01/17/2023   ALKPHOS 89 07/21/2012   AST 12 01/17/2023   ALT 9 01/17/2023   PROT 7.0 01/17/2023   ALBUMIN 3.6 04/22/2014   CALCIUM 9.0 01/17/2023   GFRAA >60 10/12/2019   QFTBGOLDPLUS NEGATIVE 05/31/2022    Speciality Comments: Enbrel started 05/31/2022  PLQ Eye Exam: Patient states he recently had an eye exam done at [facility] and will have them fax [place] the results.   Procedures:  No procedures performed Allergies: Patient has no known allergies.   Assessment / Plan:     Visit Diagnoses: No diagnosis found.  ***  Orders: No orders of the defined types were placed in this encounter.  No orders of the defined types were placed in this encounter.    Follow-Up Instructions: No follow-ups on file.   Metta Clines, RT  Note - This record has been created using [employer].  Chart creation errors have been sought, but may not always  have been located. Such creation errors do not reflect on  the standard of medical care.

## 2023-05-07 ENCOUNTER — Other Ambulatory Visit: Payer: Self-pay

## 2023-05-07 ENCOUNTER — Ambulatory Visit: Payer: Medicaid Other | Admitting: Physical Therapy

## 2023-05-07 NOTE — Progress Notes (Signed)
Chauncey Mann, DO  Tracer Gutridge Yes, thank you.       Previous Messages    ----- Message ----- From: Claudean Kinds Sent: 05/07/2023   2:22 PM EST To: Claudean Kinds; Chauncey Mann, DO Subject: request to hold Eliquis                        Patient has procedure for biopsy on 05/17/23.  Do we have ok to hold Eliquis for 48 hrs prior ?  Thanks, [facility]  Centralized Scheduling for Imaging Services System-Wide Scheduler III Phone : [phone]  Fax: [phone]

## 2023-05-09 ENCOUNTER — Other Ambulatory Visit: Payer: Self-pay

## 2023-05-10 ENCOUNTER — Other Ambulatory Visit: Payer: Self-pay

## 2023-05-10 NOTE — Progress Notes (Signed)
Specialty Pharmacy Refill Coordination Note  Jeremiah Alexander is a 52 y.o. male contacted today regarding refills of specialty medication(s) Etanercept (Enbrel Mini)   Patient requested Delivery   Delivery date: 05/21/23   Verified address: [address] [place], [place] [address]   Medication will be filled on 05/20/23.

## 2023-05-14 ENCOUNTER — Ambulatory Visit: Payer: Medicaid Other | Admitting: Physical Therapy

## 2023-05-16 ENCOUNTER — Other Ambulatory Visit: Payer: Self-pay | Admitting: Student

## 2023-05-16 ENCOUNTER — Ambulatory Visit: Payer: Medicaid Other | Admitting: Internal Medicine

## 2023-05-16 ENCOUNTER — Other Ambulatory Visit: Payer: Self-pay | Admitting: Internal Medicine

## 2023-05-16 ENCOUNTER — Other Ambulatory Visit (HOSPITAL_COMMUNITY): Payer: Self-pay

## 2023-05-16 DIAGNOSIS — E1142 Type 2 diabetes mellitus with diabetic polyneuropathy: Secondary | ICD-10-CM

## 2023-05-16 DIAGNOSIS — M059 Rheumatoid arthritis with rheumatoid factor, unspecified: Secondary | ICD-10-CM

## 2023-05-16 DIAGNOSIS — Z7952 Long term (current) use of systemic steroids: Secondary | ICD-10-CM

## 2023-05-16 DIAGNOSIS — M25572 Pain in left ankle and joints of left foot: Secondary | ICD-10-CM

## 2023-05-16 DIAGNOSIS — I10 Essential (primary) hypertension: Secondary | ICD-10-CM

## 2023-05-16 DIAGNOSIS — Z79899 Other long term (current) drug therapy: Secondary | ICD-10-CM

## 2023-05-17 ENCOUNTER — Ambulatory Visit (HOSPITAL_COMMUNITY)
Admission: RE | Admit: 2023-05-17 | Discharge: 2023-05-17 | Disposition: A | Discharge status: Home / Self Care | Payer: Medicaid Other | Source: Ambulatory Visit | Attending: Internal Medicine | Admitting: Internal Medicine

## 2023-05-17 ENCOUNTER — Other Ambulatory Visit: Payer: Self-pay

## 2023-05-17 DIAGNOSIS — C7A8 Other malignant neuroendocrine tumors: Secondary | ICD-10-CM

## 2023-05-17 DIAGNOSIS — Z7984 Long term (current) use of oral hypoglycemic drugs: Secondary | ICD-10-CM | POA: Insufficient documentation

## 2023-05-17 DIAGNOSIS — R9389 Abnormal findings on diagnostic imaging of other specified body structures: Secondary | ICD-10-CM | POA: Diagnosis not present

## 2023-05-17 DIAGNOSIS — Z7901 Long term (current) use of anticoagulants: Secondary | ICD-10-CM | POA: Insufficient documentation

## 2023-05-17 DIAGNOSIS — I2510 Atherosclerotic heart disease of native coronary artery without angina pectoris: Secondary | ICD-10-CM | POA: Diagnosis not present

## 2023-05-17 DIAGNOSIS — R918 Other nonspecific abnormal finding of lung field: Secondary | ICD-10-CM | POA: Insufficient documentation

## 2023-05-17 DIAGNOSIS — I110 Hypertensive heart disease with heart failure: Secondary | ICD-10-CM | POA: Diagnosis not present

## 2023-05-17 DIAGNOSIS — R590 Localized enlarged lymph nodes: Secondary | ICD-10-CM | POA: Diagnosis not present

## 2023-05-17 DIAGNOSIS — D3A8 Other benign neuroendocrine tumors: Secondary | ICD-10-CM | POA: Insufficient documentation

## 2023-05-17 DIAGNOSIS — F17210 Nicotine dependence, cigarettes, uncomplicated: Secondary | ICD-10-CM | POA: Insufficient documentation

## 2023-05-17 DIAGNOSIS — R599 Enlarged lymph nodes, unspecified: Secondary | ICD-10-CM | POA: Diagnosis not present

## 2023-05-17 DIAGNOSIS — K219 Gastro-esophageal reflux disease without esophagitis: Secondary | ICD-10-CM | POA: Insufficient documentation

## 2023-05-17 DIAGNOSIS — Z5986 Financial insecurity: Secondary | ICD-10-CM | POA: Insufficient documentation

## 2023-05-17 DIAGNOSIS — Z794 Long term (current) use of insulin: Secondary | ICD-10-CM | POA: Insufficient documentation

## 2023-05-17 DIAGNOSIS — I700 Atherosclerosis of aorta: Secondary | ICD-10-CM | POA: Diagnosis not present

## 2023-05-17 DIAGNOSIS — E1142 Type 2 diabetes mellitus with diabetic polyneuropathy: Secondary | ICD-10-CM

## 2023-05-17 DIAGNOSIS — Z86711 Personal history of pulmonary embolism: Secondary | ICD-10-CM | POA: Insufficient documentation

## 2023-05-17 DIAGNOSIS — I5032 Chronic diastolic (congestive) heart failure: Secondary | ICD-10-CM | POA: Insufficient documentation

## 2023-05-17 DIAGNOSIS — Z7985 Long-term (current) use of injectable non-insulin antidiabetic drugs: Secondary | ICD-10-CM | POA: Diagnosis not present

## 2023-05-17 DIAGNOSIS — M069 Rheumatoid arthritis, unspecified: Secondary | ICD-10-CM | POA: Diagnosis not present

## 2023-05-17 LAB — GLUCOSE, CAPILLARY: Glucose-Capillary: 137 mg/dL — ABNORMAL HIGH (ref 70–99)

## 2023-05-17 MED ORDER — LIDOCAINE HCL (PF) 1 % IJ SOLN
5.0000 mL | Freq: Once | INTRAMUSCULAR | Status: AC
Start: 2023-05-17 — End: 2023-05-17
  Administered 2023-05-17: 5 mL via INTRADERMAL

## 2023-05-17 MED ORDER — LIDOCAINE HCL (PF) 1 % IJ SOLN
INTRAMUSCULAR | Status: AC
  Filled 2023-05-17: qty 30

## 2023-05-17 NOTE — Procedures (Signed)
Interventional Radiology Procedure Note  Procedure:   US guided bx of left inguinal lymph node.  Mx 16g core  Complications: None Recommendations:  - Ok to shower tomorrow - Do not submerge for 3 days - Routine care   Signed,  Yvone Neu. Loreta Ave, DO

## 2023-05-17 NOTE — H&P (Signed)
Chief Complaint: Neuroendocrine cancer  Referring Provider(s): Norwood Levo  Supervising Physician: Gilmer Mor  Patient Status: [facility] - Out-pt  History of Present Illness: Jeremiah Alexander is a 52 y.o. male with history of PE on Eliquis, HFpEF, GERD, DM, and RA.  HE was seen by Dr. Mauri Brooklyn to plan colon cancer screening.  He underwent colonoscopy on 03/11/23 = erosions in the terminal ileum were biopsied.  Pathology revealed well differentiated neuroendocrine tumor.  PET done  04/12/23 showed hypermetabolic left inguinal lymph nodes.  He is here today for biopsy.  He has difficulty with transportation and arrived via [place] today.  He is NPO. No nausea/vomiting. No Fever/chills. ROS negative.  Patient is Full Code  Past Medical History:  Diagnosis Date   Diabetes mellitus without complication (HCC)    Family history of breast cancer    Family history of colon cancer    Family history of prostate cancer    GERD (gastroesophageal reflux disease)    High cholesterol    Hypertension    Neuroendocrine tumor 05/01/2023   RA (rheumatoid arthritis) (HCC)     Past Surgical History:  Procedure Laterality Date   TOE AMPUTATION     Right Pinky Toe    Allergies: Patient has no known allergies.  Medications: Prior to Admission medications   Medication Sig Start Date End Date Taking? Authorizing Provider  folic acid (FOLVITE) 1 MG tablet Take 1 tablet (1 mg total) by mouth daily. 08/15/22  Yes Rice, Jamesetta Orleans, MD  hydroxychloroquine (PLAQUENIL) 200 MG tablet Take 2 tablets (400 mg total) by mouth daily. 03/25/23  Yes Rice, Jamesetta Orleans, MD  insulin aspart protamine- aspart (NOVOLOG MIX 70/30) (70-30) 100 UNIT/ML injection Inject into the skin. 07/21/12  Yes [provider]  Insulin Lispro Prot & Lispro (HUMALOG 75/25 MIX) (75-25) 100 UNIT/ML Kwikpen Inject 12 Units into the skin 2 (two) times daily with a meal. 08/09/22  Yes Atway, Rayann N, DO   lisinopril-hydrochlorothiazide (ZESTORETIC) 20-12.5 MG tablet Take 1 tablet by mouth daily. 03/20/23  Yes Kathleen Lime, MD  metFORMIN (GLUCOPHAGE) 500 MG tablet Take 1 tablet (500 mg total) by mouth 2 (two) times daily with a meal. 08/09/22  Yes Atway, Rayann N, DO  methotrexate (RHEUMATREX) 2.5 MG tablet Take 8 tablets (20 mg total) by mouth once a week. Caution:Chemotherapy. Protect from light. 01/18/23  Yes Rice, Jamesetta Orleans, MD  pantoprazole (PROTONIX) 40 MG tablet Take 1 tablet (40 mg total) by mouth daily. 10/26/22  Yes Mapp, Gaylyn Cheers, MD  predniSONE (DELTASONE) 5 MG tablet Take 1 tablet (5 mg total) by mouth daily with breakfast. 01/17/23  Yes Rice, Jamesetta Orleans, MD  apixaban (ELIQUIS) 5 MG TABS tablet Take 1 tablet (5 mg total) by mouth 2 (two) times daily. 08/09/22   Atway, Rayann N, DO  blood glucose meter kit and supplies KIT Use up to four times daily as directed. 02/23/22   Zigmund Daniel., MD  Blood Pressure Monitor DEVI Use to check blood pressure daily. Hypertension I10.0 08/13/22   Atway, Rayann N, DO  etanercept (ENBREL MINI) 50 MG/ML injection Inject 50 mg into the skin once a week. 01/17/23   Fuller Plan, MD  Fingerstix Lancets MISC use as directed to check blood sugars up to 4 times daily 02/23/22   Zigmund Daniel., MD  glucose blood test strip use as directed to check blood sugars up to 4 times daily 02/23/22   Zigmund Daniel., MD  Insulin Pen Needle 31G X 8 MM MISC Use as needed to inject insulin. 08/09/22   Atway, Rayann N, DO  Semaglutide,0.25 or 0.5MG /DOS, 2 MG/3ML SOPN Inject 0.25 mg into the skin once a week. 08/09/22   Atway, Rayann N, DO  sertraline (ZOLOFT) 25 MG tablet Take 1 tablet (25 mg total) by mouth daily. 08/09/22   Chauncey Mann, DO     Family History  Problem Relation Age of Onset   Rheum arthritis Mother    Breast cancer Maternal Aunt        dx. > 50   Throat cancer Maternal Uncle        smoker   Prostate cancer Maternal Uncle     Prostate cancer Maternal Uncle    Breast cancer Maternal Grandmother    Diabetes Other    Breast cancer Other        MGF's sister dx over 6   Breast cancer Half-Sister 73   Colon cancer Half-Sister 61   Breast cancer Maternal Great-grandmother        MGF's mother dx over 52   Esophageal cancer Neg Hx    Rectal cancer Neg Hx    Stomach cancer Neg Hx     Social History   Socioeconomic History   Marital status: Single    Spouse name: Not on file   Number of children: 1   Years of education: Not on file   Highest education level: Not on file  Occupational History   Occupation: disablity  Tobacco Use   Smoking status: Every Day    Current packs/day: 0.25    Average packs/day: 0.3 packs/day for 30.0 years (7.5 ttl pk-yrs)    Types: Cigarettes    Passive exposure: Current   Smokeless tobacco: Never  Vaping Use   Vaping status: Never Used  Substance and Sexual Activity   Alcohol use: No   Drug use: Not Currently   Sexual activity: Yes    Birth control/protection: Condom  Other Topics Concern   Not on file  Social History Narrative   Not on file   Social Drivers of Health   Financial Resource Strain: Medium Risk (08/13/2022)   Overall Financial Resource Strain (CARDIA)    Difficulty of Paying Living Expenses: Somewhat hard  Food Insecurity: No Food Insecurity (02/23/2022)   Hunger Vital Sign    Worried About Running Out of Food in the Last Year: Never true    Ran Out of Food in the Last Year: Never true  Transportation Needs: No Transportation Needs (02/23/2022)   PRAPARE - Administrator, Civil Service (Medical): No    Lack of Transportation (Non-Medical): No  Physical Activity: Not on file  Stress: Not on file  Social Connections: Not on file     Review of Systems: A 12 point ROS discussed and pertinent positives are indicated in the HPI above.  All other systems are negative.   Vital Signs: BP (!) 191/113   Pulse 69   Temp 98.4 F (36.9 C)  (Oral)   Resp 19   Ht 6' (1.829 m)   Wt 285 lb (129.3 kg)   SpO2 99%   BMI 38.65 kg/m   Advance Care Plan: The advanced care place/surrogate decision maker was discussed at the time of visit and the patient did not wish to discuss or was not able to name a surrogate decision maker or provide an advance care plan.  Physical Exam Vitals reviewed.  Constitutional:  Appearance: Normal appearance.  HENT:     Head: Normocephalic and atraumatic.  Eyes:     Extraocular Movements: Extraocular movements intact.  Cardiovascular:     Rate and Rhythm: Normal rate and regular rhythm.  Pulmonary:     Effort: Pulmonary effort is normal. No respiratory distress.     Breath sounds: Normal breath sounds.  Abdominal:     General: There is no distension.     Palpations: Abdomen is soft.     Tenderness: There is no abdominal tenderness.  Musculoskeletal:        General: Normal range of motion.     Cervical back: Normal range of motion.  Skin:    General: Skin is warm and dry.  Neurological:     General: No focal deficit present.     Mental Status: He is alert and oriented to person, place, and time.  Psychiatric:        Mood and Affect: Mood normal.        Behavior: Behavior normal.        Thought Content: Thought content normal.        Judgment: Judgment normal.     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   EXAM: NUCLEAR MEDICINE PET SKULL BASE TO THIGH   TECHNIQUE: 4.19 mCi Cu 64 DOTATATE was injected intravenously. Full-ring PET imaging was performed from the skull base to thigh after the radiotracer. CT data was obtained and used for attenuation correction and anatomic localization.   COMPARISON:  CT angio chest 02/21/2022   FINDINGS: NECK   No radiotracer activity in neck lymph nodes.  Dr. Stefanie Libel   Incidental CT findings: None   CHEST   Mild tracer uptake within right axillary lymph node is identified. The  node measures 1.1 cm in short axis and has an SUV max 4.22. No tracer avid mediastinal or hilar lymph nodes. No tracer avid pulmonary nodules.   Incidental CT finding:Aortic atherosclerosis and coronary artery calcifications. Within the medial right lower lobe there is a of 0.9 cm lung nodule without corresponding increased uptake. On the previous exam this measured the same. Several additional tiny lung nodules are identified which are too small to characterize by PET-CT measuring less than 5 mm. For example 4 mm nodule within the lingula, image 28/7.   ABDOMEN/PELVIS   Focal area of increased radiotracer uptake is identified at the ileocecal valve with SUV max 15.15, a corresponding small nodular density is identified in this area measuring 1.5 cm, image 146/4.   No signs of tracer avid liver metastasis.   -Left external iliac lymph node measures 1.4 cm and has an SUV max of 5.80, image 177/4.   -Within the left inguinal region there are several prominent lymph nodes which exhibit increased tracer uptake, including:   -Conglomeration of lymph nodes in the left inguinal region measures 4.1 by 1.4 cm and has an SUV max of 6.51.   Physiologic activity noted in the liver, spleen, adrenal glands and kidneys.   Incidental CT findings:   Aortic atherosclerosis. Colonic diverticulosis without signs of acute diverticulitis. The appendix is visualized and appears normal   SKELETON   No focal activity to suggest skeletal metastasis.   Incidental CT findings:None   IMPRESSION: 1. Focal area of increased radiotracer uptake is identified at the ileocecal valve with SUV max 15.15, a corresponding small nodular density is identified in this area measuring 1.5 cm. Findings may reflect the biopsy-proven primary  neuroendocrine tumor of the ileum. 2. No signs of liver metastasis or tracer avid nodal metastasis within the mesentery. 3. Increased tracer uptake is identified within left  external iliac and left inguinal lymph nodes. Cannot exclude nodal metastasis. 4. Mild tracer uptake within a right axillary lymph node is identified. This is a nonspecific finding. 5. Stable appearance of 0.9 cm lung nodule within the medial right lower lobe without corresponding increased uptake. This is favored to represent a benign finding. Several additional tiny lung nodules are identified which are too small to characterize by PET-CT measuring less than 5 mm. 6. Coronary artery calcifications. 7.  Aortic Atherosclerosis (ICD10-I70.0).     Electronically Signed   By: Signa Kell M.D.   On: 04/12/2023 12:17  Labs:  CBC: Recent Labs    05/31/22 0929 08/14/22 0839 10/16/22 0821 01/17/23 1006  WBC 7.2 7.5 9.2 6.1  HGB 13.6 14.1 12.6* 13.8  HCT 41.8 43.0 38.5 42.3  PLT 341 332 268 263    COAGS: No results for input(s): "INR", "APTT" in the last 8760 hours.  BMP: Recent Labs    08/14/22 0839 10/16/22 0821 10/26/22 0943 01/17/23 1006  NA 140 142 140 140  K 4.4 4.2 4.1 4.1  CL 104 106 103 106  CO2 31 29 23 25   GLUCOSE 104* 104* 108* 104*  BUN 13 23 10 13   CALCIUM 9.3 9.0 9.2 9.0  CREATININE 0.82 1.51* 0.87 1.03    LIVER FUNCTION TESTS: Recent Labs    05/31/22 0929 08/14/22 0839 10/16/22 0821 01/17/23 1006  BILITOT 0.3 0.5 0.3 0.5  AST 9* 9* 10 12  ALT 9 9 10 9   PROT 7.1 7.2 6.3 7.0    TUMOR MARKERS: No results for input(s): "AFPTM", "CEA", "CA199", "CHROMGRNA" in the last 8760 hours.  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  Will proceed with image guided biposy today by Dr. Loreta Ave using local anesthesia only given patient is traveling via [place].  Risks and benefits of lymph node biopsy was discussed with the patient and/or patient's family including, but not limited to bleeding, infection, damage to adjacent structures or low yield requiring additional  tests.  All of the questions were answered and there is agreement to proceed.  Consent signed and in chart.'  Thank you for allowing our service to participate in [facility] THORSTENSON 's care.  Electronically Signed: Gwynneth Macleod, PA-C   05/17/2023, 7:12 AM      I spent a total of  30 Minutes  in face to face in clinical consultation, greater than 50% of which was counseling/coordinating care for lymph node biopsy.

## 2023-05-17 NOTE — Telephone Encounter (Signed)
Called and lvm for patient to give [place] a call back to schedule a appointment in order for [place] to continue to refill his medications.

## 2023-05-18 MED ORDER — METFORMIN HCL 500 MG PO TABS
500.0000 mg | ORAL_TABLET | Freq: Two times a day (BID) | ORAL | 1 refills | Status: AC
  Filled 2023-05-18 – 2023-07-16 (×2): qty 180, 90d supply, fill #0
  Filled 2023-09-18 – 2023-10-29 (×2): qty 180, 90d supply, fill #1

## 2023-05-19 ENCOUNTER — Other Ambulatory Visit (HOSPITAL_COMMUNITY): Payer: Self-pay

## 2023-05-20 ENCOUNTER — Other Ambulatory Visit: Payer: Self-pay

## 2023-05-20 ENCOUNTER — Telehealth: Payer: Self-pay

## 2023-05-20 ENCOUNTER — Ambulatory Visit: Payer: Medicaid Other | Admitting: Physical Therapy

## 2023-05-20 LAB — SURGICAL PATHOLOGY

## 2023-05-20 NOTE — Telephone Encounter (Signed)
Received notification from [facility] regarding a prior authorization for ENBREL. Authorization has been APPROVED from 05/20/2023 to 05/19/2024. Approval letter sent to scan center.  Authorization # [id number]

## 2023-05-20 NOTE — Telephone Encounter (Signed)
Received notification from [facility] pharmacy that patient requires a new authorization for their medication.  Submitted an URGENT Prior Authorization request to [facility] for ENBREL via CoverMyMeds. Will update once we receive a response.  Key: [id number]

## 2023-05-20 NOTE — Pre-Procedure Instructions (Signed)
 Surgical Instructions   Your procedure is scheduled on Tuesday, January 7th . Report to [facility] Main Entrance A at 8:00 A.M., then check in with the Admitting office. Any questions or running late day of surgery: call [phone]  Questions prior to your surgery date: call [phone], Monday-Friday, 8am-4pm. If you experience any cold or flu symptoms such as cough, fever, chills, shortness of breath, etc. between now and your scheduled surgery, please notify us  at the above number.     Remember:  Do not eat after midnight the night before your surgery   You may drink clear liquids until 7:00 a.m. the morning of your surgery.   Clear liquids allowed are: Water, Non-Citrus Juices (without pulp), Carbonated Beverages, Clear Tea (no milk, honey, etc.), Black Coffee Only (NO MILK, CREAM OR POWDERED CREAMER of any kind), and Gatorade.  Patient Instructions  The night before surgery:  No food after midnight. ONLY clear liquids after midnight Drink (2) 12 oz G2 Gatorades before midnight. We suggest one at dinner time and one at bedtime.   The day of surgery (if you have diabetes): Drink ONE (1) 12 oz G2 given to you in your pre admission testing appointment by 7:00 a.m. the morning of surgery. Drink in one sitting. Do not sip.  This drink was given to you during your hospital  pre-op appointment visit.  Nothing else to drink after completing the  12 oz bottle of G2.         If you have questions, please contact your surgeon's office.    Take these medicines the morning of surgery with A SIP OF WATER   hydroxychloroquine  (PLAQUENIL )  predniSONE  (DELTASONE )   Follow your surgeon's instructions on when to stop apixaban  (ELIQUIS ). If no instructions were given to you, please contact your surgeon's office for further instructions.   One week prior to surgery, STOP taking any Aspirin (unless otherwise instructed by your surgeon) Aleve, Naproxen, Ibuprofen , Motrin , Advil , Goody's,  BC's, all herbal medications, fish oil, and non-prescription vitamins.           WHAT DO I DO ABOUT MY DIABETES MEDICATION?   THE NIGHT BEFORE SURGERY: TAKE YOUR NORMAL DINNER DOSE OF HUMALOG  INSULIN .      THE MORNING OF SURGERY, If your CBG is greater than 220 mg/dL, you may take  of your sliding scale (correction) dose of  insulin .  7 days prior to surgery, STOP taking Semaglutide  (OZEMPIC ). DO NOT TAKE THIS FRIDAY'S DOSE (05/24/23).     HOW TO MANAGE YOUR DIABETES BEFORE AND AFTER SURGERY  Why is it important to control my blood sugar before and after surgery? Improving blood sugar levels before and after surgery helps healing and can limit problems. A way of improving blood sugar control is eating a healthy diet by:  Eating less sugar and carbohydrates  Increasing activity/exercise  Talking with your doctor about reaching your blood sugar goals High blood sugars (greater than 180 mg/dL) can raise your risk of infections and slow your recovery, so you will need to focus on controlling your diabetes during the weeks before surgery. Make sure that the doctor who takes care of your diabetes knows about your planned surgery including the date and location.  How do I manage my blood sugar before surgery? Check your blood sugar at least 4 times a day, starting 2 days before surgery, to make sure that the level is not too high or low.  Check your blood sugar the morning of your surgery  when you wake up and every 2 hours until you get to the Short Stay unit.  If your blood sugar is less than 70 mg/dL, you will need to treat for low blood sugar: Do not take insulin . Treat a low blood sugar (less than 70 mg/dL) with  cup of clear juice (cranberry or apple), 4 glucose tablets, OR glucose gel. Recheck blood sugar in 15 minutes after treatment (to make sure it is greater than 70 mg/dL). If your blood sugar is not greater than 70 mg/dL on recheck, call [phone] for further  instructions. Report your blood sugar to the short stay nurse when you get to Short Stay.  If you are admitted to the hospital after surgery: Your blood sugar will be checked by the staff and you will probably be given insulin  after surgery (instead of oral diabetes medicines) to make sure you have good blood sugar levels. The goal for blood sugar control after surgery is 80-180 mg/dL.             Do NOT Smoke (Tobacco/Vaping) for 24 hours prior to your procedure.  If you use a CPAP at night, you may bring your mask/headgear for your overnight stay.   You will be asked to remove any contacts, glasses, piercing's, hearing aid's, dentures/partials prior to surgery. Please bring cases for these items if needed.    Patients discharged the day of surgery will not be allowed to drive home, and someone needs to stay with them for 24 hours.  SURGICAL WAITING ROOM VISITATION Patients may have no more than 2 support people in the waiting area - these visitors may rotate.   Pre-op nurse will coordinate an appropriate time for 1 ADULT support person, who may not rotate, to accompany patient in pre-op.  Children under the age of 4 must have an adult with them who is not the patient and must remain in the main waiting area with an adult.  If the patient needs to stay at the hospital during part of their recovery, the visitor guidelines for inpatient rooms apply.  Please refer to the [facility] website for the visitor guidelines for any additional information.   If you received a COVID test during your pre-op visit  it is requested that you wear a mask when out in public, stay away from anyone that may not be feeling well and notify your surgeon if you develop symptoms. If you have been in contact with anyone that has tested positive in the last 10 days please notify you surgeon.      Pre-operative CHG Bathing Instructions   You can play a key role in reducing the risk of infection after surgery.  Your skin needs to be as free of germs as possible. You can reduce the number of germs on your skin by washing with CHG (chlorhexidine  gluconate) soap before surgery. CHG is an antiseptic soap that kills germs and continues to kill germs even after washing.   DO NOT use if you have an allergy to chlorhexidine /CHG or antibacterial soaps. If your skin becomes reddened or irritated, stop using the CHG and notify one of our RNs at [phone].              TAKE A SHOWER THE NIGHT BEFORE SURGERY AND THE DAY OF SURGERY    Please keep in mind the following:  DO NOT shave, including legs and underarms, 48 hours prior to surgery.   You may shave your face before/day of surgery.  Place  clean sheets on your bed the night before surgery Use a clean washcloth (not used since being washed) for each shower. DO NOT sleep with pet's night before surgery.  CHG Shower Instructions:  Wash your face and private area with normal soap. If you choose to wash your hair, wash first with your normal shampoo.  After you use shampoo/soap, rinse your hair and body thoroughly to remove shampoo/soap residue.  Turn the water OFF and apply half the bottle of CHG soap to a CLEAN washcloth.  Apply CHG soap ONLY FROM YOUR NECK DOWN TO YOUR TOES (washing for 3-5 minutes)  DO NOT use CHG soap on face, private areas, open wounds, or sores.  Pay special attention to the area where your surgery is being performed.  If you are having back surgery, having someone wash your back for you may be helpful. Wait 2 minutes after CHG soap is applied, then you may rinse off the CHG soap.  Pat dry with a clean towel  Put on clean pajamas    Additional instructions for the day of surgery: DO NOT APPLY any lotions, deodorants, cologne, or perfumes.   Do not wear jewelry or makeup Do not wear nail polish, gel polish, artificial nails, or any other type of covering on natural nails (fingers and toes) Do not bring valuables to the hospital.  [facility] is not responsible for valuables/personal belongings. Put on clean/comfortable clothes.  Please brush your teeth.  Ask your nurse before applying any prescription medications to the skin.

## 2023-05-21 ENCOUNTER — Other Ambulatory Visit: Payer: Self-pay

## 2023-05-21 ENCOUNTER — Encounter (HOSPITAL_COMMUNITY)
Admission: RE | Admit: 2023-05-21 | Discharge: 2023-05-21 | Disposition: A | Discharge status: Home / Self Care | Payer: Medicaid Other | Source: Ambulatory Visit | Attending: General Surgery | Admitting: General Surgery

## 2023-05-21 ENCOUNTER — Encounter (HOSPITAL_COMMUNITY): Payer: Self-pay

## 2023-05-21 VITALS — BP 174/113 | HR 92 | Temp 98.7°F | Resp 19 | Ht 72.0 in | Wt 296.0 lb

## 2023-05-21 DIAGNOSIS — Z01818 Encounter for other preprocedural examination: Secondary | ICD-10-CM | POA: Diagnosis not present

## 2023-05-21 DIAGNOSIS — C7A8 Other malignant neuroendocrine tumors: Secondary | ICD-10-CM | POA: Insufficient documentation

## 2023-05-21 LAB — COMPREHENSIVE METABOLIC PANEL
ALT: 16 U/L (ref 0–44)
AST: 16 U/L (ref 15–41)
Albumin: 3.4 g/dL — ABNORMAL LOW (ref 3.5–5.0)
Alkaline Phosphatase: 106 U/L (ref 38–126)
Anion gap: 8 (ref 5–15)
BUN: 11 mg/dL (ref 6–20)
CO2: 27 mmol/L (ref 22–32)
Calcium: 9.2 mg/dL (ref 8.9–10.3)
Chloride: 103 mmol/L (ref 98–111)
Creatinine, Ser: 1.03 mg/dL (ref 0.61–1.24)
GFR, Estimated: >60 mL/min (ref >60)
Glucose, Bld: 108 mg/dL — ABNORMAL HIGH (ref 70–99)
Potassium: 4 mmol/L (ref 3.5–5.1)
Sodium: 138 mmol/L (ref 135–145)
Total Bilirubin: 0.7 mg/dL (ref 0.0–1.2)
Total Protein: 7.7 g/dL (ref 6.5–8.1)

## 2023-05-21 LAB — CBC WITH DIFFERENTIAL/PLATELET
Abs Immature Granulocytes: 0.03 10*3/uL (ref 0.00–0.07)
Basophils Absolute: 0 10*3/uL (ref 0.0–0.1)
Basophils Relative: 1 %
Eosinophils Absolute: 0.2 10*3/uL (ref 0.0–0.5)
Eosinophils Relative: 3 %
HCT: 44.2 % (ref 39.0–52.0)
Hemoglobin: 14.8 g/dL (ref 13.0–17.0)
Immature Granulocytes: 0 %
Lymphocytes Relative: 26 %
Lymphs Abs: 1.8 10*3/uL (ref 0.7–4.0)
MCH: 29.2 pg (ref 26.0–34.0)
MCHC: 33.5 g/dL (ref 30.0–36.0)
MCV: 87.2 fL (ref 80.0–100.0)
Monocytes Absolute: 0.5 10*3/uL (ref 0.1–1.0)
Monocytes Relative: 7 %
Neutro Abs: 4.4 10*3/uL (ref 1.7–7.7)
Neutrophils Relative %: 63 %
Platelets: 287 10*3/uL (ref 150–400)
RBC: 5.07 MIL/uL (ref 4.22–5.81)
RDW: 12.7 % (ref 11.5–15.5)
WBC: 7.1 10*3/uL (ref 4.0–10.5)
nRBC: 0 % (ref 0.0–0.2)

## 2023-05-21 LAB — PROTIME-INR
INR: 0.9 (ref 0.8–1.2)
Prothrombin Time: 12.5 s (ref 11.4–15.2)

## 2023-05-21 LAB — GLUCOSE, CAPILLARY: Glucose-Capillary: 128 mg/dL — ABNORMAL HIGH (ref 70–99)

## 2023-05-21 LAB — TYPE AND SCREEN
ABO/RH(D): A POS
Antibody Screen: NEGATIVE

## 2023-05-21 NOTE — Progress Notes (Signed)
 PCP - Drue Grow, MD-[facility] Cardiologist - denies  PPM/ICD - n/a  Chest x-ray - n/a EKG - 05/21/23 Stress Test - denies ECHO - denies Cardiac Cath - denies  Sleep Study - denies, Stop Bang +, routed to PCP CPAP - denies  Fasting Blood Sugar - 100-150 Checks Blood Sugar 2 times a day. CBG at PAT 120. Will obtain A1C today.  Last dose of GLP1 agonist-  LD of Ozempic -05/17/23 GLP1 instructions: Hold 1 week prior to surgery. Pt will not take 05/23/22 dose.  Blood Thinner Instructions: Eliquis : Hold 2 days prior to surgery. LD 05/25/23 Aspirin Instructions: n/a  ERAS Protcol -(3) G2 given, 2 the night before and one morning of. PRE-SURGERY Ensure or G2- G2  Instructed the patient to call surgeon's office regarding bowel prep for surgery.  COVID TEST- n/a  Anesthesia review: BP high at PAT. Diastolic 113, pt takes HTN meds inconsistently for the time of administration but does take the medication daily. Advised pt, per Lynwood Hope, PA-C to take medication in the evening, monitor BP for the next few days and contact PCP for a therapy change if numbers are consistently high over the next few days. Pt verbalized understanding.   Patient denies shortness of breath, fever, cough and chest pain at PAT appointment   All instructions explained to the patient, with a verbal understanding of the material. Patient agrees to go over the instructions while at home for a better understanding. Patient also instructed to self quarantine after being tested for COVID-19. The opportunity to ask questions was provided.

## 2023-05-21 NOTE — Progress Notes (Signed)
   05/21/23 1030  OBSTRUCTIVE SLEEP APNEA  Have you ever been diagnosed with sleep apnea through a sleep study? No  Do you snore loudly (loud enough to be heard through closed doors)?  1  Do you often feel tired, fatigued, or sleepy during the daytime (such as falling asleep during driving or talking to someone)? 0  Has anyone observed you stop breathing during your sleep? 0  Do you have, or are you being treated for high blood pressure? 1  BMI more than 35 kg/m2? 1  Age > 50 (1-yes) 1  Neck circumference greater than:Male 16 inches or larger, Male 17inches or larger? 1  Male Gender (Yes=1) 1  Obstructive Sleep Apnea Score 6  Score 5 or greater  Results sent to PCP

## 2023-05-22 LAB — HEMOGLOBIN A1C
Hgb A1c MFr Bld: 6.5 % — ABNORMAL HIGH (ref 4.8–5.6)
Mean Plasma Glucose: 140 mg/dL

## 2023-05-23 ENCOUNTER — Encounter: Payer: Self-pay | Admitting: Genetic Counselor

## 2023-05-23 ENCOUNTER — Telehealth: Payer: Self-pay | Admitting: Genetic Counselor

## 2023-05-23 ENCOUNTER — Other Ambulatory Visit: Payer: Self-pay

## 2023-05-23 ENCOUNTER — Other Ambulatory Visit (HOSPITAL_COMMUNITY): Payer: Self-pay

## 2023-05-23 DIAGNOSIS — Z1379 Encounter for other screening for genetic and chromosomal anomalies: Secondary | ICD-10-CM | POA: Insufficient documentation

## 2023-05-23 LAB — CHROMOGRANIN A: Chromogranin A (ng/mL): 66.9 ng/mL (ref 0.0–101.8)

## 2023-05-23 MED ORDER — BISACODYL 5 MG PO TBEC
20.0000 mg | DELAYED_RELEASE_TABLET | Freq: Every day | ORAL | 0 refills | Status: AC
  Filled 2023-05-23: qty 4, 1d supply, fill #0

## 2023-05-23 MED ORDER — POLYETHYLENE GLYCOL 3350 17 GM/SCOOP PO POWD
238.0000 g | Freq: Every day | ORAL | 0 refills | Status: AC
  Filled 2023-05-23: qty 238, 1d supply, fill #0

## 2023-05-23 MED ORDER — NEOMYCIN SULFATE 500 MG PO TABS
1000.0000 mg | ORAL_TABLET | Freq: Three times a day (TID) | ORAL | 0 refills | Status: AC
  Filled 2023-05-23: qty 6, 1d supply, fill #0

## 2023-05-23 MED ORDER — METRONIDAZOLE 500 MG PO TABS
1000.0000 mg | ORAL_TABLET | Freq: Three times a day (TID) | ORAL | 0 refills | Status: AC
  Filled 2023-05-23: qty 6, 1d supply, fill #0

## 2023-05-23 NOTE — Telephone Encounter (Signed)
 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  Discussed that this does not explain his NET, but can increase the risk for ovarian cancer and sometimes breast cancer in women.  We can offer genetic testing to his sisters and his mother, his mother to determine the side of the family this is coming from.  His daughter is 62 so we would not offer genetic testing to her at this time.  The patient voiced understanding.  WE have scheduled him for 06/12/2023 to be seen to discuss this finding.

## 2023-05-24 ENCOUNTER — Other Ambulatory Visit (HOSPITAL_COMMUNITY): Payer: Self-pay

## 2023-05-27 ENCOUNTER — Ambulatory Visit: Payer: Medicaid Other | Admitting: Physical Therapy

## 2023-05-27 NOTE — H&P (Signed)
 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  The patient was undergoing a screening colonoscopy.  The colon had a small polyp in the ascending colon which was hyperplastic.  There was some nodular type mucosa at the ileocecal valve as well as some nonbleeding erosions in the terminal ileum.  This was biopsied.  Biopsy was positive for well-differentiated grade 1 neuroendocrine tumor.  Patient denies abdominal symptoms.  He has not had any diarrhea, nausea, vomiting.  He has not had flushing.   The patient does have significant family cancer history.  He had 2 maternal aunts with breast cancer, maternal uncle with prostate cancer, maternal uncle with throat cancer, and a maternal grandmother with breast cancer.   His main complaints are related to his rheumatoid arthritis.  He has significant lower extremity pain and does not get around as much anymore.  He is on Enbrel , methotrexate , Plaquenil , and 5 mg of prednisone .   He also is on Eliquis  for PE 1 year ago.     Colonoscopy:  03/11/23 Kidney The terminal ileum contained a few localized non- bleeding erosions. No stigmata of recent bleeding were seen. Biopsies were taken with a cold forceps for histology. - A localized area of mucosa in the terminal ileum was nodular ( perhaps prolapsed ileal mucosa that projected out of the ileocecal valve) . Biopsies were taken with a cold forceps for histology. - A 3 to 4 mm polyp was found in the rectum ascending colon. The polyp was sessile. These polyps were removed with a cold snare. Resection and retrieval were complete. - Multiple diverticula were found in the sigmoid colon. - Non- bleeding internal hemorrhoids were found during retroflexion.   Pathology:  03/11/23 1. Surgical [P], small bowel, ileal nodule bx's WELL-DIFFERENTIATED  NEUROENDOCRINE TUMOR, WHO GRADE 1. TUMOR PRESENTS ON MULTIPLE TISSUE PIECES WITH 2 MM IN MAXIMAL LINEAR DIMENSION. TUMOR EXTENDS TO THE TISSUE EDGE. 2. Surgical [P], small bowel, ileal bx's ILEAL MUCOSA WITH PRESERVED VILLOGLANDULAR ARCHITECTURE WITHOUT INCREASED INTRAEPITHELIAL LYMPHOCYTES OR EVIDENCE OF ACTIVE INFLAMMATION. NO EVIDENCE OF ACTIVITY, CHRONICITY, GRANULOMA, DYSPLASIA OR MALIGNANCY. 3. Surgical [P], colon, ascending polyp x1, rectal polyp x1, polyp (2) ONE FRAGMENT OF HYPERPLASTIC POLYP. NEGATIVE FOR DYSPLASIA.   Imaging: none available     Past Medical History      Past Medical History:  Diagnosis Date   Diabetes mellitus without complication (CMS/HHS-HCC)     Hypertension          Past Surgical History       Past Surgical History:  Procedure Laterality Date   AMPUTATION TOE   2013   PRIORITY DEBRIDEMENT; SKIN, SUBCUTANEOUS TISSUE, MUSCLE, & BONE   10/16/2018    Surgeon: Pierce Maryelizabeth Finn, MD; Location: MAIN OR [facility]; Service: Vascular        Current Medications        Current Outpatient Medications  Medication Sig Dispense Refill   acetaminophen  (TYLENOL ) 500 MG tablet Take 1,000 mg by mouth as needed for Pain       apixaban  (ELIQUIS ) 5 mg tablet Take 5 mg by mouth 2 (two) times daily       etanercept  (ENBREL ) 50 mg/mL (1 mL) pen injector Inject 1 mL (50 mg total) subcutaneously once a week for 360 days 4 mL 11  insulin  LISPRO PROTAMINE-LISPRO (HUMALOG  KWIKPEN MIX 75/25) 100 unit/mL (75-25) pen injector Inject subcutaneously       lisinopriL  (ZESTRIL ) 10 MG tablet Take 10 mg by mouth 2 (two) times daily       predniSONE  (DELTASONE ) 5 MG tablet Take 5 mg by mouth daily with breakfast       semaglutide  (OZEMPIC ) 0.25 mg or 0.5 mg (2 mg/3 mL) pen injector Inject subcutaneously       celecoxib  (CELEBREX ) 200 MG capsule Take 1 capsule (200 mg total) by mouth 2 (two) times daily (Patient not taking: Reported on 03/25/2023) 60 capsule 0   folic acid   (FOLVITE ) 1 MG tablet Take 1 tablet (1 mg total) by mouth once daily for 360 days 90 tablet 3   hydroxychloroquine  (PLAQUENIL ) 200 mg tablet Take 1 tablet (200 mg total) by mouth 2 (two) times daily for 90 days Take with food. 60 tablet 2   insulin  NPH hum/reg insulin  hm (NOVOLIN 70-30 FLEXPEN U-100 SUBQ) Inject 25 Units subcutaneously 2 (two) times daily (Patient not taking: Reported on 03/25/2023)       lisinopriL -hydroCHLOROthiazide  (ZESTORETIC ) 20-12.5 mg tablet Take 1 tablet by mouth once daily       metFORMIN  (GLUCOPHAGE ) 500 MG tablet Take 500 mg by mouth 2 (two) times daily with meals (Patient not taking: Reported on 03/25/2023)       methotrexate  (RHEUMATREX) 2.5 MG tablet Take 8 tablets (20 mg total) by mouth every 7 (seven) days for 90 days 32 tablet 2    No current facility-administered medications for this visit.          Allergies  No Known Allergies       Family History        Family History  Problem Relation Name Age of Onset   High blood pressure (Hypertension) Mother       No Known Problems Father              Social History  Social History         Socioeconomic History   Marital status: Single  Tobacco Use   Smoking status: Some Days      Types: Cigarettes   Smokeless tobacco: Never  Vaping Use   Vaping status: Never Used  Substance and Sexual Activity   Alcohol use: Not Currently   Drug use: Never   Sexual activity: Yes    Social Drivers of Health        Financial Resource Strain: Medium Risk (08/13/2022)    Received from [facility]    Overall Financial Resource Strain (CARDIA)     Difficulty of Paying Living Expenses: Somewhat hard  Food Insecurity: No Food Insecurity (02/23/2022)    Received from [facility], [place]    Hunger Vital Sign     Worried About Running Out of Food in the Last Year: Never true     Ran Out of Food in the Last Year: Never true  Transportation Needs: No Transportation Needs (02/23/2022)    Received from [facility], [place]    PRAPARE - Transportation     Lack of Transportation (Medical): No     Lack of Transportation (Non-Medical): No          REVIEW OF SYSTEMS - PERTINENT POSITIVES ONLY: A complete review of systems negative other than HPI and PMH except for itching, shortness of breath, heartburn, neck pain, depression, nervous/anxious, memory loss   EXAM:    Vitals:      BP: ROLLEN)  177/122  Pulse: 81  Temp: 37.7 C (99.9 F)        Gen:  No acute distress.  Well nourished and well groomed.   Neurological: Alert and oriented to person, place, and time. Coordination normal.  Head: Normocephalic and atraumatic.  Eyes: Conjunctivae are normal. Pupils are equal, round, and reactive to light. No scleral icterus.  Neck: Normal range of motion. Neck supple. No tracheal deviation or thyromegaly present.  Cardiovascular: Normal rate, regular rhythm, normal heart sounds and intact distal pulses.  Exam reveals no gallop and no friction rub.  No murmur heard. Respiratory: Effort normal.  No respiratory distress. No chest wall tenderness. Breath sounds normal.  No wheezes, rales or rhonchi.  GI: Soft. Bowel sounds are normal. The abdomen is soft and nontender.  There is no rebound and no guarding.  Musculoskeletal: Normal range of motion. Extremities are nontender.  Lymphadenopathy: No cervical, preauricular, postauricular or axillary adenopathy is present Skin: Skin is warm and dry. No rash noted. No diaphoresis. No erythema. No pallor. No clubbing, cyanosis, or edema.   Psychiatric: Normal mood and affect. Behavior is normal. Judgment and thought content normal.      LABORATORY RESULTS: Available labs are reviewed       ASSESSMENT AND PLAN: Neuroendocrine neoplasm of small intestine (CMS-HCC)  (primary encounter diagnosis) Plan: Ambulatory Referral to Cancer Genetics   Physical deconditioning Plan: Ambulatory Referral to Physical Therapy   Rheumatoid arthritis involving multiple  sites with positive rheumatoid factor (CMS/HHS-HCC) Plan: Ambulatory Referral to Physical Therapy   Smoker   Personal history of PE (pulmonary embolism)   Immunosuppressed status (CMS/HHS-HCC)   Family history of cancer     Patient has a new diagnosis of neuroendocrine tumor.  If his dotatate scan is negative for metastatic disease, I would plan a laparoscopic ileocolectomy.  I discussed the surgery with the patient and his family.  I reviewed where the incisions would be, I reviewed general anesthesia, I discussed the anatomy of the surgical site with diagrams.  I reviewed the change in the colon anatomy with surgery.  I discussed risks including bleeding, infection, wound breakdown, possible need for additional procedures or surgeries, possible ostomy, possible heart or lung complications, possible blood clot.  I will get a chromogranin level preoperatively with his other blood work.  He may need referral to medical oncology depending on his PET and surgical pathology.   His risks are higher for these considering that he will need to restart blood thinners relatively quickly.  He also is a smoker which increases his risk of anastomotic leak as well as hernia and wound infection.  He also is at risk for healing difficulties given his immunosuppression.   He does have several challenging medical issues.  We are messaging his rheumatologist regarding instructions with his immunosuppressants.  I am not sure if we need to hold for 2 weeks or longer with the Enbrel  and the methotrexate .  I think we will be able to continue the hydroxychloroquine  and the prednisone .   The patient reports that his rheumatologist has been prescribing the Eliquis .  I suspect that this might not be the case.  If not, we will contact the appropriate physician for that.   I am referring him preoperatively for physical therapy for prehabilitation.  I discussed that any activity that he could do preoperatively would be  helpful for his recovery postop.     I have communicated with Dr. Federico regarding the PET scan.   I am  referring him for genetic testing given the family history of cancer.

## 2023-05-27 NOTE — Anesthesia Preprocedure Evaluation (Addendum)
 Anesthesia Evaluation  Patient identified by MRN, date of birth, ID band Patient awake    Reviewed: Allergy & Precautions, NPO status , Patient's Chart, lab work & pertinent test results  Airway Mallampati: II  TM Distance: >3 FB Neck ROM: Full    Dental no notable dental hx. (+) Teeth Intact, Dental Advisory Given   Pulmonary Current Smoker and Patient abstained from smoking., PE (02/2022 on Eliquis )   Pulmonary exam normal breath sounds clear to auscultation       Cardiovascular hypertension, Pt. on medications Normal cardiovascular exam Rhythm:Regular Rate:Normal  02/2022 TTE 1. Left ventricular ejection fraction, by estimation, is 50 to 55%. The  left ventricle has low normal function. The left ventricle has no regional  wall motion abnormalities. Left ventricular diastolic parameters are  consistent with Grade I diastolic  dysfunction (impaired relaxation).   2. Right ventricular systolic function is normal. The right ventricular  size is mildly enlarged. Tricuspid regurgitation signal is inadequate for  assessing PA pressure.   3. The mitral valve is normal in structure. Trivial mitral valve  regurgitation. No evidence of mitral stenosis.   4. The aortic valve is tricuspid. Aortic valve regurgitation is not  visualized. No aortic stenosis is present.   5. There is borderline dilatation of the ascending aorta, measuring 38  mm.   6. The inferior vena cava is dilated in size with >50% respiratory  variability, suggesting right atrial pressure of 8 mmHg.      Neuro/Psych    GI/Hepatic ,GERD  Medicated and Controlled,,  Endo/Other  diabetes, Type 2  Class 3 obesity (BMI 40.1)On Semaglutide   Renal/GU Lab Results      Component                Value               Date                            K                        4.0                 05/21/2023                CREATININE               1.03                05/21/2023                 GFRNONAA                 >60                 05/21/2023                                 ALBUMIN                   3.4 (L)             05/21/2023                         Musculoskeletal  (+) Arthritis  (on Methotrexate , plaquenil , prednisone ), Rheumatoid disorders,    Abdominal   Peds  Hematology Lab Results  Component                Value               Date                          HGB                      14.8                05/21/2023                HCT                      44.2                05/21/2023              PLT                      287                 05/21/2023              Anesthesia Other Findings   Reproductive/Obstetrics                             Anesthesia Physical Anesthesia Plan  ASA: 3  Anesthesia Plan: General   Post-op Pain Management: Lidocaine  infusion* and Tylenol  PO (pre-op)*   Induction: Intravenous  PONV Risk Score and Plan: 2 and Treatment may vary due to age or medical condition, Midazolam  and Dexamethasone   Airway Management Planned: Oral ETT and Video Laryngoscope Planned  Additional Equipment: Arterial line  Intra-op Plan:   Post-operative Plan: Extubation in OR  Informed Consent: I have reviewed the patients History and Physical, chart, labs and discussed the procedure including the risks, benefits and alternatives for the proposed anesthesia with the patient or authorized representative who has indicated his/her understanding and acceptance.     Dental advisory given  Plan Discussed with: CRNA and Anesthesiologist  Anesthesia Plan Comments:         Anesthesia Quick Evaluation

## 2023-05-28 ENCOUNTER — Inpatient Hospital Stay (HOSPITAL_COMMUNITY)
Admission: RE | Admit: 2023-05-28 | Discharge: 2023-06-01 | DRG: 827 | Disposition: A | Discharge status: Home / Self Care | Payer: Medicaid Other | Attending: General Surgery | Admitting: General Surgery

## 2023-05-28 ENCOUNTER — Inpatient Hospital Stay (HOSPITAL_COMMUNITY): Payer: Self-pay | Admitting: Certified Registered Nurse Anesthetist

## 2023-05-28 ENCOUNTER — Other Ambulatory Visit: Payer: Self-pay

## 2023-05-28 ENCOUNTER — Inpatient Hospital Stay (HOSPITAL_COMMUNITY): Payer: Medicaid Other | Admitting: Physician Assistant

## 2023-05-28 ENCOUNTER — Encounter (HOSPITAL_COMMUNITY)
Admission: RE | Disposition: A | Discharge status: Home / Self Care | Payer: Self-pay | Source: Home / Self Care | Attending: General Surgery

## 2023-05-28 ENCOUNTER — Encounter (HOSPITAL_COMMUNITY): Payer: Self-pay | Admitting: General Surgery

## 2023-05-28 DIAGNOSIS — Z5986 Financial insecurity: Secondary | ICD-10-CM

## 2023-05-28 DIAGNOSIS — Z8042 Family history of malignant neoplasm of prostate: Secondary | ICD-10-CM | POA: Diagnosis not present

## 2023-05-28 DIAGNOSIS — C7A012 Malignant carcinoid tumor of the ileum: Secondary | ICD-10-CM | POA: Diagnosis not present

## 2023-05-28 DIAGNOSIS — Z86711 Personal history of pulmonary embolism: Secondary | ICD-10-CM

## 2023-05-28 DIAGNOSIS — Z79631 Long term (current) use of antimetabolite agent: Secondary | ICD-10-CM

## 2023-05-28 DIAGNOSIS — Z794 Long term (current) use of insulin: Secondary | ICD-10-CM

## 2023-05-28 DIAGNOSIS — M0579 Rheumatoid arthritis with rheumatoid factor of multiple sites without organ or systems involvement: Secondary | ICD-10-CM | POA: Diagnosis present

## 2023-05-28 DIAGNOSIS — Z7984 Long term (current) use of oral hypoglycemic drugs: Secondary | ICD-10-CM

## 2023-05-28 DIAGNOSIS — Z79899 Other long term (current) drug therapy: Secondary | ICD-10-CM

## 2023-05-28 DIAGNOSIS — K5730 Diverticulosis of large intestine without perforation or abscess without bleeding: Secondary | ICD-10-CM | POA: Diagnosis not present

## 2023-05-28 DIAGNOSIS — C784 Secondary malignant neoplasm of small intestine: Secondary | ICD-10-CM | POA: Diagnosis not present

## 2023-05-28 DIAGNOSIS — Z7901 Long term (current) use of anticoagulants: Secondary | ICD-10-CM

## 2023-05-28 DIAGNOSIS — Z7985 Long-term (current) use of injectable non-insulin antidiabetic drugs: Secondary | ICD-10-CM | POA: Diagnosis not present

## 2023-05-28 DIAGNOSIS — I10 Essential (primary) hypertension: Secondary | ICD-10-CM | POA: Diagnosis not present

## 2023-05-28 DIAGNOSIS — Z803 Family history of malignant neoplasm of breast: Secondary | ICD-10-CM | POA: Diagnosis not present

## 2023-05-28 DIAGNOSIS — C7B8 Other secondary neuroendocrine tumors: Secondary | ICD-10-CM | POA: Diagnosis present

## 2023-05-28 DIAGNOSIS — C7A8 Other malignant neuroendocrine tumors: Secondary | ICD-10-CM | POA: Diagnosis not present

## 2023-05-28 DIAGNOSIS — Z808 Family history of malignant neoplasm of other organs or systems: Secondary | ICD-10-CM | POA: Diagnosis not present

## 2023-05-28 DIAGNOSIS — Z7952 Long term (current) use of systemic steroids: Secondary | ICD-10-CM | POA: Diagnosis not present

## 2023-05-28 DIAGNOSIS — C7A1 Malignant poorly differentiated neuroendocrine tumors: Secondary | ICD-10-CM | POA: Diagnosis not present

## 2023-05-28 DIAGNOSIS — D3A8 Other benign neuroendocrine tumors: Principal | ICD-10-CM | POA: Diagnosis present

## 2023-05-28 DIAGNOSIS — D84821 Immunodeficiency due to drugs: Secondary | ICD-10-CM | POA: Diagnosis not present

## 2023-05-28 DIAGNOSIS — Z8249 Family history of ischemic heart disease and other diseases of the circulatory system: Secondary | ICD-10-CM | POA: Diagnosis not present

## 2023-05-28 DIAGNOSIS — N179 Acute kidney failure, unspecified: Secondary | ICD-10-CM | POA: Diagnosis not present

## 2023-05-28 DIAGNOSIS — F17210 Nicotine dependence, cigarettes, uncomplicated: Secondary | ICD-10-CM

## 2023-05-28 DIAGNOSIS — F17200 Nicotine dependence, unspecified, uncomplicated: Secondary | ICD-10-CM | POA: Diagnosis not present

## 2023-05-28 DIAGNOSIS — Z860102 Personal history of hyperplastic colon polyps: Secondary | ICD-10-CM | POA: Diagnosis not present

## 2023-05-28 DIAGNOSIS — E785 Hyperlipidemia, unspecified: Secondary | ICD-10-CM | POA: Diagnosis not present

## 2023-05-28 DIAGNOSIS — C7A019 Malignant carcinoid tumor of the small intestine, unspecified portion: Secondary | ICD-10-CM

## 2023-05-28 DIAGNOSIS — E119 Type 2 diabetes mellitus without complications: Secondary | ICD-10-CM | POA: Diagnosis present

## 2023-05-28 DIAGNOSIS — Z01818 Encounter for other preprocedural examination: Secondary | ICD-10-CM

## 2023-05-28 HISTORY — PX: PARTIAL COLECTOMY: SHX5273

## 2023-05-28 LAB — CBC
HCT: 43 % (ref 39.0–52.0)
Hemoglobin: 14.4 g/dL (ref 13.0–17.0)
MCH: 28.9 pg (ref 26.0–34.0)
MCHC: 33.5 g/dL (ref 30.0–36.0)
MCV: 86.3 fL (ref 80.0–100.0)
Platelets: 290 10*3/uL (ref 150–400)
RBC: 4.98 MIL/uL (ref 4.22–5.81)
RDW: 12.4 % (ref 11.5–15.5)
WBC: 11.5 10*3/uL — ABNORMAL HIGH (ref 4.0–10.5)
nRBC: 0 % (ref 0.0–0.2)

## 2023-05-28 LAB — GLUCOSE, CAPILLARY
Glucose-Capillary: 144 mg/dL — ABNORMAL HIGH (ref 70–99)
Glucose-Capillary: 95 mg/dL (ref 70–99)
Glucose-Capillary: 136 mg/dL — ABNORMAL HIGH (ref 70–99)
Glucose-Capillary: 154 mg/dL — ABNORMAL HIGH (ref 70–99)
Glucose-Capillary: 134 mg/dL — ABNORMAL HIGH (ref 70–99)

## 2023-05-28 LAB — CREATININE, SERUM
Creatinine, Ser: 1.13 mg/dL (ref 0.61–1.24)
GFR, Estimated: >60 mL/min (ref >60)

## 2023-05-28 LAB — ABO/RH: ABO/RH(D): A POS

## 2023-05-28 SURGERY — COLECTOMY, PARTIAL
Anesthesia: General | Site: Abdomen

## 2023-05-28 MED ORDER — DEXMEDETOMIDINE HCL IN NACL 80 MCG/20ML IV SOLN
INTRAVENOUS | Status: DC | PRN
  Administered 2023-05-28 (×2): 8 ug via INTRAVENOUS

## 2023-05-28 MED ORDER — LIDOCAINE HCL (PF) 1 % IJ SOLN
INTRAMUSCULAR | Status: AC
  Filled 2023-05-28: qty 30

## 2023-05-28 MED ORDER — LACTATED RINGERS IV SOLN: INTRAVENOUS | Status: DC

## 2023-05-28 MED ORDER — INSULIN ASPART 100 UNIT/ML IJ SOLN
0.0000 [IU] | Freq: Three times a day (TID) | INTRAMUSCULAR | Status: DC
  Administered 2023-05-28: 4 [IU] via SUBCUTANEOUS
  Administered 2023-05-29: 3 [IU] via SUBCUTANEOUS

## 2023-05-28 MED ORDER — PHENYLEPHRINE 80 MCG/ML (10ML) SYRINGE FOR IV PUSH (FOR BLOOD PRESSURE SUPPORT)
PREFILLED_SYRINGE | INTRAVENOUS | Status: DC | PRN
  Administered 2023-05-28: 80 ug via INTRAVENOUS
  Administered 2023-05-28 (×3): 160 ug via INTRAVENOUS
  Administered 2023-05-28: 260 ug via INTRAVENOUS

## 2023-05-28 MED ORDER — GABAPENTIN 100 MG PO CAPS
200.0000 mg | ORAL_CAPSULE | Freq: Two times a day (BID) | ORAL | Status: DC
  Administered 2023-05-28 – 2023-06-01 (×9): 200 mg via ORAL
  Filled 2023-05-28 (×9): qty 2

## 2023-05-28 MED ORDER — ENSURE PRE-SURGERY PO LIQD: 592.0000 mL | Freq: Once | ORAL | Status: DC

## 2023-05-28 MED ORDER — ALVIMOPAN 12 MG PO CAPS
12.0000 mg | ORAL_CAPSULE | ORAL | Status: AC
  Administered 2023-05-28: 12 mg via ORAL
  Filled 2023-05-28: qty 1

## 2023-05-28 MED ORDER — PROCHLORPERAZINE EDISYLATE 10 MG/2ML IJ SOLN: 5.0000 mg | Freq: Four times a day (QID) | INTRAMUSCULAR | Status: DC | PRN

## 2023-05-28 MED ORDER — HYDROMORPHONE HCL 1 MG/ML IJ SOLN
0.2500 mg | INTRAMUSCULAR | Status: DC | PRN
  Administered 2023-05-28: 0.5 mg via INTRAVENOUS

## 2023-05-28 MED ORDER — PANTOPRAZOLE SODIUM 40 MG PO TBEC
40.0000 mg | DELAYED_RELEASE_TABLET | Freq: Every day | ORAL | Status: DC
  Administered 2023-05-28 – 2023-06-01 (×5): 40 mg via ORAL
  Filled 2023-05-28 (×6): qty 1

## 2023-05-28 MED ORDER — SACCHAROMYCES BOULARDII 250 MG PO CAPS
250.0000 mg | ORAL_CAPSULE | Freq: Two times a day (BID) | ORAL | Status: DC
  Administered 2023-05-29 – 2023-06-01 (×7): 250 mg via ORAL
  Filled 2023-05-28 (×7): qty 1

## 2023-05-28 MED ORDER — HYDROMORPHONE HCL 1 MG/ML IJ SOLN
INTRAMUSCULAR | Status: AC
  Filled 2023-05-28: qty 0.5

## 2023-05-28 MED ORDER — ACETAMINOPHEN 325 MG PO TABS: 650.0000 mg | ORAL_TABLET | Freq: Four times a day (QID) | ORAL | Status: DC | PRN

## 2023-05-28 MED ORDER — SODIUM CHLORIDE 0.9 % IR SOLN
Status: DC | PRN
  Administered 2023-05-28: 1000 mL

## 2023-05-28 MED ORDER — HYDROMORPHONE HCL 1 MG/ML IJ SOLN
INTRAMUSCULAR | Status: DC | PRN
  Administered 2023-05-28: .5 mg via INTRAVENOUS

## 2023-05-28 MED ORDER — SODIUM CHLORIDE 0.9 % IV SOLN
2.0000 g | INTRAVENOUS | Status: AC
  Administered 2023-05-28: 2 g via INTRAVENOUS
  Filled 2023-05-28: qty 2

## 2023-05-28 MED ORDER — ALUM & MAG HYDROXIDE-SIMETH 200-200-20 MG/5ML PO SUSP
30.0000 mL | Freq: Four times a day (QID) | ORAL | Status: DC | PRN
Start: 2023-05-28 — End: 2023-06-01
  Administered 2023-05-28: 30 mL via ORAL
  Filled 2023-05-28: qty 30

## 2023-05-28 MED ORDER — FENTANYL CITRATE (PF) 250 MCG/5ML IJ SOLN
INTRAMUSCULAR | Status: AC
  Filled 2023-05-28: qty 5

## 2023-05-28 MED ORDER — ORAL CARE MOUTH RINSE: 15.0000 mL | Freq: Once | OROMUCOSAL | Status: AC

## 2023-05-28 MED ORDER — FENTANYL CITRATE (PF) 250 MCG/5ML IJ SOLN
INTRAMUSCULAR | Status: DC | PRN
  Administered 2023-05-28 (×2): 50 ug via INTRAVENOUS
  Administered 2023-05-28: 150 ug via INTRAVENOUS

## 2023-05-28 MED ORDER — INSULIN ASPART 100 UNIT/ML IJ SOLN
INTRAMUSCULAR | Status: AC
  Filled 2023-05-28: qty 1

## 2023-05-28 MED ORDER — ENSURE PRE-SURGERY PO LIQD: 296.0000 mL | Freq: Once | ORAL | Status: DC

## 2023-05-28 MED ORDER — SODIUM CHLORIDE 0.9 % IV SOLN
2.0000 g | Freq: Two times a day (BID) | INTRAVENOUS | Status: AC
  Administered 2023-05-28: 2 g via INTRAVENOUS
  Filled 2023-05-28: qty 2

## 2023-05-28 MED ORDER — CHLORHEXIDINE GLUCONATE CLOTH 2 % EX PADS: 6.0000 | MEDICATED_PAD | Freq: Once | CUTANEOUS | Status: DC

## 2023-05-28 MED ORDER — OXYCODONE HCL 5 MG PO TABS
5.0000 mg | ORAL_TABLET | ORAL | Status: DC | PRN
  Administered 2023-05-28 – 2023-05-31 (×4): 10 mg via ORAL
  Filled 2023-05-28 (×4): qty 2

## 2023-05-28 MED ORDER — ONDANSETRON HCL 4 MG PO TABS: 4.0000 mg | ORAL_TABLET | Freq: Four times a day (QID) | ORAL | Status: DC | PRN

## 2023-05-28 MED ORDER — ONDANSETRON HCL 4 MG/2ML IJ SOLN
INTRAMUSCULAR | Status: DC | PRN
  Administered 2023-05-28: 4 mg via INTRAVENOUS

## 2023-05-28 MED ORDER — CHLORHEXIDINE GLUCONATE 0.12 % MT SOLN
15.0000 mL | Freq: Once | OROMUCOSAL | Status: AC
Start: 2023-05-28 — End: 2023-05-28
  Administered 2023-05-28: 15 mL via OROMUCOSAL
  Filled 2023-05-28: qty 15

## 2023-05-28 MED ORDER — ENSURE SURGERY PO LIQD
237.0000 mL | Freq: Two times a day (BID) | ORAL | Status: DC
  Administered 2023-05-31 – 2023-06-01 (×3): 237 mL via ORAL
  Filled 2023-05-28 (×9): qty 237

## 2023-05-28 MED ORDER — OXYCODONE HCL 5 MG/5ML PO SOLN: 5.0000 mg | Freq: Once | ORAL | Status: DC | PRN

## 2023-05-28 MED ORDER — LIDOCAINE HCL 1 % IJ SOLN
INTRAMUSCULAR | Status: DC | PRN
  Administered 2023-05-28: 10 mL via INTRAMUSCULAR

## 2023-05-28 MED ORDER — 0.9 % SODIUM CHLORIDE (POUR BTL) OPTIME
TOPICAL | Status: DC | PRN
  Administered 2023-05-28: 1000 mL

## 2023-05-28 MED ORDER — ALBUMIN HUMAN 5 % IV SOLN: INTRAVENOUS | Status: DC | PRN

## 2023-05-28 MED ORDER — SODIUM CHLORIDE 0.9 % IV SOLN
INTRAVENOUS | Status: DC
  Filled 2023-05-28: qty 6

## 2023-05-28 MED ORDER — OXYCODONE HCL 5 MG PO TABS: 5.0000 mg | ORAL_TABLET | Freq: Once | ORAL | Status: DC | PRN

## 2023-05-28 MED ORDER — PREDNISONE 5 MG PO TABS
5.0000 mg | ORAL_TABLET | Freq: Every day | ORAL | Status: DC
  Administered 2023-05-29 – 2023-06-01 (×4): 5 mg via ORAL
  Filled 2023-05-28 (×4): qty 1

## 2023-05-28 MED ORDER — BISACODYL 5 MG PO TBEC
20.0000 mg | DELAYED_RELEASE_TABLET | Freq: Every day | ORAL | Status: DC
Start: 2023-05-28 — End: 2023-06-01
  Administered 2023-05-28 – 2023-05-29 (×2): 20 mg via ORAL
  Filled 2023-05-28 (×5): qty 4

## 2023-05-28 MED ORDER — GABAPENTIN 300 MG PO CAPS
300.0000 mg | ORAL_CAPSULE | ORAL | Status: AC
  Administered 2023-05-28: 300 mg via ORAL
  Filled 2023-05-28: qty 1

## 2023-05-28 MED ORDER — ROCURONIUM BROMIDE 10 MG/ML (PF) SYRINGE
PREFILLED_SYRINGE | INTRAVENOUS | Status: DC | PRN
  Administered 2023-05-28: 50 mg via INTRAVENOUS
  Administered 2023-05-28: 80 mg via INTRAVENOUS
  Administered 2023-05-28: 20 mg via INTRAVENOUS

## 2023-05-28 MED ORDER — MIDAZOLAM HCL 2 MG/2ML IJ SOLN
INTRAMUSCULAR | Status: AC
Start: 2023-05-28 — End: ?
  Filled 2023-05-28: qty 2

## 2023-05-28 MED ORDER — PROPOFOL 10 MG/ML IV BOLUS
INTRAVENOUS | Status: DC | PRN
  Administered 2023-05-28: 200 mg via INTRAVENOUS

## 2023-05-28 MED ORDER — HYDROCHLOROTHIAZIDE 12.5 MG PO TABS
12.5000 mg | ORAL_TABLET | Freq: Every day | ORAL | Status: DC
  Administered 2023-05-28 – 2023-06-01 (×5): 12.5 mg via ORAL
  Filled 2023-05-28 (×5): qty 1

## 2023-05-28 MED ORDER — SIMETHICONE 80 MG PO CHEW: 40.0000 mg | CHEWABLE_TABLET | Freq: Four times a day (QID) | ORAL | Status: DC | PRN

## 2023-05-28 MED ORDER — METOPROLOL TARTRATE 5 MG/5ML IV SOLN
5.0000 mg | Freq: Four times a day (QID) | INTRAVENOUS | Status: DC | PRN
  Administered 2023-05-28 – 2023-05-29 (×2): 5 mg via INTRAVENOUS
  Filled 2023-05-28 (×2): qty 5

## 2023-05-28 MED ORDER — KETOROLAC TROMETHAMINE 30 MG/ML IJ SOLN: 30.0000 mg | Freq: Once | INTRAMUSCULAR | Status: DC | PRN

## 2023-05-28 MED ORDER — FOLIC ACID 1 MG PO TABS
1.0000 mg | ORAL_TABLET | Freq: Every day | ORAL | Status: DC
  Administered 2023-05-28 – 2023-06-01 (×5): 1 mg via ORAL
  Filled 2023-05-28 (×5): qty 1

## 2023-05-28 MED ORDER — PHENYLEPHRINE HCL-NACL 20-0.9 MG/250ML-% IV SOLN
INTRAVENOUS | Status: DC | PRN
  Administered 2023-05-28: 30 ug/min via INTRAVENOUS

## 2023-05-28 MED ORDER — ONDANSETRON HCL 4 MG/2ML IJ SOLN: 4.0000 mg | Freq: Once | INTRAMUSCULAR | Status: DC | PRN

## 2023-05-28 MED ORDER — ENOXAPARIN SODIUM 40 MG/0.4ML IJ SOSY
40.0000 mg | PREFILLED_SYRINGE | Freq: Once | INTRAMUSCULAR | Status: AC
  Administered 2023-05-28: 40 mg via SUBCUTANEOUS
  Filled 2023-05-28: qty 0.4

## 2023-05-28 MED ORDER — ACETAMINOPHEN 500 MG PO TABS
1000.0000 mg | ORAL_TABLET | ORAL | Status: AC
  Administered 2023-05-28: 1000 mg via ORAL
  Filled 2023-05-28: qty 2

## 2023-05-28 MED ORDER — MORPHINE SULFATE (PF) 2 MG/ML IV SOLN: 1.0000 mg | INTRAVENOUS | Status: DC | PRN

## 2023-05-28 MED ORDER — SODIUM CHLORIDE 0.9 % IV SOLN
INTRAVENOUS | Status: DC | PRN
  Administered 2023-05-28: 1000 mL via INTRAPERITONEAL

## 2023-05-28 MED ORDER — DEXAMETHASONE SODIUM PHOSPHATE 10 MG/ML IJ SOLN
INTRAMUSCULAR | Status: DC | PRN
  Administered 2023-05-28: 4 mg via INTRAVENOUS

## 2023-05-28 MED ORDER — KCL IN DEXTROSE-NACL 20-5-0.45 MEQ/L-%-% IV SOLN
INTRAVENOUS | Status: AC
  Filled 2023-05-28 (×3): qty 1000

## 2023-05-28 MED ORDER — ONDANSETRON HCL 4 MG/2ML IJ SOLN
4.0000 mg | Freq: Four times a day (QID) | INTRAMUSCULAR | Status: DC | PRN
  Administered 2023-05-29: 4 mg via INTRAVENOUS
  Filled 2023-05-28: qty 2

## 2023-05-28 MED ORDER — BUPIVACAINE-EPINEPHRINE (PF) 0.25% -1:200000 IJ SOLN
INTRAMUSCULAR | Status: AC
  Filled 2023-05-28: qty 30

## 2023-05-28 MED ORDER — HYDROXYCHLOROQUINE SULFATE 200 MG PO TABS
400.0000 mg | ORAL_TABLET | Freq: Every day | ORAL | Status: DC
Start: 2023-05-28 — End: 2023-06-01
  Administered 2023-05-28 – 2023-06-01 (×5): 400 mg via ORAL
  Filled 2023-05-28 (×5): qty 2

## 2023-05-28 MED ORDER — INSULIN ASPART 100 UNIT/ML IJ SOLN
0.0000 [IU] | INTRAMUSCULAR | Status: DC | PRN
  Administered 2023-05-28: 2 [IU] via SUBCUTANEOUS

## 2023-05-28 MED ORDER — PROCHLORPERAZINE MALEATE 10 MG PO TABS: 10.0000 mg | ORAL_TABLET | Freq: Four times a day (QID) | ORAL | Status: DC | PRN

## 2023-05-28 MED ORDER — DIPHENHYDRAMINE HCL 50 MG/ML IJ SOLN: 12.5000 mg | Freq: Four times a day (QID) | INTRAMUSCULAR | Status: DC | PRN

## 2023-05-28 MED ORDER — MIDAZOLAM HCL 2 MG/2ML IJ SOLN
INTRAMUSCULAR | Status: DC | PRN
  Administered 2023-05-28: 2 mg via INTRAVENOUS

## 2023-05-28 MED ORDER — BUPIVACAINE LIPOSOME 1.3 % IJ SUSP: 20.0000 mL | Freq: Once | INTRAMUSCULAR | Status: DC

## 2023-05-28 MED ORDER — SUGAMMADEX SODIUM 200 MG/2ML IV SOLN
INTRAVENOUS | Status: DC | PRN
  Administered 2023-05-28: 200 mg via INTRAVENOUS

## 2023-05-28 MED ORDER — SODIUM CHLORIDE 0.9 % IV SOLN
25.0000 ug/h | INTRAVENOUS | Status: DC
  Filled 2023-05-28: qty 1

## 2023-05-28 MED ORDER — LISINOPRIL-HYDROCHLOROTHIAZIDE 20-12.5 MG PO TABS: 1.0000 | ORAL_TABLET | Freq: Every day | ORAL | Status: DC

## 2023-05-28 MED ORDER — CELECOXIB 200 MG PO CAPS
200.0000 mg | ORAL_CAPSULE | Freq: Two times a day (BID) | ORAL | Status: DC
  Administered 2023-05-28 – 2023-05-31 (×7): 200 mg via ORAL
  Filled 2023-05-28 (×7): qty 1

## 2023-05-28 MED ORDER — INSULIN ASPART PROT & ASPART (70-30 MIX) 100 UNIT/ML ~~LOC~~ SUSP
6.0000 [IU] | Freq: Two times a day (BID) | SUBCUTANEOUS | Status: DC
  Administered 2023-05-28 – 2023-05-29 (×2): 6 [IU] via SUBCUTANEOUS
  Filled 2023-05-28: qty 10

## 2023-05-28 MED ORDER — PROPOFOL 10 MG/ML IV BOLUS
INTRAVENOUS | Status: AC
  Filled 2023-05-28: qty 20

## 2023-05-28 MED ORDER — ALVIMOPAN 12 MG PO CAPS
12.0000 mg | ORAL_CAPSULE | Freq: Two times a day (BID) | ORAL | Status: DC
  Administered 2023-05-29 – 2023-05-31 (×4): 12 mg via ORAL
  Filled 2023-05-28 (×8): qty 1

## 2023-05-28 MED ORDER — ENOXAPARIN SODIUM 40 MG/0.4ML IJ SOSY
40.0000 mg | PREFILLED_SYRINGE | INTRAMUSCULAR | Status: DC
  Administered 2023-05-29 – 2023-06-01 (×4): 40 mg via SUBCUTANEOUS
  Filled 2023-05-28 (×4): qty 0.4

## 2023-05-28 MED ORDER — MELATONIN 3 MG PO TABS: 3.0000 mg | ORAL_TABLET | Freq: Every evening | ORAL | Status: DC | PRN

## 2023-05-28 MED ORDER — LISINOPRIL 20 MG PO TABS
20.0000 mg | ORAL_TABLET | Freq: Every day | ORAL | Status: DC
  Administered 2023-05-29 – 2023-05-31 (×3): 20 mg via ORAL
  Filled 2023-05-28 (×4): qty 1

## 2023-05-28 MED ORDER — DIPHENHYDRAMINE HCL 12.5 MG/5ML PO ELIX: 12.5000 mg | ORAL_SOLUTION | Freq: Four times a day (QID) | ORAL | Status: DC | PRN

## 2023-05-28 MED ORDER — HYDROMORPHONE HCL 1 MG/ML IJ SOLN
INTRAMUSCULAR | Status: AC
  Filled 2023-05-28: qty 1

## 2023-05-28 SURGICAL SUPPLY — 63 items
BAG COUNTER SPONGE SURGICOUNT (BAG) ×2 IMPLANT
BENZOIN TINCTURE PRP APPL 2/3 (GAUZE/BANDAGES/DRESSINGS) IMPLANT
BLADE CLIPPER SURG (BLADE) IMPLANT
BLADE SURG 10 STRL SS (BLADE) IMPLANT
CANISTER SUCT 3000ML PPV (MISCELLANEOUS) ×2 IMPLANT
CHLORAPREP W/TINT 26 (MISCELLANEOUS) ×2 IMPLANT
COVER MAYO STAND STRL (DRAPES) ×2 IMPLANT
COVER SURGICAL LIGHT HANDLE (MISCELLANEOUS) ×4 IMPLANT
DRAPE HALF SHEET 40X57 (DRAPES) ×2 IMPLANT
DRAPE WARM FLUID 44X44 (DRAPES) ×2 IMPLANT
DRSG OPSITE POSTOP 4X6 (GAUZE/BANDAGES/DRESSINGS) IMPLANT
DRSG TEGADERM 2-3/8X2-3/4 SM (GAUZE/BANDAGES/DRESSINGS) IMPLANT
ELECT BLADE 6.5 EXT (BLADE) ×2 IMPLANT
ELECT CAUTERY BLADE 6.4 (BLADE) ×4 IMPLANT
ELECT REM PT RETURN 9FT ADLT (ELECTROSURGICAL) ×1
ELECTRODE REM PT RTRN 9FT ADLT (ELECTROSURGICAL) ×2 IMPLANT
GAUZE SPONGE 2X2 STRL 8-PLY (GAUZE/BANDAGES/DRESSINGS) IMPLANT
GEL ULTRASOUND 20GR AQUASONIC (MISCELLANEOUS) IMPLANT
GLOVE BIO SURGEON STRL SZ 6 (GLOVE) ×4 IMPLANT
GLOVE INDICATOR 6.5 STRL GRN (GLOVE) ×4 IMPLANT
GOWN STRL REUS W/ TWL LRG LVL3 (GOWN DISPOSABLE) ×8 IMPLANT
GOWN STRL REUS W/ TWL XL LVL3 (GOWN DISPOSABLE) ×4 IMPLANT
HANDLE SUCTION POOLE (INSTRUMENTS) ×2 IMPLANT
IRRIG SUCT STRYKERFLOW 2 WTIP (MISCELLANEOUS) ×1
IRRIGATION SUCT STRKRFLW 2 WTP (MISCELLANEOUS) ×2 IMPLANT
KIT BASIN OR (CUSTOM PROCEDURE TRAY) ×2 IMPLANT
KIT TURNOVER KIT B (KITS) ×2 IMPLANT
L-HOOK LAP DISP 36CM (ELECTROSURGICAL) ×1
LHOOK LAP DISP 36CM (ELECTROSURGICAL) ×2 IMPLANT
LIGASURE LAP MARYLAND 5MM 37CM (ELECTROSURGICAL) ×2 IMPLANT
NS IRRIG 1000ML POUR BTL (IV SOLUTION) ×4 IMPLANT
PAD ARMBOARD 7.5X6 YLW CONV (MISCELLANEOUS) ×4 IMPLANT
PENCIL BUTTON HOLSTER BLD 10FT (ELECTRODE) ×4 IMPLANT
RELOAD PROXIMATE 75MM BLUE (ENDOMECHANICALS) ×2
RELOAD STAPLE 75 3.8 BLU REG (ENDOMECHANICALS) IMPLANT
SCISSORS LAP 5X35 DISP (ENDOMECHANICALS) ×2 IMPLANT
SET TUBE SMOKE EVAC HIGH FLOW (TUBING) ×2 IMPLANT
SLEEVE Z-THREAD 5X100MM (TROCAR) ×2 IMPLANT
SPECIMEN JAR LARGE (MISCELLANEOUS) ×2 IMPLANT
STAPLER GUN LINEAR PROX 60 (STAPLE) IMPLANT
STAPLER PROXIMATE 75MM BLUE (STAPLE) IMPLANT
STAPLER VISISTAT 35W (STAPLE) ×2 IMPLANT
STRIP CLOSURE SKIN 1/2X4 (GAUZE/BANDAGES/DRESSINGS) IMPLANT
SUCTION POOLE HANDLE (INSTRUMENTS) ×2
SUT MNCRL AB 4-0 PS2 18 (SUTURE) IMPLANT
SUT PDS AB 1 CT 36 (SUTURE) IMPLANT
SUT VIC AB 2-0 SH 18 (SUTURE) ×2 IMPLANT
SUT VIC AB 3-0 SH 18 (SUTURE) ×2 IMPLANT
SUT VIC AB 3-0 SH 8-18 (SUTURE) IMPLANT
SUT VICRYL AB 2 0 TIES (SUTURE) ×2 IMPLANT
SUT VICRYL AB 3 0 TIES (SUTURE) ×2 IMPLANT
SYR BULB IRRIG 60ML STRL (SYRINGE) ×2 IMPLANT
SYS LAPSCP GELPORT 120MM (MISCELLANEOUS) ×1
SYSTEM LAPSCP GELPORT 120MM (MISCELLANEOUS) IMPLANT
TOWEL GREEN STERILE (TOWEL DISPOSABLE) ×4 IMPLANT
TRAY FOLEY MTR SLVR 16FR STAT (SET/KITS/TRAYS/PACK) ×2 IMPLANT
TRAY LAPAROSCOPIC MC (CUSTOM PROCEDURE TRAY) ×2 IMPLANT
TROCAR Z-THREAD OPTICAL 5X100M (TROCAR) ×2 IMPLANT
TUBE CONNECTING 12X1/4 (SUCTIONS) ×4 IMPLANT
TUBING EVAC SMOKE HEATED PNEUM (TUBING) ×2 IMPLANT
WARMER LAPAROSCOPE (MISCELLANEOUS) ×2 IMPLANT
WATER STERILE IRR 1000ML POUR (IV SOLUTION) ×2 IMPLANT
YANKAUER SUCT BULB TIP NO VENT (SUCTIONS) ×4 IMPLANT

## 2023-05-28 NOTE — Transfer of Care (Signed)
 Immediate Anesthesia Transfer of Care Note  Patient: Jeremiah Alexander  Procedure(s) Performed: ILEOCOLECTOMY LAPAROSCOPIC (Abdomen)  Patient Location: PACU  Anesthesia Type:General  Level of Consciousness: awake, alert , and oriented  Airway & Oxygen Therapy: Patient Spontanous Breathing and Patient connected to nasal cannula oxygen  Post-op Assessment: Report given to RN and Post -op Vital signs reviewed and stable  Post vital signs: Reviewed and stable  Last Vitals:  Vitals Value Taken Time  BP 167/109 05/28/23 1245  Temp    Pulse 77 05/28/23 1245  Resp 16 05/28/23 1245  SpO2 93 % 05/28/23 1245  Vitals shown include unfiled device data.  Last Pain:  Vitals:   05/28/23 0812  PainSc: 5       Patients Stated Pain Goal: 2 (05/28/23 9187)  Complications: No notable events documented.

## 2023-05-28 NOTE — Interval H&P Note (Signed)
 History and Physical Interval Note:  05/28/2023 9:56 AM  Jeremiah Alexander  has presented today for surgery, with the diagnosis of neuroendocrine tumor of the terminal ileum.  The various methods of treatment have been discussed with the patient and family. After consideration of risks, benefits and other options for treatment, the patient has consented to  Procedure(s): ILEOCOLECTOMY LAPAROSCOPIC (N/A) as a surgical intervention.  The patient's history has been reviewed, patient examined, no change in status, stable for surgery.  I have reviewed the patient's chart and labs.  Questions were answered to the patient's satisfaction.     [name]

## 2023-05-28 NOTE — Anesthesia Procedure Notes (Signed)
 Procedure Name: Intubation Date/Time: 05/28/2023 10:41 AM  Performed by: Lockie Flesher, CRNAPre-anesthesia Checklist: Patient identified, Emergency Drugs available, Suction available and Patient being monitored Patient Re-evaluated:Patient Re-evaluated prior to induction Oxygen Delivery Method: Circle System Utilized Preoxygenation: Pre-oxygenation with 100% oxygen Induction Type: IV induction Ventilation: Mask ventilation without difficulty Laryngoscope Size: Mac and 3 Grade View: Grade I Tube type: Oral Tube size: 7.0 mm Number of attempts: 1 Airway Equipment and Method: Stylet and Oral airway Placement Confirmation: ETT inserted through vocal cords under direct vision, positive ETCO2 and breath sounds checked- equal and bilateral Secured at: 25 cm Tube secured with: Tape Dental Injury: Teeth and Oropharynx as per pre-operative assessment

## 2023-05-28 NOTE — Anesthesia Postprocedure Evaluation (Signed)
 Anesthesia Post Note  Patient: Jeremiah Alexander  Procedure(s) Performed: ILEOCOLECTOMY LAPAROSCOPIC (Abdomen)     Patient location during evaluation: PACU Anesthesia Type: General Level of consciousness: sedated, patient cooperative and oriented Pain management: pain level controlled Vital Signs Assessment: post-procedure vital signs reviewed and stable Respiratory status: spontaneous breathing, nonlabored ventilation, respiratory function stable and patient connected to nasal cannula oxygen Cardiovascular status: blood pressure returned to baseline and stable Postop Assessment: no apparent nausea or vomiting Anesthetic complications: no   No notable events documented.  Last Vitals:  Vitals:   05/28/23 1515 05/28/23 1546  BP: 136/88 (!) 147/96  Pulse: 89 85  Resp: 16 14  Temp: 36.7 C 36.8 C  SpO2: 90% 95%    Last Pain:  Vitals:   05/28/23 1546  TempSrc: Oral  PainSc:                  Severin Bou,E. Delynn Olvera

## 2023-05-28 NOTE — Op Note (Signed)
 Laparoscopic Right Hemicolectomy Procedure Note  Indications: This patient presents for a laparoscopic partial colectomy for neuroendocrine tumor of the terminal ileum  Pre-operative Diagnosis:  Neuroendocrine tumor small bowel. [id number]  Post-operative Diagnosis:  Same  Surgeon: ARON SHOULDERS   Assistants: Waddell Collier, RNFA  Anesthesia: General endotracheal anesthesia and Local anesthesia 1% buffered lidocaine , 0.25.% bupivacaine , with epinephrine   ASA Class: 3  Procedure Details  The patient was seen in the Holding Room. The risks, benefits, complications, treatment options, and expected outcomes were discussed with the patient. The possibilities of reaction to medication, perforation of viscus, bleeding, recurrent infection, finding a normal colon, the need for additional procedures, failure to diagnose a condition, and creating a complication requiring transfusion or operation were discussed with the patient. The patient was advised of the risk of ostomy.  The patient concurred with the proposed plan, giving informed consent.   The patient was taken to the operating room, identified, and the procedure verified as partial colectomy. A Time Out was held and the above information confirmed.  The patient was brought to the operating room and placed supine. After induction of a general anesthetic, a Foley catheter was inserted and the abdomen was prepped and draped in standard fashion. The patient was placed into reverse Trendelenburg position and rotated to the right. Local anesthetic was introduced.  A small incision at the costal margin was made with a #11 blade. The 5 mm Optiview trocar was placed under direct visualization.  Pneumoperitoneum was insufflated to a pressure of 15 mm Hg. The laparoscope was introduced.    Exploration revealed a normal omentum, colon, small bowel, peritoneum, liver, and stomach. Two 5 mm left sided 5-mm trocars were then placed after anesthetizing the skin and  peritoneum with local anesthetic. The ascending colon and hepatic flexure were then mobilized with gentle retraction of the colon in a medial direction with mobilization of the peritoneal reflection with cautery and the Ligasure.  The omentum was taken off the transverse colon. A 6 cm vertical incision was made above the umbilicus.  The subcutaneous tissues were divided with the cautery.  The fascia was the cautery as well. The fascial incision was carried out point the skin incision. The GelPort was placed and the abdomen to protect the skin. The colon was evaluated to make sure it was adequately mobilized.  Mobilization of this area was complete to expose the retroperitoneum.  There was minimal blood loss during this portion of the procedure.      The colon protocol was utilized.  The colon was delivered through the incision.  The colon was resected with a linear stapling device proximal and distal to the area in question in regard to the specimen. The ligasure was used to divide the mesentery.  Several 3-0 sutures were used to reinforce the ligasure division line.  The specimen was submitted to pathology.      An side-to-side, functional end-to-end anastomosis was performed through the small anterior incision with the linear stapling device. The mucosa was inspected and found to be hemostatic. Closure was achieved with the linear stapling device. A 3-0 vicryl suture was used to reapproximate the angle of the anastomosis. Hemostasis was confirmed. The bowel anastomosis was returned.  The abdomen was irrigated with antibiotic irrigation.  The colon protocol was then followed to completion, taking off the laparoscopic instruments and the suction/bovie.     The fascial incision was then closed with a running #1 PDS suture. The gelport incision was closed with interrupted deep 3-0  vicryl sutures.  The skin of all the incisions was then closed with 4-0 monocryl subcuticular suture.  The wounds were then dressed  with benzoin, steristrips and soft sterile dressings.    Instrument, sponge, and needle counts were correct prior to abdominal closure and at the conclusion of the case.   Findings: The specimen was opened and a 1 cm rubbery tumor was found at the terminal ileum.    Estimated Blood Loss: 50 mL         Drains: none  Specimens: right colon with terminal ileum       Complications: None; patient tolerated the procedure well.         Disposition: PACU - hemodynamically stable.

## 2023-05-29 ENCOUNTER — Encounter (HOSPITAL_COMMUNITY): Payer: Self-pay | Admitting: General Surgery

## 2023-05-29 LAB — BASIC METABOLIC PANEL WITH GFR
Anion gap: 10 (ref 5–15)
BUN: 11 mg/dL (ref 6–20)
CO2: 25 mmol/L (ref 22–32)
Calcium: 8.9 mg/dL (ref 8.9–10.3)
Chloride: 97 mmol/L — ABNORMAL LOW (ref 98–111)
Creatinine, Ser: 1.05 mg/dL (ref 0.61–1.24)
GFR, Estimated: >60 mL/min (ref >60)
Glucose, Bld: 135 mg/dL — ABNORMAL HIGH (ref 70–99)
Potassium: 3.9 mmol/L (ref 3.5–5.1)
Sodium: 132 mmol/L — ABNORMAL LOW (ref 135–145)

## 2023-05-29 LAB — CBC
HCT: 39.1 % (ref 39.0–52.0)
Hemoglobin: 13.4 g/dL (ref 13.0–17.0)
MCH: 28.9 pg (ref 26.0–34.0)
MCHC: 34.3 g/dL (ref 30.0–36.0)
MCV: 84.4 fL (ref 80.0–100.0)
Platelets: 279 10*3/uL (ref 150–400)
RBC: 4.63 MIL/uL (ref 4.22–5.81)
RDW: 12.5 % (ref 11.5–15.5)
WBC: 9.6 10*3/uL (ref 4.0–10.5)
nRBC: 0 % (ref 0.0–0.2)

## 2023-05-29 LAB — GLUCOSE, CAPILLARY
Glucose-Capillary: 113 mg/dL — ABNORMAL HIGH (ref 70–99)
Glucose-Capillary: 126 mg/dL — ABNORMAL HIGH (ref 70–99)
Glucose-Capillary: 140 mg/dL — ABNORMAL HIGH (ref 70–99)
Glucose-Capillary: 149 mg/dL — ABNORMAL HIGH (ref 70–99)
Glucose-Capillary: 150 mg/dL — ABNORMAL HIGH (ref 70–99)

## 2023-05-29 MED ORDER — INSULIN ASPART 100 UNIT/ML IJ SOLN
0.0000 [IU] | INTRAMUSCULAR | Status: DC
  Administered 2023-05-29 – 2023-05-30 (×5): 1 [IU] via SUBCUTANEOUS
  Administered 2023-05-31: 9 [IU] via SUBCUTANEOUS
  Administered 2023-05-31 (×2): 1 [IU] via SUBCUTANEOUS
  Administered 2023-06-01: 2 [IU] via SUBCUTANEOUS

## 2023-05-29 NOTE — Inpatient Diabetes Management (Signed)
 Inpatient Diabetes Program Recommendations  AACE/ADA: New Consensus Statement on Inpatient Glycemic Control (2015)  Target Ranges:  Prepandial:   less than 140 mg/dL      Peak postprandial:   less than 180 mg/dL (1-2 hours)      Critically ill patients:  140 - 180 mg/dL   Lab Results  Component Value Date   GLUCAP 150 (H) 05/29/2023   HGBA1C 6.5 (H) 05/21/2023    Review of Glycemic Control  Latest Reference Range & Units 05/28/23 10:00 05/28/23 12:54 05/28/23 16:02 05/28/23 21:21 05/29/23 06:09  Glucose-Capillary 70 - 99 mg/dL 95 863 (H) 845 (H) 865 (H) 150 (H)   Diabetes history: DM  Outpatient Diabetes medications:  Novolog  70/30 12 units bid Metformin  500 mg bid Ozempic  0.25 mg weekly Current orders for Inpatient glycemic control:  Novolog  0-20 units tid with meals Novolog  70/30 mix- 6 units bid  Inpatient Diabetes Program Recommendations:    Consider d/c of 70/30 insulin  since patient not eating and reducing Novolog  correction to sensitive (0-9 units) but increase frequency to q 4 hours.   Thanks,  Randall Bullocks, RN, BC-ADM Inpatient Diabetes Coordinator Pager [phone]  (8a-5p)

## 2023-05-29 NOTE — Progress Notes (Signed)
 PT Cancellation Note  Patient Details Name: NICKOLAUS BORDELON MRN: [id number] DOB: November 28, 1970   Cancelled Treatment:    Reason Eval/Treat Not Completed: Other (comment) Pt declined due to abdominal pain; reports he has been ambulatory to and from bathroom.  Aleck Daring, PT, DPT Acute Rehabilitation Services Office [phone]    Alayne ONEIDA Daring 05/29/2023, 12:54 PM

## 2023-05-29 NOTE — Progress Notes (Signed)
 1 Day Post-Op   Subjective/Chief Complaint: Having some belching. No flatus yet.    Objective: Vital signs in last 24 hours: Temp:  [98 F (36.7 C)-98.6 F (37 C)] 98.6 F (37 C) (01/08 0514) Pulse Rate:  [72-94] 73 (01/08 0514) Resp:  [12-20] 18 (01/08 0514) BP: (129-175)/(84-109) 165/99 (01/08 0514) SpO2:  [90 %-98 %] 98 % (01/08 0514) Weight:  [132.9 kg-133.4 kg] 132.9 kg (01/08 0514) Last BM Date : 05/28/23  Intake/Output from previous day: 01/07 0701 - 01/08 0700 In: 2257.6 [P.O.:580; I.V.:1327.6; IV Piggyback:350] Out: 2200 [Urine:2075; Blood:125] Intake/Output this shift: No intake/output data recorded.  General:  A&O x 3, NAD Resp: breathing comfortably Abd:  soft, approp tender, sl distended Ext:  warm and well perfused.   Lab Results:  Recent Labs    05/28/23 1539  WBC 11.5*  HGB 14.4  HCT 43.0  PLT 290   BMET Recent Labs    05/28/23 1539  CREATININE 1.13   PT/INR No results for input(s): LABPROT, INR in the last 72 hours. ABG No results for input(s): PHART, HCO3 in the last 72 hours.  Invalid input(s): PCO2, PO2  Studies/Results: No results found.  Anti-infectives: Anti-infectives (From admission, onward)    Start     Dose/Rate Route Frequency Ordered Stop   05/28/23 2200  cefoTEtan  (CEFOTAN ) 2 g in sodium chloride  0.9 % 100 mL IVPB        2 g 200 mL/hr over 30 Minutes Intravenous Every 12 hours 05/28/23 1519 05/28/23 2150   05/28/23 1600  hydroxychloroquine  (PLAQUENIL ) tablet 400 mg        400 mg Oral Daily 05/28/23 1519     05/28/23 1152  clindamycin  (CLEOCIN ) 900 mg, gentamicin  (GARAMYCIN ) 240 mg in sodium chloride  0.9 % 1,000 mL for intraperitoneal lavage  Status:  Discontinued          As needed 05/28/23 1152 05/28/23 1401   05/28/23 0815  clindamycin  (CLEOCIN ) 900 mg, gentamicin  (GARAMYCIN ) 240 mg in sodium chloride  0.9 % 1,000 mL for intraperitoneal lavage  Status:  Discontinued         Irrigation To Surgery 05/28/23  0759 05/28/23 1519   05/28/23 0800  cefoTEtan  (CEFOTAN ) 2 g in sodium chloride  0.9 % 100 mL IVPB        2 g 200 mL/hr over 30 Minutes Intravenous On call to O.R. 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       LOS: 1 day    [name] 05/29/2023

## 2023-05-30 LAB — BASIC METABOLIC PANEL
Anion gap: 9 (ref 5–15)
BUN: 10 mg/dL (ref 6–20)
CO2: 25 mmol/L (ref 22–32)
Calcium: 9 mg/dL (ref 8.9–10.3)
Chloride: 99 mmol/L (ref 98–111)
Creatinine, Ser: 0.99 mg/dL (ref 0.61–1.24)
GFR, Estimated: >60 mL/min (ref >60)
Glucose, Bld: 127 mg/dL — ABNORMAL HIGH (ref 70–99)
Potassium: 4 mmol/L (ref 3.5–5.1)
Sodium: 133 mmol/L — ABNORMAL LOW (ref 135–145)

## 2023-05-30 LAB — GLUCOSE, CAPILLARY
Glucose-Capillary: 115 mg/dL — ABNORMAL HIGH (ref 70–99)
Glucose-Capillary: 119 mg/dL — ABNORMAL HIGH (ref 70–99)
Glucose-Capillary: 120 mg/dL — ABNORMAL HIGH (ref 70–99)
Glucose-Capillary: 140 mg/dL — ABNORMAL HIGH (ref 70–99)
Glucose-Capillary: 193 mg/dL — ABNORMAL HIGH (ref 70–99)

## 2023-05-30 LAB — CBC
HCT: 42.2 % (ref 39.0–52.0)
Hemoglobin: 14.7 g/dL (ref 13.0–17.0)
MCH: 29.5 pg (ref 26.0–34.0)
MCHC: 34.8 g/dL (ref 30.0–36.0)
MCV: 84.6 fL (ref 80.0–100.0)
Platelets: 308 10*3/uL (ref 150–400)
RBC: 4.99 MIL/uL (ref 4.22–5.81)
RDW: 12.3 % (ref 11.5–15.5)
WBC: 8.8 10*3/uL (ref 4.0–10.5)
nRBC: 0 % (ref 0.0–0.2)

## 2023-05-30 MED ORDER — ORAL CARE MOUTH RINSE: 15.0000 mL | OROMUCOSAL | Status: DC | PRN

## 2023-05-30 NOTE — Progress Notes (Signed)
 2 Days Post-Op   Subjective/Chief Complaint: + 2 BMs. Also had 1 episode of emesis.  Feels much better now and is passing some gas.    Objective: Vital signs in last 24 hours: Temp:  [97.8 F (36.6 C)-98.7 F (37.1 C)] 97.8 F (36.6 C) (01/09 0746) Pulse Rate:  [64-95] 64 (01/09 0746) Resp:  [16-18] 16 (01/09 0746) BP: (125-172)/(57-110) 125/57 (01/09 0746) SpO2:  [96 %-100 %] 97 % (01/09 0746) Last BM Date : 05/29/23  Intake/Output from previous day: 01/08 0701 - 01/09 0700 In: 480 [P.O.:480] Out: -  Intake/Output this shift: No intake/output data recorded.  General:  A&O x 3, NAD Resp: breathing comfortably Abd:  soft, approp tender, sl distended, dressings c/d/I.  Ext:  warm and well perfused.   Lab Results:  Recent Labs    05/29/23 0627 05/30/23 0516  WBC 9.6 8.8  HGB 13.4 14.7  HCT 39.1 42.2  PLT 279 308   BMET Recent Labs    05/29/23 0627 05/30/23 0516  NA 132* 133*  K 3.9 4.0  CL 97* 99  CO2 25 25  GLUCOSE 135* 127*  BUN 11 10  CREATININE 1.05 0.99  CALCIUM 8.9 9.0   PT/INR No results for input(s): LABPROT, INR in the last 72 hours. ABG No results for input(s): PHART, HCO3 in the last 72 hours.  Invalid input(s): PCO2, PO2  Studies/Results: No results found.  Anti-infectives: Anti-infectives (From admission, onward)    Start     Dose/Rate Route Frequency Ordered Stop   05/28/23 2200  cefoTEtan  (CEFOTAN ) 2 g in sodium chloride  0.9 % 100 mL IVPB        2 g 200 mL/hr over 30 Minutes Intravenous Every 12 hours 05/28/23 1519 05/28/23 2150   05/28/23 1600  hydroxychloroquine  (PLAQUENIL ) tablet 400 mg        400 mg Oral Daily 05/28/23 1519     05/28/23 1152  clindamycin  (CLEOCIN ) 900 mg, gentamicin  (GARAMYCIN ) 240 mg in sodium chloride  0.9 % 1,000 mL for intraperitoneal lavage  Status:  Discontinued          As needed 05/28/23 1152 05/28/23 1401   05/28/23 0815  clindamycin  (CLEOCIN ) 900 mg, gentamicin  (GARAMYCIN ) 240 mg in sodium  chloride 0.9 % 1,000 mL for intraperitoneal lavage  Status:  Discontinued         Irrigation To Surgery 05/28/23 0759 05/28/23 1519   05/28/23 0800  cefoTEtan  (CEFOTAN ) 2 g in sodium chloride  0.9 % 100 mL IVPB        2 g 200 mL/hr over 30 Minutes Intravenous On call to O.R. 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 Advance diet.   Hopefully home in next 1-2 days as long as he does ok with diet advance and oral pain meds.        LOS: 2 days    [name] 05/30/2023

## 2023-05-30 NOTE — Evaluation (Signed)
 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 PMH includes DM, HTN, PE, tobacco abuse   and RA.   Clinical Impression  Pt in bed upon arrival and agreeable to PT eval. Prior to admit, pt reports ambulating short distances with no AD. Pt reports using furniture and walls for UE support when having bad days. In today's session, pt was agreeable to ambulate ~30 ft with supervision for safety. Pt would reach for UE support when available. Discussed using a RW to increase stability and improve safety. Pt presents to therapy session with decreased LE strength, balance, activity tolerance, and mobility. Pt would benefit from acute skilled PT to address functional impairments. Recommending post-acute OP PT to work towards independence with mobility. Acute PT to follow.          If plan is discharge home, recommend the following: A little help with walking and/or transfers;Help with stairs or ramp for entrance;Assist for transportation   Can travel by private vehicle    Yes    Equipment Recommendations None recommended by PT     Functional Status Assessment Patient has had a recent decline in their functional status and demonstrates the ability to make significant improvements in function in a reasonable and predictable amount of time.     Precautions / Restrictions Precautions Precautions: None Precaution Comments: abdominal surgery Restrictions Weight Bearing Restrictions Per Provider Order: No      Mobility  Bed Mobility Overal bed mobility: Needs Assistance Bed Mobility: Supine to Sit    Supine to sit: Supervision, HOB elevated, Used rails    General bed mobility comments: cues for log roll technique for comfort, used rails to assist.  Effortful with increased time    Transfers Overall transfer level: Needs assistance Equipment used: None Transfers: Sit to/from Stand Sit to Stand: Supervision      General transfer comment: supervision for safety, able to stand w/ no AD, however, reached for UE support when available    Ambulation/Gait Ambulation/Gait assistance: Supervision Gait Distance (Feet): 30 Feet Assistive device:  (uses wall and couch) Gait Pattern/deviations: Step-through pattern, Antalgic, Wide base of support Gait velocity: decr     General Gait Details: antalgic gait due to pain in abdomen, reaches for UE support from wall and couch when available       Balance Overall balance assessment: Needs assistance, History of Falls, Mild deficits observed, not formally tested Sitting-balance support: No upper extremity supported, Feet supported Sitting balance-Leahy Scale: Good     Standing balance support: Single extremity supported, During functional activity Standing balance-Leahy Scale: Fair Standing balance comment: able to stand w/ no UE support, uses UE support when ambulating         Pertinent Vitals/Pain Pain Assessment Pain Assessment: Faces Faces Pain Scale: Hurts even more Pain Location: abdomen Pain Descriptors / Indicators: Aching, Discomfort Pain Intervention(s): Limited activity within patient's tolerance, Monitored during session, Repositioned    Home Living Family/patient expects to be discharged to:: Private residence Living Arrangements: Parent Available Help at Discharge: Family;Available 24 hours/day (no physical assistance available) Type of Home: House Home Access: Ramped entrance     Home Layout: One level Home Equipment: Agricultural [employer] (2 wheels)      Prior Function Prior Level of Function : Independent/Modified Independent;History of Falls (last six  months)      Mobility Comments: reports having good and bad days, does not use AD but will furniture surf. Has  falls in the past 6 months. Will get shooting pain in B LE's which causes him to fall if he does not sit down ADLs Comments: independent     Extremity/Trunk Assessment   Upper Extremity Assessment Upper Extremity Assessment: Overall WFL for tasks assessed    Lower Extremity Assessment Lower Extremity Assessment: Generalized weakness (denies n/t, reports having soreness in B knees for years)    Cervical / Trunk Assessment Cervical / Trunk Assessment: Normal  Communication   Communication Communication: No apparent difficulties  Cognition Arousal: Alert Behavior During Therapy: WFL for tasks assessed/performed Overall Cognitive Status: Within Functional Limits for tasks assessed     General Comments General comments (skin integrity, edema, etc.): VSS on RA        PT Assessment Patient needs continued PT services  PT Problem List Decreased strength;Decreased activity tolerance;Decreased balance;Decreased mobility       PT Treatment Interventions DME instruction;Gait training;Functional mobility training;Therapeutic activities;Therapeutic exercise;Balance training;Neuromuscular re-education;Patient/family education    PT Goals (Current goals can be found in the Care Plan section)  Acute Rehab PT Goals Patient Stated Goal: to go home PT Goal Formulation: With patient Time For Goal Achievement: 06/13/23 Potential to Achieve Goals: Good    Frequency Min 1X/week        AM-PAC PT 6 Clicks Mobility  Outcome Measure Help needed turning from your back to your side while in a flat bed without using bedrails?: None Help needed moving from lying on your back to sitting on the side of a flat bed without using bedrails?: A Little Help needed moving to and from a bed to a chair (including a wheelchair)?: A Little Help needed standing up from a chair using your arms (e.g., wheelchair or bedside chair)?: A Little Help needed to walk in hospital room?: A Little Help needed  climbing 3-5 steps with a railing? : A Little 6 Click Score: 19    End of Session Equipment Utilized During Treatment: Gait belt Activity Tolerance: Patient tolerated treatment well Patient left: in chair;with call bell/phone within reach Nurse Communication: Mobility status PT Visit Diagnosis: Unsteadiness on feet (R26.81);Repeated falls (R29.6);Muscle weakness (generalized) (M62.81)    Time: 9084-9073 PT Time Calculation (min) (ACUTE ONLY): 11 min   Charges:   PT Evaluation $PT Eval Low Complexity: 1 Low   PT General Charges $$ ACUTE PT VISIT: 1 Visit         Kate ORN, PT, DPT Secure Chat Preferred  Rehab Office [phone]  Kate BRAVO Wendolyn 05/30/2023, 9:58 AM

## 2023-05-31 ENCOUNTER — Other Ambulatory Visit (HOSPITAL_COMMUNITY): Payer: Self-pay

## 2023-05-31 LAB — BASIC METABOLIC PANEL
Anion gap: 12 (ref 5–15)
Anion gap: 8 (ref 5–15)
BUN: 21 mg/dL — ABNORMAL HIGH (ref 6–20)
BUN: 23 mg/dL — ABNORMAL HIGH (ref 6–20)
CO2: 24 mmol/L (ref 22–32)
CO2: 26 mmol/L (ref 22–32)
Calcium: 8.8 mg/dL — ABNORMAL LOW (ref 8.9–10.3)
Calcium: 8.6 mg/dL — ABNORMAL LOW (ref 8.9–10.3)
Chloride: 97 mmol/L — ABNORMAL LOW (ref 98–111)
Chloride: 100 mmol/L (ref 98–111)
Creatinine, Ser: 2.36 mg/dL — ABNORMAL HIGH (ref 0.61–1.24)
Creatinine, Ser: 2.26 mg/dL — ABNORMAL HIGH (ref 0.61–1.24)
GFR, Estimated: 32 mL/min — ABNORMAL LOW (ref >60)
GFR, Estimated: 34 mL/min — ABNORMAL LOW (ref >60)
Glucose, Bld: 114 mg/dL — ABNORMAL HIGH (ref 70–99)
Glucose, Bld: 178 mg/dL — ABNORMAL HIGH (ref 70–99)
Potassium: 4.1 mmol/L (ref 3.5–5.1)
Potassium: 4 mmol/L (ref 3.5–5.1)
Sodium: 133 mmol/L — ABNORMAL LOW (ref 135–145)
Sodium: 134 mmol/L — ABNORMAL LOW (ref 135–145)

## 2023-05-31 LAB — GLUCOSE, CAPILLARY
Glucose-Capillary: 117 mg/dL — ABNORMAL HIGH (ref 70–99)
Glucose-Capillary: 126 mg/dL — ABNORMAL HIGH (ref 70–99)
Glucose-Capillary: 128 mg/dL — ABNORMAL HIGH (ref 70–99)
Glucose-Capillary: 132 mg/dL — ABNORMAL HIGH (ref 70–99)
Glucose-Capillary: 133 mg/dL — ABNORMAL HIGH (ref 70–99)
Glucose-Capillary: 362 mg/dL — ABNORMAL HIGH (ref 70–99)

## 2023-05-31 LAB — CBC
HCT: 41.8 % (ref 39.0–52.0)
Hemoglobin: 14.2 g/dL (ref 13.0–17.0)
MCH: 29.2 pg (ref 26.0–34.0)
MCHC: 34 g/dL (ref 30.0–36.0)
MCV: 86 fL (ref 80.0–100.0)
Platelets: 284 10*3/uL (ref 150–400)
RBC: 4.86 MIL/uL (ref 4.22–5.81)
RDW: 12.2 % (ref 11.5–15.5)
WBC: 8 10*3/uL (ref 4.0–10.5)
nRBC: 0 % (ref 0.0–0.2)

## 2023-05-31 MED ORDER — SODIUM CHLORIDE 0.9 % IV BOLUS
1000.0000 mL | Freq: Once | INTRAVENOUS | Status: AC
  Administered 2023-05-31: 1000 mL via INTRAVENOUS

## 2023-05-31 MED ORDER — SODIUM CHLORIDE 0.9 % IV BOLUS
1000.0000 mL | Freq: Once | INTRAVENOUS | Status: DC
Start: 2023-05-31 — End: 2023-05-31

## 2023-05-31 MED ORDER — ONDANSETRON HCL 4 MG PO TABS
4.0000 mg | ORAL_TABLET | Freq: Four times a day (QID) | ORAL | 0 refills | Status: AC | PRN
  Filled 2023-05-31 – 2023-06-01 (×2): qty 20, 5d supply, fill #0

## 2023-05-31 MED ORDER — ACETAMINOPHEN 325 MG PO TABS: 650.0000 mg | ORAL_TABLET | Freq: Four times a day (QID) | ORAL | Status: AC | PRN

## 2023-05-31 MED ORDER — OXYCODONE HCL 5 MG PO TABS
5.0000 mg | ORAL_TABLET | ORAL | 0 refills | Status: AC | PRN
  Filled 2023-05-31 – 2023-06-01 (×2): qty 30, 3d supply, fill #0

## 2023-05-31 MED ORDER — GABAPENTIN 100 MG PO CAPS
200.0000 mg | ORAL_CAPSULE | Freq: Two times a day (BID) | ORAL | 1 refills | Status: AC
  Filled 2023-05-31 – 2023-06-01 (×2): qty 30, 8d supply, fill #0
  Filled 2023-07-16: qty 30, 8d supply, fill #1

## 2023-05-31 NOTE — Progress Notes (Addendum)
-   S/p ILEOCOLECTOMY LAPAROSCOPIC 1/7   05/31/23 1157  TOC Brief Assessment  Insurance and Status Reviewed (declined outpatient therapy)  Patient has primary care physician Yes  Home environment has been reviewed From home with family.  Prior level of function: PTA independent with ADL's, no DME usage  Prior/Current Home Services No current home services  Social Drivers of Health Review SDOH reviewed no interventions necessary  Readmission risk has been reviewed No  Transition of care needs no transition of care needs at this time   Pt declined outpatient therapy per recommendations from PT. Post hospital f/u noted on AVS. Pt without transportation issues or RX med concerns. Jon Hoit RN,BSN,CM

## 2023-05-31 NOTE — Progress Notes (Signed)
 3 Days Post-Op   Subjective/Chief Complaint: Having gas and BMs. Looking forward to solid food today.    Objective: Vital signs in last 24 hours: Temp:  [98.1 F (36.7 C)-98.9 F (37.2 C)] 98.1 F (36.7 C) (01/10 0756) Pulse Rate:  [74-97] 74 (01/10 0756) Resp:  [17-18] 18 (01/10 0756) BP: (97-155)/(69-97) 112/69 (01/10 0756) SpO2:  [97 %-99 %] 99 % (01/10 0756) Weight:  [132.8 kg] 132.8 kg (01/10 0411) Last BM Date : 05/29/23  Intake/Output from previous day: 01/09 0701 - 01/10 0700 In: 180 [P.O.:180] Out: -  Intake/Output this shift: No intake/output data recorded.  General:  A&O x 3, NAD Resp: breathing comfortably Abd:  soft, approp tender, non distended, dressings c/d/I.  Ext:  warm and well perfused.   Lab Results:  Recent Labs    05/30/23 0516 05/31/23 0617  WBC 8.8 8.0  HGB 14.7 14.2  HCT 42.2 41.8  PLT 308 284   BMET Recent Labs    05/31/23 0617 05/31/23 1040  NA 133* 134*  K 4.1 4.0  CL 97* 100  CO2 24 26  GLUCOSE 114* 178*  BUN 21* 23*  CREATININE 2.36* 2.26*  CALCIUM 8.8* 8.6*   PT/INR No results for input(s): LABPROT, INR in the last 72 hours. ABG No results for input(s): PHART, HCO3 in the last 72 hours.  Invalid input(s): PCO2, PO2  Studies/Results: No results found.  Anti-infectives: Anti-infectives (From admission, onward)    Start     Dose/Rate Route Frequency Ordered Stop   05/28/23 2200  cefoTEtan  (CEFOTAN ) 2 g in sodium chloride  0.9 % 100 mL IVPB        2 g 200 mL/hr over 30 Minutes Intravenous Every 12 hours 05/28/23 1519 05/28/23 2150   05/28/23 1600  hydroxychloroquine  (PLAQUENIL ) tablet 400 mg        400 mg Oral Daily 05/28/23 1519     05/28/23 1152  clindamycin  (CLEOCIN ) 900 mg, gentamicin  (GARAMYCIN ) 240 mg in sodium chloride  0.9 % 1,000 mL for intraperitoneal lavage  Status:  Discontinued          As needed 05/28/23 1152 05/28/23 1401   05/28/23 0815  clindamycin  (CLEOCIN ) 900 mg, gentamicin   (GARAMYCIN ) 240 mg in sodium chloride  0.9 % 1,000 mL for intraperitoneal lavage  Status:  Discontinued         Irrigation To Surgery 05/28/23 0759 05/28/23 1519   05/28/23 0800  cefoTEtan  (CEFOTAN ) 2 g in sodium chloride  0.9 % 100 mL IVPB        2 g 200 mL/hr over 30 Minutes Intravenous On call to O.R. 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 Advance diet.   AKI today- Cr up to 2.36 from normal.  resending to make sure this is true value.  Hold nephrotoxic meds including celebrex /lisinopril .   Give bolus.   Recheck tomorrow. Hope to d/c tomorrow as long as this improves and tolerates diet.       LOS: 3 days    [name] 05/31/2023

## 2023-05-31 NOTE — Discharge Instructions (Signed)
 [facility]      [facility], [place] [phone]  ABDOMINAL SURGERY: POST OP INSTRUCTIONS  Always review your discharge instruction sheet given to you by the facility where your surgery was performed.  IF YOU HAVE DISABILITY OR FAMILY LEAVE FORMS, YOU MUST BRING THEM TO THE OFFICE FOR PROCESSING.  PLEASE DO NOT GIVE THEM TO YOUR DOCTOR.  A prescription for pain medication may be given to you upon discharge.  Take your pain medication as prescribed, if needed.  If narcotic pain medicine is not needed, then you may take acetaminophen (Tylenol) or ibuprofen (Advil) as needed. Take your usually prescribed medications unless otherwise directed. If you need a refill on your pain medication, please contact your pharmacy. They will contact our office to request authorization.  Prescriptions will not be filled after 5pm or on week-ends. You should follow a light diet the first few days after arrival home, such as soup and crackers, pudding, etc.unless your doctor has advised otherwise. A high-fiber, low fat diet can be resumed as tolerated.   Be sure to include lots of fluids daily. Most patients will experience some swelling and bruising on the chest and neck area.  Ice packs will help.  Swelling and bruising can take several days to resolve Most patients will experience some swelling and bruising in the area of the incision. Ice pack will help. Swelling and bruising can take several days to resolve..  It is common to experience some constipation if taking pain medication after surgery.  Increasing fluid intake and taking a stool softener will usually help or prevent this problem from occurring.  A mild laxative (Milk of Magnesia or Miralax) should be taken according to package directions if there are no bowel movements after 48 hours.  You may have steri-strips (small skin tapes) in place directly over the incision.  These strips should be left on the skin for 10-14 days.  If your surgeon used skin glue on  the incision, you may shower in 48 hours.  The glue will flake off over the next 2-3 weeks.  Any sutures or staples will be removed at the office during your follow-up visit. You may find that a light gauze bandage over your incision may keep your staples from being rubbed or pulled. You may shower and replace the bandage daily. ACTIVITIES:  You may resume regular (light) daily activities beginning the next day--such as daily self-care, walking, climbing stairs--gradually increasing activities as tolerated.  You may have sexual intercourse when it is comfortable.  Refrain from any heavy lifting or straining until approved by your doctor. You may drive when you no longer are taking prescription pain medication, you can comfortably wear a seatbelt, and you can safely maneuver your car and apply brakes Return to Work: __________8 weeks if applicable_________________________ Jeremiah Alexander should see your doctor in the office for a follow-up appointment approximately two weeks after your surgery.  Make sure that you call for this appointment within a day or two after you arrive home to insure a convenient appointment time. OTHER INSTRUCTIONS:  _____________________________________________________________ _____________________________________________________________  WHEN TO CALL YOUR DOCTOR: Fever over 101.0 Inability to urinate Nausea and/or vomiting Extreme swelling or bruising Continued bleeding from incision. Increased pain, redness, or drainage from the incision. Difficulty swallowing or breathing Muscle cramping or spasms. Numbness or tingling in hands or feet or around lips.  The clinic staff is available to answer your questions during regular business hours.  Please don't hesitate to call and ask to speak to  one of the nurses if you have concerns.  For further questions, please visit [email or web address]

## 2023-05-31 NOTE — Progress Notes (Addendum)
 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 PMH includes DM, HTN, PE, tobacco abuse and RA.    PT Comments  Pt received in chair and pleasantly agreeable to therapy session with emphasis on transfer and gait trainign with rollator. Pt needed reminders for use of brakes prior to standing or sitting and demonstrates greatly improved standing activity tolerance with use of rollator and steady throughout, not needing physical assist with device. Pt requesting theraband HEP for his UE next session if possible. Discussed DME recommendation with pt and case manager and supervising PT Jeremiah Alexander and updated below.    If plan is discharge home, recommend the following: A little help with walking and/or transfers;Help with stairs or ramp for entrance;Assist for transportation;Assistance with cooking/housework   Can travel by [employer] (4 wheels) (would benefit from larger size if able due to height/weight)    Recommendations for Other Services       Precautions / Restrictions Precautions Precautions: None Precaution Comments: abdominal surgery, chronic R wrist discomfort from RA Restrictions Weight Bearing Restrictions Per Provider Order: No     Mobility  Bed Mobility Overal bed mobility: Needs Assistance             General bed mobility comments: pt received up in chair    Transfers Overall transfer level: Needs assistance Equipment used: Rollator (4 wheels) Transfers: Sit to/from Stand Sit to Stand: Supervision           General transfer comment: cues for safe UE placement and use of rollator brakes    Ambulation/Gait Ambulation/Gait assistance: Supervision Gait Distance (Feet): 180  Feet Assistive device: Rollator (4 wheels) Gait Pattern/deviations: Step-through pattern, Decreased stride length Gait velocity: decr     General Gait Details: pt with improved upright posture and activity tolerance this date using rollator and reports it is much easier to mobilize longer household distance using rollator.   Stairs             Wheelchair Mobility     Tilt Bed    Modified Rankin (Stroke Patients Only)       Balance Overall balance assessment: Needs assistance, History of Falls, Mild deficits observed, not formally tested Sitting-balance support: No upper extremity supported, Feet supported Sitting balance-Leahy Scale: Good     Standing balance support: Single extremity supported, During functional activity Standing balance-Leahy Scale: Fair Standing balance comment: Able to stand w/ no UE support with Fair balance, good balance using Rollator for dynamic tasks                            Cognition Arousal: Alert Behavior During Therapy: WFL for tasks assessed/performed Overall Cognitive Status: Within Functional Limits for tasks assessed                                 General Comments: Pt pleasantly cooperative        Exercises Other Exercises Other Exercises: Discussed chair push-up exercise as pt reports BUE weakness, but he states due to peripheral IV in both wrists/hands and chronic RUE pain from his RA, it was too painful. He may  benefit from theraband exercises for elbow/shoulder strengthening in future sessions    General Comments General comments (skin integrity, edema, etc.): SpO2 WFL on RA, HR ~118 bpm with exertion      Pertinent Vitals/Pain Pain Assessment Pain Assessment: Faces Faces Pain Scale: Hurts little more Pain Location: abdomen, R wrist with chair push-up (chronic R wrist pain per pt from RA), tricep soreness Pain Descriptors / Indicators: Aching, Discomfort, Grimacing, Sore Pain  Intervention(s): Monitored during session, Repositioned    Home Living                          Prior Function            PT Goals (current goals can now be found in the care plan section) Acute Rehab PT Goals Patient Stated Goal: to go home PT Goal Formulation: With patient Time For Goal Achievement: 06/13/23 Progress towards PT goals: Progressing toward goals    Frequency    Min 1X/week      PT Plan      Co-evaluation              AM-PAC PT 6 Clicks Mobility   Outcome Measure  Help needed turning from your back to your side while in a flat bed without using bedrails?: None Help needed moving from lying on your back to sitting on the side of a flat bed without using bedrails?: A Little Help needed moving to and from a bed to a chair (including a wheelchair)?: A Little Help needed standing up from a chair using your arms (e.g., wheelchair or bedside chair)?: A Little Help needed to walk in hospital room?: A Little Help needed climbing 3-5 steps with a railing? : A Little 6 Click Score: 19    End of Session Equipment Utilized During Treatment:  (pt defers gait belt) Activity Tolerance: Patient tolerated treatment well Patient left: in chair;with Alexander bell/phone within reach (OK to leave chair alarm off per RN) Nurse Communication: Mobility status;Other (comment) (case mgr notified of DME update) PT Visit Diagnosis: Unsteadiness on feet (R26.81);Repeated falls (R29.6);Muscle weakness (generalized) (M62.81)     Time: 8546-8492 PT Time Calculation (min) (ACUTE ONLY): 14 min  Charges:    $Gait Training: 8-22 mins PT General Charges $$ ACUTE PT VISIT: 1 Visit                     Jeremiah Massoud P., PTA Acute Rehabilitation Services Secure Chat Preferred 9a-5:30pm Office: [phone]    [facility] 05/31/2023, 3:20 PM  After discussing the patient's mobility and improvement with use of rollator this session with the PTA, I agree with updated DME  recommendations to include rollator.   Jeremiah Alexander, PT, DPT   Acute Rehabilitation Department Office [phone] Secure Chat Communication Preferred

## 2023-05-31 NOTE — Plan of Care (Signed)
  Problem: Education: Goal: Understanding of discharge needs will improve Outcome: Progressing Goal: Verbalization of understanding of the causes of altered bowel function will improve Outcome: Progressing   Problem: Activity: Goal: Ability to tolerate increased activity will improve Outcome: Progressing   Problem: Bowel/Gastric: Goal: Gastrointestinal status for postoperative course will improve Outcome: Progressing   Problem: Health Behavior/Discharge Planning: Goal: Identification of community resources to assist with postoperative recovery needs will improve Outcome: Progressing   Problem: Nutritional: Goal: Will attain and maintain optimal nutritional status will improve Outcome: Progressing   Problem: Clinical Measurements: Goal: Postoperative complications will be avoided or minimized Outcome: Progressing   Problem: Respiratory: Goal: Respiratory status will improve Outcome: Progressing   Problem: Skin Integrity: Goal: Will show signs of wound healing Outcome: Progressing   Problem: Education: Goal: Knowledge of General Education information will improve Description: Including pain rating scale, medication(s)/side effects and non-pharmacologic comfort measures Outcome: Progressing   Problem: Health Behavior/Discharge Planning: Goal: Ability to manage health-related needs will improve Outcome: Progressing   Problem: Clinical Measurements: Goal: Ability to maintain clinical measurements within normal limits will improve Outcome: Progressing Goal: Will remain free from infection Outcome: Progressing Goal: Diagnostic test results will improve Outcome: Progressing Goal: Respiratory complications will improve Outcome: Progressing Goal: Cardiovascular complication will be avoided Outcome: Progressing   Problem: Activity: Goal: Risk for activity intolerance will decrease Outcome: Progressing   Problem: Nutrition: Goal: Adequate nutrition will be  maintained Outcome: Progressing   Problem: Coping: Goal: Level of anxiety will decrease Outcome: Progressing   Problem: Elimination: Goal: Will not experience complications related to bowel motility Outcome: Progressing Goal: Will not experience complications related to urinary retention Outcome: Progressing   Problem: Pain Management: Goal: General experience of comfort will improve Outcome: Progressing   Problem: Safety: Goal: Ability to remain free from injury will improve Outcome: Progressing   Problem: Skin Integrity: Goal: Risk for impaired skin integrity will decrease Outcome: Progressing   Problem: Education: Goal: Ability to describe self-care measures that may prevent or decrease complications (Diabetes Survival Skills Education) will improve Outcome: Progressing Goal: Individualized Educational Video(s) Outcome: Progressing   Problem: Coping: Goal: Ability to adjust to condition or change in health will improve Outcome: Progressing   Problem: Fluid Volume: Goal: Ability to maintain a balanced intake and output will improve Outcome: Progressing   Problem: Health Behavior/Discharge Planning: Goal: Ability to identify and utilize available resources and services will improve Outcome: Progressing Goal: Ability to manage health-related needs will improve Outcome: Progressing   Problem: Metabolic: Goal: Ability to maintain appropriate glucose levels will improve Outcome: Progressing   Problem: Nutritional: Goal: Maintenance of adequate nutrition will improve Outcome: Progressing Goal: Progress toward achieving an optimal weight will improve Outcome: Progressing   Problem: Skin Integrity: Goal: Risk for impaired skin integrity will decrease Outcome: Progressing   Problem: Tissue Perfusion: Goal: Adequacy of tissue perfusion will improve Outcome: Progressing

## 2023-06-01 ENCOUNTER — Other Ambulatory Visit (HOSPITAL_COMMUNITY): Payer: Self-pay

## 2023-06-01 LAB — CBC
HCT: 37.1 % — ABNORMAL LOW (ref 39.0–52.0)
Hemoglobin: 12.9 g/dL — ABNORMAL LOW (ref 13.0–17.0)
MCH: 29.7 pg (ref 26.0–34.0)
MCHC: 34.8 g/dL (ref 30.0–36.0)
MCV: 85.5 fL (ref 80.0–100.0)
Platelets: 266 10*3/uL (ref 150–400)
RBC: 4.34 MIL/uL (ref 4.22–5.81)
RDW: 12.2 % (ref 11.5–15.5)
WBC: 8.1 10*3/uL (ref 4.0–10.5)
nRBC: 0 % (ref 0.0–0.2)

## 2023-06-01 LAB — GLUCOSE, CAPILLARY
Glucose-Capillary: 99 mg/dL (ref 70–99)
Glucose-Capillary: 204 mg/dL — ABNORMAL HIGH (ref 70–99)

## 2023-06-01 LAB — BASIC METABOLIC PANEL
Anion gap: 7 (ref 5–15)
BUN: 27 mg/dL — ABNORMAL HIGH (ref 6–20)
CO2: 24 mmol/L (ref 22–32)
Calcium: 8.1 mg/dL — ABNORMAL LOW (ref 8.9–10.3)
Chloride: 101 mmol/L (ref 98–111)
Creatinine, Ser: 1.6 mg/dL — ABNORMAL HIGH (ref 0.61–1.24)
GFR, Estimated: 51 mL/min — ABNORMAL LOW (ref >60)
Glucose, Bld: 113 mg/dL — ABNORMAL HIGH (ref 70–99)
Potassium: 3.8 mmol/L (ref 3.5–5.1)
Sodium: 132 mmol/L — ABNORMAL LOW (ref 135–145)

## 2023-06-01 NOTE — Progress Notes (Addendum)
 Discharge instructions discussed with patient, all questions answered. TOC meds delivered to patient room.Nurse called to arrange transportation home. 2 IV's removed. Per patient request Patient walked off unit to lobby to wait for ride.   Nare Gaspari, RN

## 2023-06-01 NOTE — Discharge Summary (Signed)
 Physician Discharge Summary   Patient ID: Jeremiah Alexander MRN: [id number] DOB/AGE: [birth year] 53 y.o.  Admit date: 05/28/2023  Discharge date: 06/01/2023  Discharge Diagnoses:  Principal Problem:   Neuroendocrine tumor of pancreas Active Problems:   Primary malignant neuroendocrine neoplasm of ileum [facility])   Discharged Condition: good  Hospital Course: Patient was admitted for observation following ileocectomy for neuroendocrine tumor.  Post op course was complicated by mild AKI which has resolved with fluid resuscitation.  Pain was well controlled.  Tolerated diet and bowel function returned.  Patient was prepared for discharge home on POD#4.  Consults: None  Treatments: surgery: ileocecectomy for neuroendocrine tumor  Discharge Exam: Blood pressure 116/75, pulse 72, temperature 98.5 F (36.9 C), temperature source Oral, resp. rate 18, height 6' (1.829 m), weight 132.8 kg, SpO2 99%. HEENT - clear Abd - soft without distension; non-tender; wounds dry and intact with intact dressings.  Disposition: Home  Discharge Instructions     Diet - low sodium heart healthy   Complete by: As directed    Discharge wound care:   Complete by: As directed    May shower.  May remove dressings from abdomen when no longer sealed.  No new dressings required.   Increase activity slowly   Complete by: As directed       Allergies as of 06/01/2023   No Known Allergies      Medication List     TAKE these medications    acetaminophen  325 MG tablet Commonly known as: TYLENOL  Take 2 tablets (650 mg total) by mouth every 6 (six) hours as needed (fever > 101).   bisacodyl  5 MG EC tablet Generic drug: bisacodyl  Take 4 tablets (20 mg total) by mouth daily as needed for constipation for up to 1 dose.   Blood Pressure Monitor Devi Use to check blood pressure daily. Hypertension I10.0   Eliquis  5 MG Tabs tablet Generic drug: apixaban  Take 1 tablet (5 mg total) by mouth 2 (two) times  daily.   Enbrel  Mini 50 MG/ML injection Generic drug: etanercept  Inject 50 mg into the skin once a week.   folic acid  1 MG tablet Commonly known as: FOLVITE  Take 1 tablet (1 mg total) by mouth daily.   gabapentin  100 MG capsule Commonly known as: NEURONTIN  Take 2 capsules (200 mg total) by mouth 2 (two) times daily.   GaviLAX 17 GM/SCOOP powder Generic drug: polyethylene glycol powder Take by mouth for one dose, according to procedure prep instructions.   HumaLOG  Mix 75/25 KwikPen (75-25) 100 UNIT/ML KwikPen Generic drug: Insulin  Lispro Prot & Lispro Inject 12 Units into the skin 2 (two) times daily with a meal.   hydroxychloroquine  200 MG tablet Commonly known as: PLAQUENIL  Take 2 tablets (400 mg total) by mouth daily.   insulin  aspart protamine- aspart (70-30) 100 UNIT/ML injection Commonly known as: NOVOLOG  MIX 70/30 Inject 12 Units into the skin 2 (two) times daily.   lisinopril -hydrochlorothiazide  20-12.5 MG tablet Commonly known as: Zestoretic  Take 1 tablet by mouth daily.   metFORMIN  500 MG tablet Commonly known as: GLUCOPHAGE  Take 1 tablet (500 mg total) by mouth 2 (two) times daily with a meal.   methotrexate  2.5 MG tablet Commonly known as: RHEUMATREX Take 8 tablets (20 mg total) by mouth once a week. Caution:Chemotherapy. Protect from light.   metroNIDAZOLE  500 MG tablet Commonly known as: FLAGYL  Take 2 tablets (1,000 mg total) by mouth 3 (three) times daily for 3 doses, take according to your procedure colon prep  instructions.   neomycin  500 MG tablet Commonly known as: MYCIFRADIN  Take 2 tablets (1,000 mg total) by mouth 3 (three) times daily for 3 doses, take according to your procedure colon prep instruction.   ondansetron  4 MG tablet Commonly known as: ZOFRAN  Take 1 tablet (4 mg total) by mouth every 6 (six) hours as needed for nausea.   OneTouch Delica Plus Lancet30G Misc use as directed to check blood sugars up to 4 times daily   OneTouch  Verio Flex System w/Device Kit Use up to four times daily as directed.   OneTouch Verio test strip Generic drug: glucose blood use as directed to check blood sugars up to 4 times daily   oxyCODONE  5 MG immediate release tablet Commonly known as: Oxy IR/ROXICODONE  Take 1-2 tablets (5-10 mg total) by mouth every 4 (four) hours as needed for moderate pain (pain score 4-6) (do not give if patient is nauseated.).   Ozempic  (0.25 or 0.5 MG/DOSE) 2 MG/3ML Sopn Generic drug: Semaglutide (0.25 or 0.5MG /DOS) Inject 0.25 mg into the skin once a week.   pantoprazole  40 MG tablet Commonly known as: Protonix  Take 1 tablet (40 mg total) by mouth daily.   predniSONE  5 MG tablet Commonly known as: DELTASONE  Take 1 tablet (5 mg total) by mouth daily with breakfast.   sertraline  25 MG tablet Commonly known as: Zoloft  Take 1 tablet (25 mg total) by mouth daily.   TechLite Pen Needles 31G X 8 MM Misc Generic drug: Insulin  Pen Needle Use as needed to inject insulin .               Discharge Care Instructions  (From admission, onward)           Start     Ordered   06/01/23 0000  Discharge wound care:       Comments: May shower.  May remove dressings from abdomen when no longer sealed.  No new dressings required.   06/01/23 1105            Follow-up Information     Renne Homans, MD Follow up.   Specialty: Internal Medicine Contact information: [address] [place] [address] [phone]         Aron Shoulders, MD Follow up in 2 week(s).   Specialty: General Surgery Contact information: [address] [place] [address] [place] [address] [phone]                 Krystal Spinner, MD [facility] Office: [phone]   Signed: Krystal Spinner 06/01/2023, 11:05 AM

## 2023-06-03 ENCOUNTER — Other Ambulatory Visit (HOSPITAL_COMMUNITY): Payer: Self-pay

## 2023-06-03 ENCOUNTER — Encounter: Payer: Medicaid Other | Admitting: Physical Therapy

## 2023-06-04 LAB — SURGICAL PATHOLOGY

## 2023-06-10 ENCOUNTER — Other Ambulatory Visit: Payer: Self-pay

## 2023-06-11 ENCOUNTER — Encounter: Payer: Medicaid Other | Admitting: Physical Therapy

## 2023-06-11 ENCOUNTER — Other Ambulatory Visit: Payer: Self-pay | Admitting: General Surgery

## 2023-06-11 DIAGNOSIS — D3A8 Other benign neuroendocrine tumors: Secondary | ICD-10-CM

## 2023-06-11 DIAGNOSIS — Z809 Family history of malignant neoplasm, unspecified: Secondary | ICD-10-CM

## 2023-06-12 ENCOUNTER — Other Ambulatory Visit: Payer: Self-pay

## 2023-06-12 ENCOUNTER — Other Ambulatory Visit: Payer: Self-pay | Admitting: Internal Medicine

## 2023-06-12 ENCOUNTER — Inpatient Hospital Stay: Payer: Medicaid Other | Attending: Genetic Counselor | Admitting: Genetic Counselor

## 2023-06-12 DIAGNOSIS — Z1379 Encounter for other screening for genetic and chromosomal anomalies: Secondary | ICD-10-CM

## 2023-06-12 DIAGNOSIS — Z111 Encounter for screening for respiratory tuberculosis: Secondary | ICD-10-CM

## 2023-06-12 DIAGNOSIS — M059 Rheumatoid arthritis with rheumatoid factor, unspecified: Secondary | ICD-10-CM

## 2023-06-12 MED ORDER — ENBREL MINI 50 MG/ML ~~LOC~~ SOCT
50.0000 mg | SUBCUTANEOUS | 0 refills | Status: DC
  Filled 2023-06-12: qty 4, 28d supply, fill #0

## 2023-06-12 NOTE — Progress Notes (Signed)
Specialty Pharmacy Refill Coordination Note  Jeremiah Alexander is a 53 y.o. male contacted today regarding refills of specialty medication(s) Etanercept (Enbrel Mini)   Patient requested Delivery   Delivery date: 06/21/23   Verified address: [address] [place], [place] [address]   Medication will be filled on 06/20/23, pending refill approval.

## 2023-06-12 NOTE — Telephone Encounter (Signed)
Last Fill: 01/17/2023  Labs: 06/01/2023 Hemoglobin 12.9 HCT 37.1 Sodium 132 Glucose 113 BUN 27 Creatinine 1.60 Calcium 8.1 GFR 51  TB Gold: 05/31/2022 Negative   Next Visit: Due 04/19/2023. Message sent to the front to schedule.   Last Visit: 01/17/2023  XL:[id number] rheumatoid arthritis   Current Dose per office note 01/17/2023: Enbrel 50 mg subcu weekly   Patient advised he is due to update his TB Gold test. Patient states he can come into the office to get it done. Advised the patient he needs a follow up appointment. Patient states he is going to give the office a call back to schedule an appointment.   Okay to refill Enbrel?

## 2023-06-12 NOTE — Telephone Encounter (Signed)
Please schedule patient a follow up visit. Patient due 04/19/2023. Thanks!  Advised the patient he needs a follow up appointment. Patient states he is going to give the office a call back to schedule an appointment.

## 2023-06-12 NOTE — Progress Notes (Signed)
GENETIC TEST RESULTS   Patient Name: Jeremiah Alexander Patient Age: 53 y.o. 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 Please refer to the prior Genetics clinic note for more information regarding Jeremiah Alexander medical and family histories and our assessment at the time.   FAMILY HISTORY:  We obtained a detailed, 4-generation family history.  Significant diagnoses are listed below: Family History  Problem Relation Age of Onset   Rheum arthritis Mother    Breast cancer Maternal Aunt        dx. > 50   Throat cancer Maternal Uncle        smoker   Prostate cancer Maternal Uncle    Prostate cancer Maternal Uncle    Breast cancer Maternal Grandmother    Diabetes Other    Breast cancer Other        MGF's sister dx over 67   Breast cancer Half-Sister 52   Colon cancer Half-Sister 61   Breast cancer Maternal Great-grandmother        MGF's mother dx over 38   Esophageal cancer Neg Hx    Rectal cancer Neg Hx    Stomach cancer Neg Hx        The patient has one daughter who is cancer free.  He has two maternal half sisters, one sister had breast cancer at 55 and colon cancer at 13 and reportedly tested negative for BRCA1 and 2.  His father is deceased and his mother is living.   There is no information on the paternal side of the family.   The patient's mother is living at 45.  She had four brothers and a sister. Her sister developed breast cancer and is deceased, two brothers have prostate cancer and one brother died of throat cancer.  Both of her parents are deceased.  Her father had a sister with breast cancer and his mother also had breast cancer.   Jeremiah Alexander is aware of previous family history of genetic testing for hereditary cancer risks.  There is no reported Ashkenazi Jewish ancestry.  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  The test report has been scanned into EPIC and is located under the Molecular Pathology section of the Results Review tab.  A portion of the result report is included below for reference. Genetic testing reported out on May 16, 2023.     Clinical Information: BRIP1 pathogenic variants are characterized by an increased lifetime risk for, generally, adult-onset ovarian cancer.  At this time we do not feel that the risk for other cancers is increased. This result does not explain Jeremiah Alexander neuroendocrine tumor.  The cancers associated with BRIP1 are:  Ovarian cancer, 5-15% risk Not enough data suggest an increased risk for breast or other cancers    Management Recommendations:  Gynecological Cancer Screening/Risk Reduction: It is recommended that women with a BRIP1 mutation consider having a risk-reducing salpingo oophorectomy (RRSO), removal of the ovaries and fallopian tubes, between the ages of 63-50 or once childbearing is completed. Having a RRSO is estimated to reduce the risk of ovarian cancer by up to 96%. There is still a small risk of developing an "ovarian-like" cancer in the  lining of the abdomen, called the peritoneum. Women undergoing a RRSO should be aware of the potential risks and benefits of concurrent hysterectomy. Hormone replacement therapy could be considered based on the physician's discretion. Individuals at risk for developing ovarian cancer may benefit from the use of medication to reduce their risk for cancer. These medications are referred to as chemoprevention. For example, oral contraceptive use has been shown to reduce the risk of ovarian cancer by approximately 60% in BRCA mutation carrier population if taken for at least 5 years. This risk reduction remains even after  discontinuation of oral contraceptives. Ovarian cancer screening is an option for women who chose not to have a RRSO or who, as of yet, have not completed their family. Current screening methods for ovarian cancer are neither sensitive nor specific, meaning that often early stage ovarian cancer cannot be diagnosed through this screening.  Screening can also be falsely positive with no cancer present. For this reason, RRSO is recommended over screening. If ovarian cancer screening is recommended by your physician, it could include: CA-125 blood tests Transvaginal ultrasounds Clinical pelvic exams   This information is based on current understanding of the gene and may change in the future.   Implications for Family Members: Hereditary predisposition to cancer due to pathogenic variants in the BRIP1 gene has autosomal dominant inheritance. This means that an individual with a pathogenic variant has a 50% chance of passing the condition on to his/her offspring. Identification of a pathogenic variant allows for the recognition of at-risk relatives who can pursue testing for the familial variant.  We recommend that Jeremiah Alexander mother undergo genetic testing to determine which side of the family the BRIP1 mutation is coming from.  Jeremiah Alexander does not have information on his biological father's side of the family but is close with his mother's side.  Jeremiah Alexander maternal half sister's each have a 25% chance for having the BRIP1 mutation.  Family members are encouraged to consider genetic testing for this familial pathogenic variant. As there are generally no childhood cancer risks associated with pathogenic variants in the BRIP1 gene, individuals in the family are not recommended to have testing until they reach at least 55 years of age. They may contact our office at [phone] for more information or to schedule an appointment.  Complimentary testing for the familial variant is available for 150 days after  the report date.  Family members who live outside of the area are encouraged to find a genetic counselor in their area by visiting: [email or web address].  We encouraged Jeremiah Alexander to remain in contact with [place] on an annual basis so we can update his personal and family histories, and let him know of advances in cancer genetics that may benefit the family. Our contact number was provided. Jeremiah Alexander questions were answered to his satisfaction today, and he knows he is welcome to call anytime with additional questions.   Alyene Predmore P. Lowell Guitar, MS, [facility] Licensed, Patent attorney [email or web address] phone: [phone]  50 minutes were spent on the date of the encounter in service to the patient including preparation, face-to-face consultation, documentation and care coordination.

## 2023-06-20 ENCOUNTER — Other Ambulatory Visit: Payer: Self-pay

## 2023-06-21 ENCOUNTER — Encounter: Payer: Self-pay | Admitting: *Deleted

## 2023-06-21 ENCOUNTER — Other Ambulatory Visit (HOSPITAL_COMMUNITY): Payer: Self-pay

## 2023-06-21 MED ORDER — SIMETHICONE 80 MG PO CHEW
80.0000 mg | CHEWABLE_TABLET | Freq: Four times a day (QID) | ORAL | 0 refills | Status: AC | PRN
  Filled 2023-06-21: qty 30, 8d supply, fill #0

## 2023-06-21 MED ORDER — BENEFIBER PO CHEW
2.0000 | CHEWABLE_TABLET | Freq: Two times a day (BID) | ORAL | 2 refills | Status: AC
  Filled 2023-06-21: qty 120, 60d supply, fill #0
  Filled 2023-07-16: qty 120, 30d supply, fill #0

## 2023-06-21 MED ORDER — HYDROCORTISONE 2.5 % EX CREA
1.0000 | TOPICAL_CREAM | Freq: Three times a day (TID) | CUTANEOUS | 0 refills | Status: AC
  Filled 2023-06-21 – 2023-07-16 (×2): qty 30, 10d supply, fill #0

## 2023-06-21 NOTE — Progress Notes (Signed)
PATIENT NAVIGATOR PROGRESS NOTE  Name: Jeremiah Alexander Date: 06/21/2023 MRN: [id number]  DOB: Dec 21, 1970   Reason for visit:  New patient appt  Comments:  Called and spoke with [name] regarding referral from Dr Donell Beers. He is now scheduled with Dr Truett Perna on 07/09/23 at 1:40pm.  Reviewed directions to building and parking Verbalized understanding    Time spent counseling/coordinating care: 30-45 minutes

## 2023-07-02 ENCOUNTER — Ambulatory Visit (HOSPITAL_COMMUNITY)
Admission: RE | Admit: 2023-07-02 | Discharge: 2023-07-02 | Disposition: A | Discharge status: Home / Self Care | Payer: Medicaid Other | Source: Ambulatory Visit | Attending: General Surgery | Admitting: General Surgery

## 2023-07-02 DIAGNOSIS — Z809 Family history of malignant neoplasm, unspecified: Secondary | ICD-10-CM | POA: Insufficient documentation

## 2023-07-02 DIAGNOSIS — D3A8 Other benign neuroendocrine tumors: Secondary | ICD-10-CM | POA: Insufficient documentation

## 2023-07-02 DIAGNOSIS — R918 Other nonspecific abnormal finding of lung field: Secondary | ICD-10-CM | POA: Diagnosis not present

## 2023-07-02 MED ORDER — COPPER CU 64 DOTATATE 1 MCI/ML IV SOLN
4.0000 | Freq: Once | INTRAVENOUS | Status: AC
  Administered 2023-07-02: 4.34 via INTRAVENOUS

## 2023-07-03 ENCOUNTER — Other Ambulatory Visit (HOSPITAL_COMMUNITY): Payer: Self-pay

## 2023-07-09 ENCOUNTER — Encounter: Payer: Self-pay | Admitting: *Deleted

## 2023-07-09 ENCOUNTER — Encounter: Payer: Self-pay | Admitting: Nutrition

## 2023-07-09 ENCOUNTER — Inpatient Hospital Stay: Payer: Medicaid Other

## 2023-07-09 ENCOUNTER — Inpatient Hospital Stay: Payer: Medicaid Other | Attending: Genetic Counselor | Admitting: Oncology

## 2023-07-09 ENCOUNTER — Other Ambulatory Visit: Payer: Self-pay

## 2023-07-09 VITALS — BP 132/90 | HR 93 | Temp 97.9°F | Resp 18 | Ht 72.0 in | Wt 286.0 lb

## 2023-07-09 DIAGNOSIS — F17210 Nicotine dependence, cigarettes, uncomplicated: Secondary | ICD-10-CM | POA: Diagnosis not present

## 2023-07-09 DIAGNOSIS — Z86711 Personal history of pulmonary embolism: Secondary | ICD-10-CM | POA: Diagnosis not present

## 2023-07-09 DIAGNOSIS — Z800 Family history of malignant neoplasm of digestive organs: Secondary | ICD-10-CM | POA: Diagnosis not present

## 2023-07-09 DIAGNOSIS — C7B01 Secondary carcinoid tumors of distant lymph nodes: Secondary | ICD-10-CM | POA: Diagnosis not present

## 2023-07-09 DIAGNOSIS — Z89421 Acquired absence of other right toe(s): Secondary | ICD-10-CM | POA: Diagnosis not present

## 2023-07-09 DIAGNOSIS — Z803 Family history of malignant neoplasm of breast: Secondary | ICD-10-CM

## 2023-07-09 DIAGNOSIS — M069 Rheumatoid arthritis, unspecified: Secondary | ICD-10-CM

## 2023-07-09 DIAGNOSIS — D3A8 Other benign neuroendocrine tumors: Secondary | ICD-10-CM

## 2023-07-09 DIAGNOSIS — E119 Type 2 diabetes mellitus without complications: Secondary | ICD-10-CM | POA: Diagnosis not present

## 2023-07-09 DIAGNOSIS — C7A012 Malignant carcinoid tumor of the ileum: Secondary | ICD-10-CM | POA: Diagnosis not present

## 2023-07-09 DIAGNOSIS — I10 Essential (primary) hypertension: Secondary | ICD-10-CM

## 2023-07-09 NOTE — Progress Notes (Signed)
[facility] Clinical Social Work  Clinical Social Work was referred by Statistician for assessment of psychosocial needs.  Clinical Social Worker met with patient to offer support and assess for needs. Patient expressed desire for increased emotional support. CSW will contact patient to discuss further.

## 2023-07-09 NOTE — Progress Notes (Signed)
[facility] New Patient Consult   Requesting MD: Almond Lint, Md [address] [place],  [place] [address]   RISHAB STOUDT 53 y.o.  12/14/1970    Reason for Consult: Carcinoid tumor of the ileum   HPI: Mr. Cuff underwent a screening colonoscopy on 03/11/2023.  Localized nonbleeding erosions were noted at the terminal ileum.  Nodular mucosa was noted in the terminal ileum.  Biopsies were obtained.  Polyps were removed from the ascending colon and rectum.  The biopsy from the ileal nodule revealed a well-differentiated neuroendocrine tumor extending to the tissue edge.  The colon polyps returned as a hyperplastic polyp.  Negative for dysplasia.  Mr Jacome was referred to Dr. Donell Beers.  A dotatate PET that on 04/05/2023 revealed mild activity involving a 0.1 cm right axillary node.  0.9 cm right lower lobe nodule was without increased uptake.  A focal area of increased activity was noted ileocecal valve corresponding to a 1.5 cm nodular density.  No evidence of liver metastases.  A left external iliac node measured 1.4 cm with an SUV of 5.8.  Several left inguinal nodes had increased activity including a clean glomera ration of nodes with an SUV of 6.51.  He underwent ultrasound-guided biopsy of a left inguinal node on 05/17/2023.  The pathology revealed reactive lymphoid hyperplasia.  He was taken to the operating room for a laparoscopic right hemicolectomy on 05/28/2023.  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  1 of 17 lymph nodes contained metastatic carcinoma.  The resection margins are negative.  The Ki-67 returned at 2%.  Mr Velis reports right abdominal soreness following surgery.  He was referred for a restaging dotatate PET on 07/02/2023.  There are postsurgical changes from the ileocolectomy.  Unchanged appearance of tracer avid left external iliac and left inguinal nodes  and a mild tracer avid right axillary node.  The 9 mm right lower lobe nodule is unchanged.  Additional lung nodules.  Unchanged.  Mr. Weed is referred for oncology evaluation.  His sister is present for today's visit by telephone.   Past Medical History:  Diagnosis Date   Diabetes mellitus without complication (HCC)    Family history of breast cancer    Family history of colon cancer    Family history of prostate cancer    GERD (gastroesophageal reflux disease)    High cholesterol    Hypertension    Neuroendocrine tumor 05/01/2023   RA (rheumatoid arthritis) (HCC)     .  Pulmonary embolism                             02/21/2022  Past Surgical History:  Procedure Laterality Date   PARTIAL COLECTOMY N/A 05/28/2023   Procedure: ILEOCOLECTOMY LAPAROSCOPIC;  Surgeon: Almond Lint, MD;  Location: [facility] OR;  Service: General;  Laterality: N/A;   TOE AMPUTATION     Right Pinky Toe    Medications: Reviewed  Allergies: No Known Allergies  Family history: A maternal uncle had "throat "cancer.  His sister had breast cancer and colon cancer.  Social History:   He lives with his mother in [place].  He is disabled secondary to rheumatoid arthritis.  He previously worked in an [employer], site.  He smokes 10 cigarettes/day.  No alcohol use.  No transfusion history.  No risk factor for HIV or hepatitis.  ROS:   Positives  include: Episodes of bleeding when wiping approximately every 6 months, pain and stiffness of the joints including the ankles, knees, wrists, and shoulders.,  Right abdominal soreness following the colectomy procedure  A complete ROS was otherwise negative.  Physical Exam:  Blood pressure (!) 132/90, pulse 93, temperature 97.9 F (36.6 C), temperature source Temporal, resp. rate 18, height 6' (1.829 m), weight 286 lb (129.7 kg), SpO2 100%.  HEENT: Oropharynx without visible mass, enlargement of the right compared to the left tonsil Lungs: Clear  bilaterally Cardiac: Regular rate and rhythm Abdomen: No hepatosplenomegaly, healed midline incision, no mass, nontender GU: Testes without mass Vascular: No leg edema Lymph nodes: No cervical, supraclavicular, axillary, or inguinal nodes Neurologic: Alert and oriented, the motor exam appears intact in the upper and lower extremities bilaterally Skin: No rash Musculoskeletal: Mild diffuse spine and posterior chest wall tenderness, edema at the left greater than right knee   LAB:  CBC  Lab Results  Component Value Date   WBC 8.1 06/01/2023   HGB 12.9 (L) 06/01/2023   HCT 37.1 (L) 06/01/2023   MCV 85.5 06/01/2023   PLT 266 06/01/2023   NEUTROABS 4.4 05/21/2023        CMP  Lab Results  Component Value Date   NA 132 (L) 06/01/2023   K 3.8 06/01/2023   CL 101 06/01/2023   CO2 24 06/01/2023   GLUCOSE 113 (H) 06/01/2023   BUN 27 (H) 06/01/2023   CREATININE 1.60 (H) 06/01/2023   CALCIUM 8.1 (L) 06/01/2023   PROT 7.7 05/21/2023   ALBUMIN 3.4 (L) 05/21/2023   AST 16 05/21/2023   ALT 16 05/21/2023   ALKPHOS 106 05/21/2023   BILITOT 0.7 05/21/2023   GFRNONAA 51 (L) 06/01/2023   GFRAA >60 10/12/2019     No results found for: "CEA1", "ZOX096", "CA125"  Imaging:  PET images from 04/05/2023 and 07/02/2023 reviewed with Mr. 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  Increased tracer uptake in a  left external iliac and left inguinal nodes.  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  The tumor was found incidentally.  He underwent surgical resection and there was 1 positive lymph node.  He has a good prognosis.  There is no indication for adjuvant systemic therapy.  The baseline chromogranin a level was normal.  He will return for an office visit and chromogranin A level in 6 months.  The tracer avid lymph nodes in the right axilla and pelvis are likely benign finding.  Biopsy of a left inguinal node revealed reactive lymphoid hyperplasia.  He will be scheduled for a dotatate PET in 1 year.  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  I recommended he follow-up with the genetics counselor.  His siblings and any children should be screened for this mutation.  His sister was present for today's visit by telephone.  He continues apixaban anticoagulation for the pulmonary embolism diagnosed in 2023.  Anticoagulation therapy is managed by his primary provider.  He continues follow-up with rheumatology for management of rheumatoid arthritis.  I do not relate the carcinoid tumor diagnosis to the rheumatoid arthritis or his medical regimen.  His case will be  presented at the GI tumor conference. Thornton Papas, MD  07/09/2023, 4:37 PM

## 2023-07-09 NOTE — Progress Notes (Signed)
PATIENT NAVIGATOR PROGRESS NOTE  Name: Jeremiah Alexander Date: 07/09/2023 MRN: [id number]  DOB: 1970/08/02   Reason for visit:  New Patient appt   Comments:  Met with Jeremiah Alexander during visit with Dr Alcide Evener  Excellent prognosis with neuroendocrine carcinoid tumor and Dr Truett Perna does not recommend treatment. He recommends surveillance Jeremiah Alexander will return to clinic in 6 months for Chromogranin A blood work as well as office visit At that appt will set up PET Dototate scan at 1 year Referral to SW and introduction of [employer].  Visit will be set up later this week. Food insecurity identified and food bag given.  During his appt with SW he will discuss SSI and SNAP program.  Given contact information and disease specific information and encouraged to call with any additional needs    Time spent counseling/coordinating care: > 60 minutes

## 2023-07-12 ENCOUNTER — Other Ambulatory Visit: Payer: Self-pay

## 2023-07-15 ENCOUNTER — Other Ambulatory Visit: Payer: Self-pay | Admitting: Internal Medicine

## 2023-07-15 ENCOUNTER — Other Ambulatory Visit: Payer: Self-pay | Admitting: Pharmacy Technician

## 2023-07-15 ENCOUNTER — Other Ambulatory Visit: Payer: Self-pay

## 2023-07-15 DIAGNOSIS — M059 Rheumatoid arthritis with rheumatoid factor, unspecified: Secondary | ICD-10-CM

## 2023-07-15 NOTE — Progress Notes (Signed)
 Specialty Pharmacy Refill Coordination Note  Jeremiah Alexander is a 53 y.o. male contacted today regarding refills of specialty medication(s) Etanercept (Enbrel Mini)   Patient requested Delivery   Delivery date: 07/23/23   Verified address: [address] [place]   Medication will be filled on 07/22/23.  RR sent to MD

## 2023-07-16 ENCOUNTER — Other Ambulatory Visit: Payer: Self-pay

## 2023-07-16 ENCOUNTER — Other Ambulatory Visit (HOSPITAL_COMMUNITY): Payer: Self-pay

## 2023-07-16 ENCOUNTER — Other Ambulatory Visit: Payer: Self-pay | Admitting: Internal Medicine

## 2023-07-16 ENCOUNTER — Telehealth: Payer: Self-pay

## 2023-07-16 DIAGNOSIS — M059 Rheumatoid arthritis with rheumatoid factor, unspecified: Secondary | ICD-10-CM

## 2023-07-16 MED ORDER — METHOTREXATE SODIUM 2.5 MG PO TABS
20.0000 mg | ORAL_TABLET | ORAL | 0 refills | Status: AC
  Filled 2023-07-16: qty 32, 28d supply, fill #0
  Filled 2023-10-29: qty 32, 28d supply, fill #1

## 2023-07-16 MED ORDER — ENBREL MINI 50 MG/ML ~~LOC~~ SOCT
50.0000 mg | SUBCUTANEOUS | 0 refills | Status: AC
Start: 2023-07-16 — End: ?
  Filled 2023-07-16: qty 4, 28d supply, fill #0

## 2023-07-16 MED ORDER — APIXABAN 5 MG PO TABS
5.0000 mg | ORAL_TABLET | Freq: Two times a day (BID) | ORAL | 1 refills | Status: AC
  Filled 2023-07-16: qty 180, 90d supply, fill #0
  Filled 2023-10-29: qty 180, 90d supply, fill #1

## 2023-07-16 NOTE — Telephone Encounter (Signed)
 [facility] CSW Progress Note  Patient left CSW a vm canceling 2/26 appointment due to transportation. CSW returned patient's call. Patient expressed limited non-emergency based transportation rides through insurance. Patient expressed desire to call back once transportation is settled. Patient has direct contact information.  Marguerita Merles, LCSW Clinical Social Worker [facility]

## 2023-07-16 NOTE — Telephone Encounter (Signed)
 Last Fill: Enbrel 06/12/2023 Methotrexate 01/18/2023  Labs: 06/01/2023 Sodium 132 Glucose 113 BUN 27 Creatinine 1.60 Calcium 8.1 GFR 51 Hemoglobin 12.9 HCT 37.1  TB Gold: 05/31/2022 Negative   Next Visit: 07/23/2023  Last Visit: 01/17/2023  [id number] rheumatoid arthritis (HCC)   Current Dose per office note 01/17/2023: Enbrel 50 mg subcu weekly methotrexate 15 mg p.o. weekly   Methotrexate dose increased in lab note from 01/17/2023.   Contacted the patient and advised TB Gold test is due and that he needs to schedule a follow up appointment. Scheduled with the patient an appointment on 07/23/2023 and advised the patient he should be able to get the TB Gold test drawn next week at that appointment.   Okay to refill Enbrel and Methotrexate?

## 2023-07-17 ENCOUNTER — Other Ambulatory Visit (HOSPITAL_COMMUNITY): Payer: Self-pay

## 2023-07-17 ENCOUNTER — Other Ambulatory Visit: Payer: Self-pay | Admitting: *Deleted

## 2023-07-17 ENCOUNTER — Other Ambulatory Visit: Payer: Medicaid Other

## 2023-07-17 MED ORDER — OXYCODONE HCL 5 MG PO TABS
5.0000 mg | ORAL_TABLET | Freq: Four times a day (QID) | ORAL | 0 refills | Status: AC | PRN
  Filled 2023-07-17: qty 20, 5d supply, fill #0
  Filled 2023-10-29: qty 10, 3d supply, fill #1

## 2023-07-17 NOTE — Progress Notes (Signed)
 The proposed treatment discussed in conference is for discussion purpose only and is not a binding recommendation.  The patients have not been physically examined, or presented with their treatment options.  Therefore, final treatment plans cannot be decided.

## 2023-07-19 ENCOUNTER — Other Ambulatory Visit (HOSPITAL_COMMUNITY): Payer: Self-pay

## 2023-07-19 MED ORDER — DIBUCAINE (PERIANAL) 1 % EX OINT
TOPICAL_OINTMENT | Freq: Four times a day (QID) | CUTANEOUS | 3 refills | Status: AC
  Filled 2023-07-19 – 2023-10-29 (×2): qty 56, 10d supply, fill #0

## 2023-07-19 MED ORDER — HYDROCORTISONE ACETATE 25 MG RE SUPP
25.0000 mg | Freq: Two times a day (BID) | RECTAL | 2 refills | Status: AC
  Filled 2023-07-19 – 2023-10-29 (×2): qty 20, 10d supply, fill #0

## 2023-07-22 ENCOUNTER — Other Ambulatory Visit (HOSPITAL_COMMUNITY): Payer: Self-pay

## 2023-07-23 ENCOUNTER — Ambulatory Visit: Payer: Medicaid Other | Admitting: Internal Medicine

## 2023-07-23 NOTE — Progress Notes (Deleted)
 Office Visit Note  Patient: Jeremiah Alexander             Date of Birth: 1970-09-10           MRN: [id number]             PCP: Kathleen Lime, MD Referring: Kathleen Lime, MD Visit Date: 07/23/2023   Subjective:  No chief complaint on file.   History of Present Illness: Jeremiah Alexander is a 53 y.o. male here for follow up ***   Previous HPI 01/17/23 Jeremiah Alexander is a 53 y.o. male here for follow up for seropositive RA on Enbrel 50 mg subcu weekly and hydroxychloroquine 400 mg daily prednisone 5 mg daily since last visit resuming the methotrexate 15 mg p.o. weekly folic acid 1 mg daily.  Despite this he continues with significant joint pain and stiffness currently most problematic areas in his right wrist and left ankle.  He has increased pain and swelling after walking or putting pressure onto his left ankle.  Is also describing increased trouble rising from a seated position has to use his hands for assistance due to weakness.  Has to watch his feet while walking as his ankle feels unstable and painful if he steps on any uneven ground.  Denies left leg numbness or pain radiating in the back of the leg.  He does have neuropathy not on specific medications for this.  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   Previous HPI 10/16/2022 Jeremiah Alexander is a 53 y.o. male here for follow up for seropositive RA on Enbrel 50 mg subcu weekly and hydroxychloroquine 400 mg daily and prednisone 5 mg daily.  We discussed resuming his previous methotrexate treatment after last visit due to ongoing joint pain but he is not actually on the medication mostly due to financial and transportation issue.  Most of his joint pain and swelling have been doing better and knee pain is improved after steroid injections at last visit.  For the past 1 to 2 weeks though left ankle pain increased has pain with pressure  movement and with weightbearing.  He did not recall any preceding injury or change in activity and has not been sick.  Doing slightly better now compared to a week ago he is not sure whether there is any additional swelling.   Previous HPI 08/14/22 Jeremiah Alexander is a 53 y.o. male here for follow up for seropositive RA on hydroxychloroquine 400 mg daily prednisone 5 mg daily after restarting Enbrel 50 mg subcu weekly after our visit in January.  He has seen an improvement in joint pain and stiffness symptoms.  Describes this as more relief than he has had on any previous treatment but still does not have him near his normal baseline. Knees still remain quite active and limiting his mobility. Symptoms are most severe involving his knees and in the right foot and ankle with some persistent swelling.  He has not noticed any serious side effects with the Enbrel injections.  He has had some increased achiness at the back of his scalp had some small scab or spots with excoriations but no visible rash.  Not seeing any rashes elsewhere.   Previous HPI 05/31/22 Jeremiah Alexander is a 53 y.o. male here for seropositive rheumatoid arthritis.  He is currently taking hydroxychloroquine 400 mg daily and prednisone 5  mg daily.  He was initially diagnosed with rheumatoid arthritis last year at [facility] in [place].  Initial symptom onset was since at least about 2 years ago with gradually progressing worsening pain and swelling and weakness affecting his right hand and wrist.  Then involving both sides and subsequently proceeded to have worsening pain and swelling affecting both of his knees and feet as well.  Currently knee pain and swelling is his most problematic symptom and has severely reduced his mobility.  Laboratory findings showed highly positive rheumatoid factor and a low positive anti-CCP antibodies.  He was also started on methotrexate that he took for only a short time seems to have been discontinued just  due to lack of refill or follow-up rather than from any particular intolerance or contraindication.  However never had dramatic symptomatic improvement.  He was also recommended to start Enbrel but initially this was stopped after losing his medical insurance. He was hospitalized with acute pulmonary embolism last year thought most likely coming from his uncontrolled inflammatory arthritis and immobility with no previous history of blood clots or coagulation disorder.  He is now on indefinite Eliquis anticoagulation. He continues to smoke cigarettes on some days with >20 year total history.   Labs reviewed RF 187 CCP 39   DMARD Hx Prednisone - current Methotrexate - Short duration in 2023 unintentionally stopped Plaquenil - current Enbrel - planned   No Rheumatology ROS completed.   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   HLD (hyperlipidemia) 05/31/2022   Infection of the upper respiratory tract 05/31/2022   Seropositive rheumatoid arthritis (HCC) 03/09/2022   Preventative health care 03/09/2022   Pulmonary embolism without acute cor pulmonale (HCC) 02/22/2022   Pulmonary embolism (HCC) 02/22/2022   TOBACCO ABUSE 06/05/2010   Diabetes (HCC) 05/08/2010   OBESITY 05/08/2010   HYPERTENSION, BENIGN ESSENTIAL 05/08/2010    Past Medical History:  Diagnosis Date   Diabetes mellitus  without complication (HCC)    Family history of breast cancer    Family history of colon cancer    Family history of prostate cancer    GERD (gastroesophageal reflux disease)    High cholesterol    Hypertension    Neuroendocrine tumor 05/01/2023   RA (rheumatoid arthritis) (HCC)     Family History  Problem Relation Age of Onset   Rheum arthritis Mother    Breast cancer Maternal Aunt        dx. > 50   Throat cancer Maternal Uncle        smoker   Prostate cancer Maternal Uncle    Prostate cancer Maternal Uncle    Breast cancer Maternal Grandmother    Diabetes Other    Breast cancer Other        MGF's sister dx over 65   Breast cancer Half-Sister 94   Colon cancer Half-Sister 21   Breast  cancer Maternal Great-grandmother        MGF's mother dx over 57   Esophageal cancer Neg Hx    Rectal cancer Neg Hx    Stomach cancer Neg Hx    Past Surgical History:  Procedure Laterality Date   PARTIAL COLECTOMY N/A 05/28/2023   Procedure: ILEOCOLECTOMY LAPAROSCOPIC;  Surgeon: Almond Lint, MD;  Location: [facility] OR;  Service: General;  Laterality: N/A;   TOE AMPUTATION     Right Pinky Toe   Social History   Social History Narrative   Not on file   Immunization History  Administered Date(s) Administered   Influenza Whole 04/12/2010   Tdap 03/09/2022     Objective: Vital Signs: There were no vitals taken for this visit.   Physical Exam   Musculoskeletal Exam: ***  CDAI Exam: CDAI Score: -- Patient Global: --; Provider Global: -- Swollen: --; Tender: -- Joint Exam 07/23/2023   No joint exam has been documented for this visit   There is currently no information documented on the homunculus. Go to the Rheumatology activity and complete the homunculus joint exam.  Investigation: No additional findings.  Imaging: NM PET DOTATATE SKULL BASE TO MID THIGH Result Date: 07/03/2023 CLINICAL DATA:  Subsequent treatment strategy for neuroendocrine tumor of the ileum. EXAM: NUCLEAR  MEDICINE PET SKULL BASE TO THIGH TECHNIQUE: 4.34 mCi Cu 64 DOTATATE was injected intravenously. Full-ring PET imaging was performed from the skull base to thigh after the radiotracer. CT data was obtained and used for attenuation correction and anatomic localization. COMPARISON:  04/05/2023 FINDINGS: NECK No radiotracer activity in neck lymph nodes. Incidental CT findings: None CHEST No radiotracer accumulation within mediastinal or hilar lymph nodes. No suspicious pulmonary nodules on the CT scan. Similar appearance of mild tracer uptake within right axillary lymph nodes. Index lymph node measures 0.9 cm with SUV max of 4.50, image 67/4. On the previous exam 1.1 cm with SUV max of 4.22. No tracer avid mediastinal or hilar lymph nodes. Small scattered lung nodules are again identified: This includes: -dominant nodule within the medial right lower lobe measures 9 mm without significant tracer uptake (SUV max equals 1.68). This is unchanged compared with the previous exam. -3 mm right upper lobe lung nodule (technically too small to characterize by PET-CT) is unchanged, image 37/7. -Additional small lung nodules appears similar to the previous exam Incidental CT finding:No acute findings. Aortic atherosclerosis and coronary artery calcifications. ABDOMEN/PELVIS Postsurgical changes from interval ileocolectomy. No signs of residual tracer avid disease. Again seen are tracer avid left external iliac and left inguinal lymph nodes, including: -index left external iliac lymph node measures 1.4 cm with SUV max of 5.38, image 186/4. Previously 1.4 cm with SUV max of 5.80. -Index conglomeration of lymph nodes in the left inguinal region measures 3.7 x 1.3 cm with SUV max of 6.33, image 222/4. Previously 4.1 x 1.4 cm with SUV max of 6.51. Physiologic activity noted in the liver, spleen, adrenal glands and kidneys. Incidental CT findings:Gallstone. No ascites. Aortic atherosclerosis. SKELETON No focal activity to suggest  skeletal metastasis. Incidental CT findings:None IMPRESSION: 1. Postsurgical changes from interval ileocolectomy. No signs of residual tracer avid disease. 2. Unchanged appearance of tracer avid left external iliac and left inguinal lymph nodes and mild tracer avid right axillary lymph nodes. 3. Stable appearance of small scattered lung nodules. The largest nodule within the medial right lower lobe measures 9 mm without significant tracer uptake. 4. Aortic Atherosclerosis (ICD10-I70.0). Electronically Signed   By: Signa Kell  M.D.   On: 07/03/2023 08:35    Recent Labs: Lab Results  Component Value Date   WBC 8.1 06/01/2023   HGB 12.9 (L) 06/01/2023   PLT 266 06/01/2023   NA 132 (L) 06/01/2023   K 3.8 06/01/2023   CL 101 06/01/2023   CO2 24 06/01/2023   GLUCOSE 113 (H) 06/01/2023   BUN 27 (H) 06/01/2023   CREATININE 1.60 (H) 06/01/2023   BILITOT 0.7 05/21/2023   ALKPHOS 106 05/21/2023   AST 16 05/21/2023   ALT 16 05/21/2023   PROT 7.7 05/21/2023   ALBUMIN 3.4 (L) 05/21/2023   CALCIUM 8.1 (L) 06/01/2023   GFRAA >60 10/12/2019   QFTBGOLDPLUS NEGATIVE 05/31/2022    Speciality Comments: Enbrel started 05/31/2022  PLQ Eye Exam: Patient states he recently had an eye exam done at [facility] and will have them fax [place] the results.   Procedures:  No procedures performed Allergies: Patient has no known allergies.   Assessment / Plan:     Visit Diagnoses: No diagnosis found.  ***  Orders: No orders of the defined types were placed in this encounter.  No orders of the defined types were placed in this encounter.    Follow-Up Instructions: No follow-ups on file.   Fuller Plan, MD  Note - This record has been created using [employer].  Chart creation errors have been sought, but may not always  have been located. Such creation errors do not reflect on  the standard of medical care.

## 2023-07-25 ENCOUNTER — Other Ambulatory Visit (HOSPITAL_COMMUNITY): Payer: Self-pay

## 2023-08-01 ENCOUNTER — Other Ambulatory Visit (HOSPITAL_COMMUNITY): Payer: Self-pay

## 2023-08-06 ENCOUNTER — Other Ambulatory Visit: Payer: Self-pay

## 2023-08-16 ENCOUNTER — Other Ambulatory Visit: Payer: Self-pay

## 2023-08-19 ENCOUNTER — Other Ambulatory Visit: Payer: Self-pay

## 2023-08-20 DIAGNOSIS — K625 Hemorrhage of anus and rectum: Secondary | ICD-10-CM | POA: Diagnosis not present

## 2023-08-20 DIAGNOSIS — K6289 Other specified diseases of anus and rectum: Secondary | ICD-10-CM | POA: Diagnosis not present

## 2023-08-20 DIAGNOSIS — K641 Second degree hemorrhoids: Secondary | ICD-10-CM | POA: Diagnosis not present

## 2023-09-04 NOTE — Progress Notes (Deleted)
 Office Visit Note  Patient: Jeremiah Alexander             Date of Birth: 07/03/1970           MRN: [id number]             PCP: Kathleen Lime, MD Referring: Kathleen Lime, MD Visit Date: 09/13/2023   Subjective:  No chief complaint on file.   History of Present Illness: Jeremiah Alexander is a 53 y.o. male here for follow up for seropositive RA on Enbrel 50 mg subcu weekly, hydroxychloroquine 400 mg daily, prednisone 5 mg daily, methotrexate 20 mg p.o. weekly, and folic acid 1 mg daily.  Previous HPI 01/17/2023 Jeremiah Alexander is a 53 y.o. male here for follow up for seropositive RA on Enbrel 50 mg subcu weekly and hydroxychloroquine 400 mg daily prednisone 5 mg daily since last visit resuming the methotrexate 15 mg p.o. weekly folic acid 1 mg daily.  Despite this he continues with significant joint pain and stiffness currently most problematic areas in his right wrist and left ankle.  He has increased pain and swelling after walking or putting pressure onto his left ankle.  Is also describing increased trouble rising from a seated position has to use his hands for assistance due to weakness.  Has to watch his feet while walking as his ankle feels unstable and painful if he steps on any uneven ground.  Denies left leg numbness or pain radiating in the back of the leg.  He does have neuropathy not on specific medications for this.  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   Previous HPI 10/16/2022 Jeremiah Alexander is a 53 y.o. male here for follow up for seropositive RA on Enbrel 50 mg subcu weekly and hydroxychloroquine 400 mg daily and prednisone 5 mg daily.  We discussed resuming his previous methotrexate treatment after last visit due to ongoing joint pain but he is not actually on the medication mostly due to financial and transportation issue.  Most of his joint pain and swelling have been doing  better and knee pain is improved after steroid injections at last visit.  For the past 1 to 2 weeks though left ankle pain increased has pain with pressure movement and with weightbearing.  He did not recall any preceding injury or change in activity and has not been sick.  Doing slightly better now compared to a week ago he is not sure whether there is any additional swelling.   Previous HPI 08/14/22 Jeremiah Alexander is a 53 y.o. male here for follow up for seropositive RA on hydroxychloroquine 400 mg daily prednisone 5 mg daily after restarting Enbrel 50 mg subcu weekly after our visit in January.  He has seen an improvement in joint pain and stiffness symptoms.  Describes this as more relief than he has had on any previous treatment but still does not have him near his normal baseline. Knees still remain quite active and limiting his mobility. Symptoms are most severe involving his knees and in the right foot and ankle with some persistent swelling.  He has not noticed any serious side effects with the Enbrel injections.  He has had some increased achiness at the back of his scalp had some small scab or spots with excoriations but no visible rash.  Not seeing any rashes elsewhere.   Previous HPI  05/31/22 Jeremiah Alexander is a 54 y.o. male here for seropositive rheumatoid arthritis.  He is currently taking hydroxychloroquine 400 mg daily and prednisone 5 mg daily.  He was initially diagnosed with rheumatoid arthritis last year at [facility] in [place].  Initial symptom onset was since at least about 2 years ago with gradually progressing worsening pain and swelling and weakness affecting his right hand and wrist.  Then involving both sides and subsequently proceeded to have worsening pain and swelling affecting both of his knees and feet as well.  Currently knee pain and swelling is his most problematic symptom and has severely reduced his mobility.  Laboratory findings showed highly positive rheumatoid  factor and a low positive anti-CCP antibodies.  He was also started on methotrexate that he took for only a short time seems to have been discontinued just due to lack of refill or follow-up rather than from any particular intolerance or contraindication.  However never had dramatic symptomatic improvement.  He was also recommended to start Enbrel but initially this was stopped after losing his medical insurance. He was hospitalized with acute pulmonary embolism last year thought most likely coming from his uncontrolled inflammatory arthritis and immobility with no previous history of blood clots or coagulation disorder.  He is now on indefinite Eliquis anticoagulation. He continues to smoke cigarettes on some days with >20 year total history.   Labs reviewed RF 187 CCP 39   DMARD Hx Prednisone - current Methotrexate - Short duration in 2023 unintentionally stopped Plaquenil - current Enbrel - planned   No Rheumatology ROS completed.   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   HLD (hyperlipidemia) 05/31/2022   Infection of the upper respiratory tract 05/31/2022   Seropositive rheumatoid arthritis (HCC) 03/09/2022   Preventative health care 03/09/2022   Pulmonary embolism without acute cor pulmonale (HCC) 02/22/2022   Pulmonary embolism (HCC) 02/22/2022   TOBACCO ABUSE 06/05/2010    Diabetes (HCC) 05/08/2010   OBESITY 05/08/2010   HYPERTENSION, BENIGN ESSENTIAL 05/08/2010    Past Medical History:  Diagnosis Date   Diabetes mellitus without complication (HCC)    Family history of breast cancer    Family history of colon cancer    Family history of prostate cancer    GERD (gastroesophageal reflux disease)    High cholesterol    Hypertension    Neuroendocrine tumor 05/01/2023   RA (rheumatoid arthritis) (HCC)     Family History  Problem Relation Age of Onset   Rheum arthritis Mother    Breast cancer Maternal Aunt        dx. > 50   Throat cancer Maternal Uncle        smoker   Prostate cancer Maternal Uncle    Prostate cancer Maternal Uncle    Breast cancer Maternal Grandmother    Diabetes Other    Breast cancer Other  MGF's sister dx over 88   Breast cancer Half-Sister 50   Colon cancer Half-Sister 48   Breast cancer Maternal Great-grandmother        MGF's mother dx over 60   Esophageal cancer Neg Hx    Rectal cancer Neg Hx    Stomach cancer Neg Hx    Past Surgical History:  Procedure Laterality Date   PARTIAL COLECTOMY N/A 05/28/2023   Procedure: ILEOCOLECTOMY LAPAROSCOPIC;  Surgeon: Lockie Rima, MD;  Location: [facility] OR;  Service: General;  Laterality: N/A;   TOE AMPUTATION     Right Pinky Toe   Social History   Social History Narrative   Not on file   Immunization History  Administered Date(s) Administered   Influenza Whole 04/12/2010   Tdap 03/09/2022     Objective: Vital Signs: There were no vitals taken for this visit.   Physical Exam   Musculoskeletal Exam: ***  CDAI Exam: CDAI Score: -- Patient Global: --; Provider Global: -- Swollen: --; Tender: -- Joint Exam 09/13/2023   No joint exam has been documented for this visit   There is currently no information documented on the homunculus. Go to the Rheumatology activity and complete the homunculus joint exam.  Investigation: No additional findings.  Imaging: No  results found.  Recent Labs: Lab Results  Component Value Date   WBC 8.1 06/01/2023   HGB 12.9 (L) 06/01/2023   PLT 266 06/01/2023   NA 132 (L) 06/01/2023   K 3.8 06/01/2023   CL 101 06/01/2023   CO2 24 06/01/2023   GLUCOSE 113 (H) 06/01/2023   BUN 27 (H) 06/01/2023   CREATININE 1.60 (H) 06/01/2023   BILITOT 0.7 05/21/2023   ALKPHOS 106 05/21/2023   AST 16 05/21/2023   ALT 16 05/21/2023   PROT 7.7 05/21/2023   ALBUMIN 3.4 (L) 05/21/2023   CALCIUM 8.1 (L) 06/01/2023   GFRAA >60 10/12/2019   QFTBGOLDPLUS NEGATIVE 05/31/2022    Speciality Comments: Enbrel started 05/31/2022  PLQ Eye Exam: Patient states he recently had an eye exam done at [facility] and will have them fax us  the results.   Procedures:  No procedures performed Allergies: Patient has no known allergies.   Assessment / Plan:     Visit Diagnoses: No diagnosis found.  ***  Orders: No orders of the defined types were placed in this encounter.  No orders of the defined types were placed in this encounter.    Follow-Up Instructions: No follow-ups on file.   Glena Landau, RT  Note - This record has been created using [employer].  Chart creation errors have been sought, but may not always  have been located. Such creation errors do not reflect on  the standard of medical care.

## 2023-09-13 ENCOUNTER — Ambulatory Visit: Admitting: Internal Medicine

## 2023-09-13 DIAGNOSIS — Z79899 Other long term (current) drug therapy: Secondary | ICD-10-CM

## 2023-09-13 DIAGNOSIS — M25572 Pain in left ankle and joints of left foot: Secondary | ICD-10-CM

## 2023-09-13 DIAGNOSIS — I10 Essential (primary) hypertension: Secondary | ICD-10-CM

## 2023-09-13 DIAGNOSIS — Z7952 Long term (current) use of systemic steroids: Secondary | ICD-10-CM

## 2023-09-13 DIAGNOSIS — M059 Rheumatoid arthritis with rheumatoid factor, unspecified: Secondary | ICD-10-CM

## 2023-09-18 ENCOUNTER — Other Ambulatory Visit: Payer: Self-pay | Admitting: Student

## 2023-09-18 ENCOUNTER — Other Ambulatory Visit (HOSPITAL_COMMUNITY): Payer: Self-pay

## 2023-09-18 ENCOUNTER — Other Ambulatory Visit: Payer: Self-pay | Admitting: Internal Medicine

## 2023-09-18 ENCOUNTER — Other Ambulatory Visit: Payer: Self-pay

## 2023-09-18 DIAGNOSIS — M059 Rheumatoid arthritis with rheumatoid factor, unspecified: Secondary | ICD-10-CM

## 2023-09-19 ENCOUNTER — Encounter (HOSPITAL_COMMUNITY): Payer: Self-pay

## 2023-09-19 ENCOUNTER — Other Ambulatory Visit (HOSPITAL_COMMUNITY): Payer: Self-pay

## 2023-09-19 NOTE — Telephone Encounter (Signed)
 Last Fill: 01/17/2023 Prednisone  08/15/2022 Folic Acid   Next Visit: Due 04/19/2023. Message sent to the front to schedule.   Last Visit: 01/17/2023  Dx: Seropositive rheumatoid arthritis (HCC)   Current Dose per office note on 01/17/2023: prednisone  5 mg daily folic acid  1 mg daily   Okay to refill Prednisone  and Folic Acid ?

## 2023-09-19 NOTE — Telephone Encounter (Signed)
 LMOM for patient to call and schedule follow-up appoinment.

## 2023-09-19 NOTE — Telephone Encounter (Signed)
 Patient last seen 10/26/22, I called the patient to schedule a follow up appointment. Unable to reach the patient, I lvm for him to give us  a call back.

## 2023-09-19 NOTE — Telephone Encounter (Signed)
 Please schedule patient a follow up visit. Patient due 04/19/2023. Thanks!

## 2023-09-20 ENCOUNTER — Other Ambulatory Visit (HOSPITAL_COMMUNITY): Payer: Self-pay

## 2023-09-20 ENCOUNTER — Other Ambulatory Visit: Payer: Self-pay

## 2023-09-20 MED ORDER — PREDNISONE 5 MG PO TABS
5.0000 mg | ORAL_TABLET | Freq: Every day | ORAL | 0 refills | Status: AC
  Filled 2023-09-20: qty 30, 30d supply, fill #0

## 2023-09-20 MED ORDER — FOLIC ACID 1 MG PO TABS
1.0000 mg | ORAL_TABLET | Freq: Every day | ORAL | 0 refills | Status: AC
  Filled 2023-09-20: qty 30, 30d supply, fill #0
  Filled 2023-10-29: qty 30, 30d supply, fill #1

## 2023-09-25 ENCOUNTER — Other Ambulatory Visit (HOSPITAL_COMMUNITY): Payer: Self-pay

## 2023-09-30 ENCOUNTER — Other Ambulatory Visit (HOSPITAL_COMMUNITY): Payer: Self-pay

## 2023-10-09 ENCOUNTER — Other Ambulatory Visit: Payer: Self-pay

## 2023-10-29 ENCOUNTER — Other Ambulatory Visit: Payer: Self-pay | Admitting: Internal Medicine

## 2023-10-29 ENCOUNTER — Other Ambulatory Visit: Payer: Self-pay

## 2023-10-29 ENCOUNTER — Other Ambulatory Visit: Payer: Self-pay | Admitting: Student

## 2023-10-29 DIAGNOSIS — E119 Type 2 diabetes mellitus without complications: Secondary | ICD-10-CM

## 2023-10-29 DIAGNOSIS — M059 Rheumatoid arthritis with rheumatoid factor, unspecified: Secondary | ICD-10-CM

## 2023-10-30 ENCOUNTER — Encounter (HOSPITAL_COMMUNITY): Payer: Self-pay

## 2023-10-30 ENCOUNTER — Other Ambulatory Visit: Payer: Self-pay

## 2023-10-30 ENCOUNTER — Other Ambulatory Visit (HOSPITAL_COMMUNITY): Payer: Self-pay

## 2023-10-30 MED FILL — Lisinopril & Hydrochlorothiazide Tab 20-12.5 MG: ORAL | 90 days supply | Qty: 90 | Fill #0 | Status: CN

## 2023-10-30 NOTE — Telephone Encounter (Signed)
 Medication sent to pharmacy

## 2023-10-30 NOTE — Telephone Encounter (Signed)
 Patient is scheduled to come in on 6/30 with pcp.

## 2023-10-31 ENCOUNTER — Encounter (HOSPITAL_COMMUNITY): Payer: Self-pay

## 2023-10-31 ENCOUNTER — Other Ambulatory Visit (HOSPITAL_COMMUNITY): Payer: Self-pay

## 2023-11-07 ENCOUNTER — Other Ambulatory Visit (HOSPITAL_COMMUNITY): Payer: Self-pay

## 2023-11-11 ENCOUNTER — Other Ambulatory Visit (HOSPITAL_COMMUNITY): Payer: Self-pay

## 2023-11-12 ENCOUNTER — Other Ambulatory Visit (HOSPITAL_COMMUNITY): Payer: Self-pay

## 2023-11-18 ENCOUNTER — Encounter: Admitting: Student

## 2023-12-12 ENCOUNTER — Telehealth: Payer: Self-pay | Admitting: Oncology

## 2023-12-12 NOTE — Telephone Encounter (Signed)
 Left message with PT about changed appt time, same date.

## 2023-12-25 ENCOUNTER — Other Ambulatory Visit: Payer: Self-pay

## 2024-01-06 ENCOUNTER — Ambulatory Visit: Payer: Medicaid Other | Admitting: Oncology

## 2024-01-06 ENCOUNTER — Inpatient Hospital Stay: Admitting: Nurse Practitioner

## 2024-01-06 ENCOUNTER — Inpatient Hospital Stay

## 2024-01-06 ENCOUNTER — Other Ambulatory Visit: Payer: Medicaid Other

## 2024-01-15 ENCOUNTER — Other Ambulatory Visit: Payer: Self-pay

## 2024-02-12 ENCOUNTER — Other Ambulatory Visit (HOSPITAL_COMMUNITY): Payer: Self-pay

## 2024-02-24 ENCOUNTER — Other Ambulatory Visit: Payer: Self-pay
